# Patient Record
Sex: Male | Born: 1955
Health system: Southern US, Community
[De-identification: ages and names within clinical notes are randomized; demographics above are authoritative.]

## PROBLEM LIST (undated history)

## (undated) DIAGNOSIS — E785 Hyperlipidemia, unspecified: Secondary | ICD-10-CM

## (undated) DIAGNOSIS — C801 Malignant (primary) neoplasm, unspecified: Secondary | ICD-10-CM

## (undated) DIAGNOSIS — I2510 Atherosclerotic heart disease of native coronary artery without angina pectoris: Secondary | ICD-10-CM

## (undated) DIAGNOSIS — K219 Gastro-esophageal reflux disease without esophagitis: Secondary | ICD-10-CM

## (undated) DIAGNOSIS — I10 Essential (primary) hypertension: Secondary | ICD-10-CM

## (undated) DIAGNOSIS — Z720 Tobacco use: Secondary | ICD-10-CM

## (undated) DIAGNOSIS — D500 Iron deficiency anemia secondary to blood loss (chronic): Secondary | ICD-10-CM

## (undated) DIAGNOSIS — I214 Non-ST elevation (NSTEMI) myocardial infarction: Secondary | ICD-10-CM

## (undated) DIAGNOSIS — J45909 Unspecified asthma, uncomplicated: Secondary | ICD-10-CM

## (undated) HISTORY — DX: Tobacco use: Z72.0

## (undated) HISTORY — DX: Gastro-esophageal reflux disease without esophagitis: K21.9

## (undated) HISTORY — DX: Unspecified asthma, uncomplicated: J45.909

## (undated) HISTORY — PX: FOOT FRACTURE SURGERY: SHX645

## (undated) HISTORY — DX: Malignant (primary) neoplasm, unspecified: C80.1

---

## 2006-08-28 ENCOUNTER — Emergency Department: Payer: Self-pay | Admitting: Emergency Medicine

## 2010-12-16 ENCOUNTER — Emergency Department: Payer: Self-pay | Admitting: Emergency Medicine

## 2013-01-16 DEATH — deceased

## 2013-08-18 DEATH — deceased

## 2015-05-28 ENCOUNTER — Other Ambulatory Visit: Payer: Self-pay | Admitting: Otolaryngology

## 2015-05-28 ENCOUNTER — Ambulatory Visit
Admission: RE | Admit: 2015-05-28 | Discharge: 2015-05-28 | Disposition: A | Discharge status: Home / Self Care | Payer: BLUE CROSS/BLUE SHIELD | Source: Ambulatory Visit | Attending: Otolaryngology | Admitting: Otolaryngology

## 2015-05-28 DIAGNOSIS — R042 Hemoptysis: Secondary | ICD-10-CM

## 2015-05-31 ENCOUNTER — Ambulatory Visit: Payer: Self-pay | Admitting: Primary Care

## 2015-05-31 ENCOUNTER — Ambulatory Visit (INDEPENDENT_AMBULATORY_CARE_PROVIDER_SITE_OTHER): Payer: BLUE CROSS/BLUE SHIELD | Admitting: Primary Care

## 2015-05-31 ENCOUNTER — Encounter: Payer: Self-pay | Admitting: Primary Care

## 2015-05-31 VITALS — BP 130/82 | HR 80 | Temp 97.8°F | Ht 73.5 in | Wt 219.0 lb

## 2015-05-31 DIAGNOSIS — R7989 Other specified abnormal findings of blood chemistry: Secondary | ICD-10-CM

## 2015-05-31 DIAGNOSIS — K219 Gastro-esophageal reflux disease without esophagitis: Secondary | ICD-10-CM | POA: Diagnosis not present

## 2015-05-31 DIAGNOSIS — Z23 Encounter for immunization: Secondary | ICD-10-CM | POA: Diagnosis not present

## 2015-05-31 DIAGNOSIS — Z720 Tobacco use: Secondary | ICD-10-CM

## 2015-05-31 DIAGNOSIS — Z4659 Encounter for fitting and adjustment of other gastrointestinal appliance and device: Secondary | ICD-10-CM | POA: Insufficient documentation

## 2015-05-31 DIAGNOSIS — Z0000 Encounter for general adult medical examination without abnormal findings: Secondary | ICD-10-CM | POA: Diagnosis not present

## 2015-05-31 LAB — COMPREHENSIVE METABOLIC PANEL
ALT: 18 U/L (ref 0–53)
AST: 18 U/L (ref 0–37)
Albumin: 4.3 g/dL (ref 3.5–5.2)
Alkaline Phosphatase: 51 U/L (ref 39–117)
BILIRUBIN TOTAL: 0.7 mg/dL (ref 0.2–1.2)
BUN: 11 mg/dL (ref 6–23)
CALCIUM: 9.4 mg/dL (ref 8.4–10.5)
CHLORIDE: 102 meq/L (ref 96–112)
CO2: 27 meq/L (ref 19–32)
Creatinine, Ser: 0.76 mg/dL (ref 0.40–1.50)
GFR: 110.79 mL/min (ref >60.00)
GLUCOSE: 99 mg/dL (ref 70–99)
Potassium: 5 mEq/L (ref 3.5–5.1)
Sodium: 136 mEq/L (ref 135–145)
Total Protein: 7.3 g/dL (ref 6.0–8.3)

## 2015-05-31 LAB — CBC
HCT: 52.1 % — ABNORMAL HIGH (ref 39.0–52.0)
HEMOGLOBIN: 17.2 g/dL — AB (ref 13.0–17.0)
MCHC: 33.1 g/dL (ref 30.0–36.0)
MCV: 94.9 fl (ref 78.0–100.0)
PLATELETS: 244 10*3/uL (ref 150.0–400.0)
RBC: 5.49 Mil/uL (ref 4.22–5.81)
RDW: 14.2 % (ref 11.5–15.5)
WBC: 8.3 10*3/uL (ref 4.0–10.5)

## 2015-05-31 LAB — HEMOGLOBIN A1C: HEMOGLOBIN A1C: 5.8 % (ref 4.6–6.5)

## 2015-05-31 LAB — LIPID PANEL
Cholesterol: 209 mg/dL — ABNORMAL HIGH (ref 0–200)
HDL: 38.9 mg/dL — AB (ref >39.00)
NONHDL: 169.93
TRIGLYCERIDES: 238 mg/dL — AB (ref 0.0–149.0)
Total CHOL/HDL Ratio: 5
VLDL: 47.6 mg/dL — AB (ref 0.0–40.0)

## 2015-05-31 LAB — LDL CHOLESTEROL, DIRECT: LDL DIRECT: 143 mg/dL

## 2015-05-31 LAB — PSA: PSA: 0.69 ng/mL (ref 0.10–4.00)

## 2015-05-31 MED ORDER — INFLUENZA VAC SPLIT QUAD 0.5 ML IM SUSY
0.5000 mL | PREFILLED_SYRINGE | INTRAMUSCULAR | Status: AC
  Administered 2015-05-31: 0.5 mL via INTRAMUSCULAR

## 2015-05-31 NOTE — Assessment & Plan Note (Signed)
Form to be completed for employer. Labs today and pending. Exam unremarkable. Declines Zostavax and Colonoscopy. Flu provided today. Discussed healthy diet and exercise, including increasing water consumption.

## 2015-05-31 NOTE — Progress Notes (Signed)
Subjective:    Patient ID: Lawrence Wallace, male    DOB: Mar 02, 1956, 59 y.o.   MRN: [id number]  HPI  Lawrence Wallace is a 59 year old male who presents today to establish care and discuss the problems mentioned below. He also presents for complete physical including form completion for insurance purposes. Will review old records.  1) GERD: Present for 30+ years. He is managed on Nexium and without medication he will experience esophageal reflux with burning and epigastric pain.  2) Tobacco abuse: Smokes cigarettes. Smoked since age 30. Currently smokes 2 PPD. He's tried quitting "cold [place]" and using nicotine patches. He is ready to quit smoking and is interested in Chantix. He would like to wait to start Chantix until he's started acupuncture.   Immunizations: -Tetanus: Completed within 10 years. -Influenza: Due, will provide today -Shingles: Has never completed, declines today.  Diet: Endorses fair diet. Breakfast: Toast, tomato, bacon, fast food sometimes Lunch: Sandwich Dinner: Vegetables, lean meat, sometimes rice, potatoes. Beverages: Water ( 4 bottles of water), coffee Desserts: Will eat if around the house.  Exercise: Does not routinely exercise, is active with occupation Eye exam: Completed 6 months ago, slight change in vision Dental exam: Due for cleaning. Work done 2 months ago. Colonoscopy: Has never completed. Declines.    Review of Systems  Constitutional: Negative for unexpected weight change.  HENT: Negative for rhinorrhea.   Respiratory: Negative for cough and shortness of breath.   Cardiovascular: Negative for chest pain.  Gastrointestinal: Negative for diarrhea and constipation.  Genitourinary: Negative for difficulty urinating.  Musculoskeletal:       Chronic knee and foot pain. Occasional calf cramping  Skin: Negative for rash.  Neurological: Negative for dizziness, numbness and headaches.  Psychiatric/Behavioral:       Denies concerns for anxiety or  depression       Past Medical History  Diagnosis Date  . Asthma   . Cancer   . GERD (gastroesophageal reflux disease)     Social History   Social History  . Marital Status: Married    Spouse Name: N/A  . Number of Children: N/A  . Years of Education: N/A   Occupational History  . Not on file.   Social History Main Topics  . Smoking status: Current Every Day Smoker  . Smokeless tobacco: Not on file  . Alcohol Use: 0.0 oz/week    0 Standard drinks or equivalent per week  . Drug Use: Not on file  . Sexual Activity: Not on file   Other Topics Concern  . Not on file   Social History Narrative  . No narrative on file    Past Surgical History  Procedure Laterality Date  . Foot fracture surgery      Family History  Problem Relation Age of Onset  . Stroke Mother   . Cancer Mother   . Cancer Father     No Known Allergies  No current outpatient prescriptions on file prior to visit.   No current facility-administered medications on file prior to visit.    BP 130/82 mmHg  Pulse 80  Temp(Src) 97.8 F (36.6 C) (Oral)  Ht 6' 1.5" (1.867 m)  Wt 219 lb (99.338 kg)  BMI 28.50 kg/m2  SpO2 96%    Objective:   Physical Exam  Constitutional: He is oriented to person, place, and time. He appears well-nourished.  HENT:  Right Ear: Tympanic membrane and ear canal normal.  Left Ear: Tympanic membrane and ear canal normal.  Nose: Nose normal.  Mouth/Throat: Oropharynx is clear and moist.  Eyes: Conjunctivae and EOM are normal. Pupils are equal, round, and reactive to light.  Neck: Neck supple. No thyromegaly present.  Cardiovascular: Normal rate and regular rhythm.   Pulmonary/Chest: Effort normal and breath sounds normal.  Abdominal: Soft. Bowel sounds are normal. There is no tenderness.  Musculoskeletal: Normal range of motion.  Neurological: He is alert and oriented to person, place, and time. He has normal reflexes. No cranial nerve deficit.  Skin: Skin is  warm and dry.  Psychiatric: He has a normal mood and affect.          Assessment & Plan:

## 2015-05-31 NOTE — Progress Notes (Signed)
Pre visit review using our clinic review tool, if applicable. No additional management support is needed unless otherwise documented below in the visit note. 

## 2015-05-31 NOTE — Assessment & Plan Note (Signed)
Smokes 2 PPD currently. Smoking cigarettes since age 59. Ready to quit and is interested in Chantix but will notify me when he's ready for the RX as he will be doing acupuncture simultaneously.  Will continue to monitor.

## 2015-05-31 NOTE — Patient Instructions (Signed)
Complete lab work prior to leaving today. I will notify you of your results.  We will complete your form and notify you once the labs have resulted.   Increase consumption of water to 2 liters daily.   Please notify me once you're ready to start Chantix.  It was a pleasure to meet you today! Please don't hesitate to call me with any questions. Welcome to [facility]!

## 2015-05-31 NOTE — Assessment & Plan Note (Signed)
Present for 30+ years. Currently managed on nexium and is dependant on medication. Will continue to monitor.

## 2015-09-17 ENCOUNTER — Ambulatory Visit: Payer: BLUE CROSS/BLUE SHIELD | Admitting: Primary Care

## 2016-02-29 ENCOUNTER — Telehealth: Payer: Self-pay

## 2016-02-29 NOTE — Telephone Encounter (Signed)
Pt walked in; pt fell 02/24/16 but pt does not think hit chest in fall; on 02/26/16 pt noticed rt sided chest pain upon movement; pt moved furniture on 02/27/16 and rt sided chest pain upon movement worsened. Pt worked this morning but is off this afternoon. Pt has no pain when not moving; when pt moves the rt arm has rt sided cp. Does not radiate into neck or arm. No N&V,sweating or difficulty breathing. Pt is not in pain now. Pt is not in any distress. Pt only takes Nexium. No available appts at [facility] or [facility]. Pt does not want to go to UC. Pt scheduled appt with Dr Damita Dunnings on 03/01/16 at 2 pm. If pt condition changes or worsens prior to appt pt will go to UC. FYI to Dr Damita Dunnings.

## 2016-02-29 NOTE — Telephone Encounter (Signed)
Reasonable to try tylenol (and an ice pack if a locally sore place is noted).  Thanks.

## 2016-02-29 NOTE — Telephone Encounter (Signed)
Left detailed message on voicemail.  

## 2016-03-01 ENCOUNTER — Encounter: Payer: Self-pay | Admitting: Family Medicine

## 2016-03-01 ENCOUNTER — Ambulatory Visit (INDEPENDENT_AMBULATORY_CARE_PROVIDER_SITE_OTHER): Payer: BLUE CROSS/BLUE SHIELD | Admitting: Family Medicine

## 2016-03-01 VITALS — BP 124/80 | HR 90 | Temp 98.6°F | Ht 73.5 in | Wt 219.5 lb

## 2016-03-01 DIAGNOSIS — R0789 Other chest pain: Secondary | ICD-10-CM

## 2016-03-01 MED ORDER — TRAMADOL HCL 50 MG PO TABS: 50.0000 mg | ORAL_TABLET | Freq: Two times a day (BID) | ORAL | Status: DC | PRN

## 2016-03-01 NOTE — Progress Notes (Signed)
Pre visit review using our clinic review tool, if applicable. No additional management support is needed unless otherwise documented below in the visit note.  He is considering acupuncture to quit smoking.  He didn't tolerate the gum prev.    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  He was doing a lot of lifting.  Since then with R sided chest wall pain.  He didn't recall an injury.  Pain with sneezing.  No trauma. Pain with a cough.  No L sided pain.  Only with pain at the R pec area.  Pain is constant at baseline and worse with a cough.  Not SOB.  Taking ibuprofen didn't help.  Back isn't sore.  Feels well o/w.  He has position pain with reaching down and forward.   He is better now than 2 days ago.   Meds, vitals, and allergies reviewed.   ROS: Per HPI unless specifically indicated in ROS section   GEN: nad, alert and oriented NECK: supple w/o LA, normal ROM CV: rrr.  R chest wall ttp w/o bruising.   PULM: ctab, no inc wob EXT: no edema SKIN:no bruise

## 2016-03-01 NOTE — Patient Instructions (Addendum)
Use the tramadol as needed, on top of the ibuprofen (with food). Tramadol can make you drowsy.  Should gradually get better.  Go easy lifting.  Take care.  Glad to see you.

## 2016-03-01 NOTE — Assessment & Plan Note (Signed)
See AVS.  Likely chest wall/pec strain.  Tramadol prn, he can hold rx for now.  No sign of intrathoracic issues.   He's working to stop smoking, incidentally d/w pt.   Update me prn.

## 2016-06-01 DIAGNOSIS — D225 Melanocytic nevi of trunk: Secondary | ICD-10-CM | POA: Diagnosis not present

## 2016-06-01 DIAGNOSIS — L570 Actinic keratosis: Secondary | ICD-10-CM | POA: Diagnosis not present

## 2016-06-29 ENCOUNTER — Ambulatory Visit (INDEPENDENT_AMBULATORY_CARE_PROVIDER_SITE_OTHER): Payer: BLUE CROSS/BLUE SHIELD | Admitting: Primary Care

## 2016-06-29 ENCOUNTER — Encounter: Payer: Self-pay | Admitting: Primary Care

## 2016-06-29 VITALS — BP 138/82 | HR 82 | Temp 98.2°F | Ht 74.0 in | Wt 218.2 lb

## 2016-06-29 DIAGNOSIS — Z0000 Encounter for general adult medical examination without abnormal findings: Secondary | ICD-10-CM | POA: Diagnosis not present

## 2016-06-29 DIAGNOSIS — E785 Hyperlipidemia, unspecified: Secondary | ICD-10-CM | POA: Diagnosis not present

## 2016-06-29 DIAGNOSIS — Z23 Encounter for immunization: Secondary | ICD-10-CM | POA: Diagnosis not present

## 2016-06-29 DIAGNOSIS — R7303 Prediabetes: Secondary | ICD-10-CM

## 2016-06-29 DIAGNOSIS — K219 Gastro-esophageal reflux disease without esophagitis: Secondary | ICD-10-CM | POA: Diagnosis not present

## 2016-06-29 LAB — COMPREHENSIVE METABOLIC PANEL
ALK PHOS: 51 U/L (ref 39–117)
ALT: 20 U/L (ref 0–53)
AST: 20 U/L (ref 0–37)
Albumin: 4 g/dL (ref 3.5–5.2)
BILIRUBIN TOTAL: 0.8 mg/dL (ref 0.2–1.2)
BUN: 14 mg/dL (ref 6–23)
CO2: 28 mEq/L (ref 19–32)
Calcium: 9.4 mg/dL (ref 8.4–10.5)
Chloride: 104 mEq/L (ref 96–112)
Creatinine, Ser: 0.93 mg/dL (ref 0.40–1.50)
GFR: 87.46 mL/min (ref >60.00)
GLUCOSE: 108 mg/dL — AB (ref 70–99)
Potassium: 4.8 mEq/L (ref 3.5–5.1)
SODIUM: 137 meq/L (ref 135–145)
TOTAL PROTEIN: 7.4 g/dL (ref 6.0–8.3)

## 2016-06-29 LAB — LDL CHOLESTEROL, DIRECT: LDL DIRECT: 129 mg/dL

## 2016-06-29 LAB — LIPID PANEL
CHOL/HDL RATIO: 5
Cholesterol: 195 mg/dL (ref 0–200)
HDL: 36.6 mg/dL — AB (ref >39.00)
NONHDL: 158.32
Triglycerides: 291 mg/dL — ABNORMAL HIGH (ref 0.0–149.0)
VLDL: 58.2 mg/dL — ABNORMAL HIGH (ref 0.0–40.0)

## 2016-06-29 LAB — HEMOGLOBIN A1C: Hgb A1c MFr Bld: 5.7 % (ref 4.6–6.5)

## 2016-06-29 NOTE — Assessment & Plan Note (Signed)
Td UTD, influenza provided today, declines Zostavax. Information provided regarding Cologuard. Discussed the importance of a healthy diet and regular exercise in order for weight loss, and to reduce the risk of other medical diseases. Exam unremarkable. Labs pending. Follow up in 1 year for repeat physical. Form to be completed and faxed to employer.

## 2016-06-29 NOTE — Progress Notes (Signed)
Subjective:    Patient ID: Lawrence Wallace, male    DOB: 1956/03/11, 60 y.o.   MRN: [id number]  HPI  Lawrence Wallace is a 60 year old male who presents today for complete physical.  Immunizations: -Tetanus: Completed within the last 10 years. -Influenza: Completed last season, due today. -Shingles: Declines.  Diet: He endorses a healthy diet. Breakfast: Cereal, oatmeal, fast food (1-2 times weekly) Lunch: Sandwich Dinner: Beans, vegetables, sandwich, meat Snacks: Nuts, crackers Desserts: Occasionally  Beverages: Water, coffee, soda, alcohol daily  Exercise: He is active with work, does not routinely exercise. Eye exam: Completed 1 year ago. Dental exam: Completes 1-2 times annually. Colonoscopy: Never completed. He is interested in cologuard. PSA: Completed in 2016, normal.   Review of Systems  Constitutional: Negative for unexpected weight change.  HENT: Negative for rhinorrhea.   Respiratory: Negative for cough and shortness of breath.   Cardiovascular: Negative for chest pain.  Gastrointestinal: Negative for constipation and diarrhea.  Genitourinary: Negative for difficulty urinating.  Musculoskeletal: Positive for arthralgias. Negative for myalgias.  Skin: Negative for rash.  Allergic/Immunologic: Positive for environmental allergies.  Neurological: Negative for dizziness, numbness and headaches.  Psychiatric/Behavioral:       Denies concerns for anxiety and depression       Past Medical History:  Diagnosis Date  . Asthma   . Cancer ([place])   . GERD (gastroesophageal reflux disease)   . Tobacco abuse      Social History   Social History  . Marital status: Married    Spouse name: N/A  . Number of children: N/A  . Years of education: N/A   Occupational History  . Not on file.   Social History Main Topics  . Smoking status: Current Every Day Smoker    Packs/day: 1.50    Years: 45.00    Types: Cigarettes  . Smokeless tobacco: Never Used  . Alcohol use 0.0  oz/week  . Drug use: Unknown  . Sexual activity: Not on file   Other Topics Concern  . Not on file   Social History Narrative  . No narrative on file    Past Surgical History:  Procedure Laterality Date  . FOOT FRACTURE SURGERY      Family History  Problem Relation Age of Onset  . Stroke Mother   . Cancer Mother   . Cancer Father     No Known Allergies  Current Outpatient Prescriptions on File Prior to Visit  Medication Sig Dispense Refill  . esomeprazole (NEXIUM) 20 MG capsule Take 20 mg by mouth daily at 12 noon.    . Multiple Vitamin (MULTIVITAMIN) tablet Take 1 tablet by mouth daily.     No current facility-administered medications on file prior to visit.     BP 138/82   Pulse 82   Temp 98.2 F (36.8 C) (Oral)   Ht 6\' 2"  (1.88 m)   Wt 218 lb 2.9 oz (99 kg)   SpO2 96%   BMI 28.01 kg/m    Objective:   Physical Exam  Constitutional: He is oriented to person, place, and time. He appears well-nourished.  HENT:  Right Ear: Tympanic membrane and ear canal normal.  Left Ear: Tympanic membrane and ear canal normal.  Nose: Nose normal. Right sinus exhibits no maxillary sinus tenderness and no frontal sinus tenderness. Left sinus exhibits no maxillary sinus tenderness and no frontal sinus tenderness.  Mouth/Throat: Oropharynx is clear and moist.  Eyes: Conjunctivae and EOM are normal. Pupils are equal, round,  and reactive to light.  Neck: Neck supple. Carotid bruit is not present. No thyromegaly present.  Cardiovascular: Normal rate, regular rhythm and normal heart sounds.   Pulmonary/Chest: Effort normal and breath sounds normal. He has no wheezes. He has no rales.  Abdominal: Soft. Bowel sounds are normal. There is no tenderness.  Musculoskeletal: Normal range of motion.  Neurological: He is alert and oriented to person, place, and time. He has normal reflexes. No cranial nerve deficit.  Skin: Skin is warm and dry.  Psychiatric: He has a normal mood and affect.            Assessment & Plan:

## 2016-06-29 NOTE — Assessment & Plan Note (Signed)
Stable and is aware of long term effects of PPI use. Dependant on medication.

## 2016-06-29 NOTE — Progress Notes (Signed)
Pre visit review using our clinic review tool, if applicable. No additional management support is needed unless otherwise documented below in the visit note. 

## 2016-06-29 NOTE — Addendum Note (Signed)
Addended by: Jacqualin Combes on: 06/29/2016 09:06 AM   Modules accepted: Orders

## 2016-06-29 NOTE — Patient Instructions (Addendum)
Complete lab work prior to leaving today. I will notify you of your results once received.   Please check with your insurance regarding coverage for Cologuard for colon cancer screening.  It's importance to improve your diet by reducing consumption of fast food, fried food, processed snack foods, sugary drinks. Increase consumption of fresh vegetables and fruits, whole grains, water.  Ensure you are drinking 64 ounces of water daily..  Start exercising. You should be getting 150 minutes of moderate intensity exercise weekly.  Follow up in 1 year for repeat physical or sooner if needed.  It was a pleasure to see you today!

## 2016-06-30 ENCOUNTER — Telehealth: Payer: Self-pay | Admitting: Primary Care

## 2016-06-30 NOTE — Telephone Encounter (Signed)
Pt dropped off provider screening form  In rx tower for [facility]

## 2016-06-30 NOTE — Telephone Encounter (Signed)
Completed and placed in Chans inbox. 

## 2016-06-30 NOTE — Telephone Encounter (Signed)
Pace form in [employer].

## 2016-06-30 NOTE — Telephone Encounter (Signed)
Spoken and notified patient of Kate's comments. Patient verbalized understanding.  Will scan form into chart.

## 2016-07-03 ENCOUNTER — Encounter: Payer: Self-pay | Admitting: *Deleted

## 2016-12-01 DIAGNOSIS — H524 Presbyopia: Secondary | ICD-10-CM | POA: Diagnosis not present

## 2016-12-01 DIAGNOSIS — H25813 Combined forms of age-related cataract, bilateral: Secondary | ICD-10-CM | POA: Diagnosis not present

## 2017-03-13 ENCOUNTER — Encounter: Payer: Self-pay | Admitting: Primary Care

## 2017-03-13 ENCOUNTER — Ambulatory Visit (INDEPENDENT_AMBULATORY_CARE_PROVIDER_SITE_OTHER): Payer: BLUE CROSS/BLUE SHIELD | Admitting: Primary Care

## 2017-03-13 VITALS — BP 136/86 | HR 82 | Temp 98.0°F | Ht 73.5 in | Wt 224.4 lb

## 2017-03-13 DIAGNOSIS — M79605 Pain in left leg: Secondary | ICD-10-CM

## 2017-03-13 DIAGNOSIS — H6690 Otitis media, unspecified, unspecified ear: Secondary | ICD-10-CM

## 2017-03-13 DIAGNOSIS — M79604 Pain in right leg: Secondary | ICD-10-CM

## 2017-03-13 DIAGNOSIS — M79606 Pain in leg, unspecified: Secondary | ICD-10-CM | POA: Insufficient documentation

## 2017-03-13 MED ORDER — AMOXICILLIN-POT CLAVULANATE 875-125 MG PO TABS: 1.0000 | ORAL_TABLET | Freq: Two times a day (BID) | ORAL | 0 refills | Status: DC

## 2017-03-13 NOTE — Assessment & Plan Note (Signed)
To bilateral calves for years. No worse recently.  Could be nerve vs vascular involvement given history of tobacco abuse. Could be MSK involvement from years of climbing ladders. Offered work up today including BMP and ABI's for which he declines. He will call when ready. No suspicion for acute DVT.

## 2017-03-13 NOTE — Progress Notes (Signed)
Subjective:    Patient ID: Lawrence Wallace, male    DOB: [birth year], 61 y.o.   MRN: [id number]  HPI  Lawrence Wallace is a 61 year old male who presents today with a chief complaint of ear pain. His pain is located to the right ear that began three weeks ago. He was using a q-tip three weeks ago, sneezed, and stuck his ear. His pain has been intermittent with a dull/achy pain. He's been taking Ibuprofen with some improvement. He denies fevers, chills, cough.   2) Extremity Pain: He's also noticed intermittent bilateral lower calf pain that has been present for years. He will wake during the night with pain. He also notices the pain during the day when resting, not when walking. He denies calf swelling. 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   Review of Systems  Constitutional: Negative for chills and fever.  HENT: Positive for ear pain. Negative for congestion and sinus pressure.   Respiratory: Negative for cough.   Musculoskeletal:       Lower extremity calf pain       Past Medical History:  Diagnosis Date  . Asthma   . Cancer ([place])   . GERD (gastroesophageal reflux disease)   . Tobacco abuse      Social History   Social History  . Marital status: Married    Spouse name: N/A  . Number of children: N/A  . Years of education: N/A   Occupational History  . Not on file.   Social History Main Topics  . Smoking status: Current Every Day Smoker    Packs/day: 1.50    Years: 45.00    Types: Cigarettes  . Smokeless tobacco: Never Used  . Alcohol use 0.0 oz/week  . Drug use: Unknown  . Sexual activity: Not on file   Other Topics Concern  . Not on file   Social History Narrative  . No narrative on file    Past Surgical History:  Procedure Laterality Date  . FOOT FRACTURE SURGERY      Family History  Problem Relation Age of Onset  . Stroke Mother   . Cancer Mother   . Cancer Father     No Known Allergies  Current Outpatient Prescriptions on File Prior  to Visit  Medication Sig Dispense Refill  . esomeprazole (NEXIUM) 20 MG capsule Take 20 mg by mouth daily at 12 noon.    . Multiple Vitamin (MULTIVITAMIN) tablet Take 1 tablet by mouth daily.     No current facility-administered medications on file prior to visit.     BP 136/86   Pulse 82   Temp 98 F (36.7 C) (Oral)   Ht 6' 1.5" (1.867 m)   Wt 224 lb 6.4 oz (101.8 kg)   SpO2 95%   BMI 29.20 kg/m    Objective:   Physical Exam  Constitutional: He appears well-nourished. He does not appear ill.  HENT:  Left Ear: Tympanic membrane and ear canal normal.  Nose: No mucosal edema. Right sinus exhibits no maxillary sinus tenderness and no frontal sinus tenderness. Left sinus exhibits no maxillary sinus tenderness and no frontal sinus tenderness.  Mouth/Throat: Oropharynx is clear and moist.  Moderate amount of puss buildup in right ear. No visualization of tympanic membrane.   Eyes: Conjunctivae are normal.  Neck: Neck supple.  Cardiovascular: Normal rate and regular rhythm.   Pulses:      Dorsalis pedis pulses are 2+ on the right side,  and 2+ on the left side.       Posterior tibial pulses are 2+ on the right side, and 2+ on the left side.  Pulmonary/Chest: Effort normal and breath sounds normal. He has no wheezes. He has no rales.  Skin: Skin is warm and dry.  No calf swelling or erythema          Assessment & Plan:  Acute Otitis Media:  Otalgia to right ear x 3 weeks. Exam today with evidence of ruptured TM with otitis media. Rx for Augmentin course sent to pharmacy. Discussed to refrain from q-tip use. Continue with Ibuprofen PRN.  Sheral Flow, NP

## 2017-03-13 NOTE — Patient Instructions (Signed)
Start Augmentin antibiotics. Take 1 tablet by mouth twice daily for 10 days.  Avoid using q-tips in your ears.  Continue Ibuprofen as needed for pain and inflammation.  It was a pleasure to see you today!

## 2017-06-15 DIAGNOSIS — Z713 Dietary counseling and surveillance: Secondary | ICD-10-CM | POA: Diagnosis not present

## 2017-06-15 DIAGNOSIS — E785 Hyperlipidemia, unspecified: Secondary | ICD-10-CM | POA: Diagnosis not present

## 2017-06-15 DIAGNOSIS — Z131 Encounter for screening for diabetes mellitus: Secondary | ICD-10-CM | POA: Diagnosis not present

## 2017-06-15 DIAGNOSIS — Z23 Encounter for immunization: Secondary | ICD-10-CM | POA: Diagnosis not present

## 2017-06-15 DIAGNOSIS — Z136 Encounter for screening for cardiovascular disorders: Secondary | ICD-10-CM | POA: Diagnosis not present

## 2017-06-15 DIAGNOSIS — Z13220 Encounter for screening for lipoid disorders: Secondary | ICD-10-CM | POA: Diagnosis not present

## 2017-07-28 DIAGNOSIS — H7291 Unspecified perforation of tympanic membrane, right ear: Secondary | ICD-10-CM | POA: Diagnosis not present

## 2017-07-28 DIAGNOSIS — R030 Elevated blood-pressure reading, without diagnosis of hypertension: Secondary | ICD-10-CM | POA: Diagnosis not present

## 2017-11-25 DIAGNOSIS — J069 Acute upper respiratory infection, unspecified: Secondary | ICD-10-CM | POA: Diagnosis not present

## 2017-12-10 ENCOUNTER — Ambulatory Visit: Payer: BLUE CROSS/BLUE SHIELD | Admitting: Primary Care

## 2017-12-10 ENCOUNTER — Encounter: Payer: Self-pay | Admitting: Primary Care

## 2017-12-10 DIAGNOSIS — M79605 Pain in left leg: Secondary | ICD-10-CM

## 2017-12-10 DIAGNOSIS — M79604 Pain in right leg: Secondary | ICD-10-CM | POA: Diagnosis not present

## 2017-12-10 LAB — LIPID PANEL
CHOLESTEROL: 184 mg/dL (ref 0–200)
HDL: 38.3 mg/dL — AB (ref >39.00)
NonHDL: 145.46
Total CHOL/HDL Ratio: 5
Triglycerides: 289 mg/dL — ABNORMAL HIGH (ref 0.0–149.0)
VLDL: 57.8 mg/dL — ABNORMAL HIGH (ref 0.0–40.0)

## 2017-12-10 LAB — CBC
HEMATOCRIT: 50.2 % (ref 39.0–52.0)
Hemoglobin: 17.1 g/dL — ABNORMAL HIGH (ref 13.0–17.0)
MCHC: 34 g/dL (ref 30.0–36.0)
MCV: 95.1 fl (ref 78.0–100.0)
Platelets: 243 10*3/uL (ref 150.0–400.0)
RBC: 5.28 Mil/uL (ref 4.22–5.81)
RDW: 13.4 % (ref 11.5–15.5)
WBC: 6.6 10*3/uL (ref 4.0–10.5)

## 2017-12-10 LAB — COMPREHENSIVE METABOLIC PANEL
ALBUMIN: 3.9 g/dL (ref 3.5–5.2)
ALK PHOS: 52 U/L (ref 39–117)
ALT: 16 U/L (ref 0–53)
AST: 17 U/L (ref 0–37)
BILIRUBIN TOTAL: 1 mg/dL (ref 0.2–1.2)
BUN: 13 mg/dL (ref 6–23)
CALCIUM: 9.2 mg/dL (ref 8.4–10.5)
CO2: 27 mEq/L (ref 19–32)
Chloride: 104 mEq/L (ref 96–112)
Creatinine, Ser: 0.78 mg/dL (ref 0.40–1.50)
GFR: 106.64 mL/min (ref >60.00)
GLUCOSE: 105 mg/dL — AB (ref 70–99)
Potassium: 4.3 mEq/L (ref 3.5–5.1)
Sodium: 137 mEq/L (ref 135–145)
TOTAL PROTEIN: 7.5 g/dL (ref 6.0–8.3)

## 2017-12-10 LAB — LDL CHOLESTEROL, DIRECT: LDL DIRECT: 125 mg/dL

## 2017-12-10 LAB — HEMOGLOBIN A1C: Hgb A1c MFr Bld: 5.9 % (ref 4.6–6.5)

## 2017-12-10 LAB — VITAMIN B12: VITAMIN B 12: 216 pg/mL (ref 211–911)

## 2017-12-10 NOTE — Patient Instructions (Signed)
You will be contacted regarding your ultrasound of the legs.  Please let us know if you have not been contacted within one week.   Stop by the lab prior to leaving today. I will notify you of your results once received.   Start stretching every morning when waking and also before bed.  Continue taking a multi-vitamin daily.  It was a pleasure to see you today!

## 2017-12-10 NOTE — Assessment & Plan Note (Signed)
Continued and progressed over the last several days. No suspicion for DVT. Check ABI's given decreased PT pulses and tobacco abuse history. Check labs today including lipids, CMP, B12.   Discussed to start stretching every morning and at bedtime. Continue multi-vitamin.

## 2017-12-10 NOTE — Progress Notes (Signed)
Subjective:    Patient ID: Lawrence Wallace, male    DOB: Jan 14, 1956, 62 y.o.   MRN: [id number]  HPI  Lawrence Wallace is a 62 year old male with a history of tobacco abuse who presents today with a chief complaint of lower extremity cramping.   His cramping is located to the bilateral lower extremities starting from the posterior and lateral calves down to ankles that has been present for several years. Over the last several days this has progressed. His cramping is present during rest and with ambulation. He is very active on his feet, climbing stairs and ladders. He denies changes in activity level, chest pain, shortness of breath, injury. He's taking Ibuprofen, rubbing "Real Time" with temporary improvement. He does take a daily multi-vitamin.   Review of Systems  Respiratory: Negative for shortness of breath.   Cardiovascular: Negative for chest pain.  Musculoskeletal: Positive for myalgias.  Skin: Negative for color change.  Neurological: Negative for numbness.       Past Medical History:  Diagnosis Date  . Asthma   . Cancer ([place])   . GERD (gastroesophageal reflux disease)   . Tobacco abuse      Social History   Socioeconomic History  . Marital status: Married    Spouse name: Not on file  . Number of children: Not on file  . Years of education: Not on file  . Highest education level: Not on file  Occupational History  . Not on file  Social Needs  . Financial resource strain: Not on file  . Food insecurity:    Worry: Not on file    Inability: Not on file  . Transportation needs:    Medical: Not on file    Non-medical: Not on file  Tobacco Use  . Smoking status: Current Every Day Smoker    Packs/day: 1.50    Years: 45.00    Pack years: 67.50    Types: Cigarettes  . Smokeless tobacco: Never Used  Substance and Sexual Activity  . Alcohol use: Yes    Alcohol/week: 0.0 oz  . Drug use: Not on file  . Sexual activity: Not on file  Lifestyle  . Physical activity:    Days  per week: Not on file    Minutes per session: Not on file  . Stress: Not on file  Relationships  . Social connections:    Talks on phone: Not on file    Gets together: Not on file    Attends religious service: Not on file    Active member of club or organization: Not on file    Attends meetings of clubs or organizations: Not on file    Relationship status: Not on file  . Intimate partner violence:    Fear of current or ex partner: Not on file    Emotionally abused: Not on file    Physically abused: Not on file    Forced sexual activity: Not on file  Other Topics Concern  . Not on file  Social History Narrative  . Not on file      Family History  Problem Relation Age of Onset  . Stroke Mother   . Cancer Mother   . Cancer Father     No Known Allergies  Current Outpatient Medications on File Prior to Visit  Medication Sig Dispense Refill  . esomeprazole (NEXIUM) 20 MG capsule Take 20 mg by mouth daily at 12 noon.    . Multiple Vitamin (MULTIVITAMIN) tablet Take 1  tablet by mouth daily.     No current facility-administered medications on file prior to visit.     BP 136/78   Pulse 79   Temp 98.1 F (36.7 C) (Oral)   Ht 6' 1.5" (1.867 m)   Wt 220 lb (99.8 kg)   SpO2 95%   BMI 28.63 kg/m    Objective:   Physical Exam  Constitutional: He appears well-nourished.  Neck: Neck supple.  Cardiovascular: Normal rate and regular rhythm.  Pulses:      Dorsalis pedis pulses are 2+ on the right side, and 2+ on the left side.       Posterior tibial pulses are 1+ on the right side, and 1+ on the left side.  Pulmonary/Chest: Effort normal and breath sounds normal.  Skin: Skin is warm and dry. No erythema.          Assessment & Plan:

## 2017-12-20 ENCOUNTER — Ambulatory Visit (HOSPITAL_COMMUNITY)
Admission: RE | Admit: 2017-12-20 | Discharge: 2017-12-20 | Disposition: A | Discharge status: Home / Self Care | Payer: BLUE CROSS/BLUE SHIELD | Source: Ambulatory Visit | Attending: Cardiovascular Disease | Admitting: Cardiovascular Disease

## 2017-12-20 DIAGNOSIS — M79604 Pain in right leg: Secondary | ICD-10-CM | POA: Insufficient documentation

## 2017-12-20 DIAGNOSIS — F17200 Nicotine dependence, unspecified, uncomplicated: Secondary | ICD-10-CM | POA: Insufficient documentation

## 2017-12-20 DIAGNOSIS — M79605 Pain in left leg: Secondary | ICD-10-CM | POA: Diagnosis not present

## 2018-05-06 DIAGNOSIS — D229 Melanocytic nevi, unspecified: Secondary | ICD-10-CM | POA: Diagnosis not present

## 2018-05-06 DIAGNOSIS — L821 Other seborrheic keratosis: Secondary | ICD-10-CM | POA: Diagnosis not present

## 2018-05-06 DIAGNOSIS — L570 Actinic keratosis: Secondary | ICD-10-CM | POA: Diagnosis not present

## 2018-05-06 DIAGNOSIS — Z85828 Personal history of other malignant neoplasm of skin: Secondary | ICD-10-CM | POA: Diagnosis not present

## 2018-05-29 ENCOUNTER — Encounter: Payer: Self-pay | Admitting: Family Medicine

## 2018-05-29 ENCOUNTER — Ambulatory Visit: Payer: BLUE CROSS/BLUE SHIELD | Admitting: Family Medicine

## 2018-05-29 VITALS — BP 142/88 | HR 86 | Temp 97.7°F | Ht 73.5 in | Wt 220.0 lb

## 2018-05-29 DIAGNOSIS — B9789 Other viral agents as the cause of diseases classified elsewhere: Secondary | ICD-10-CM

## 2018-05-29 DIAGNOSIS — J069 Acute upper respiratory infection, unspecified: Secondary | ICD-10-CM

## 2018-05-29 DIAGNOSIS — Z720 Tobacco use: Secondary | ICD-10-CM | POA: Diagnosis not present

## 2018-05-29 MED ORDER — PROMETHAZINE-DM 6.25-15 MG/5ML PO SYRP: 5.0000 mL | ORAL_SOLUTION | Freq: Every evening | ORAL | 0 refills | Status: DC | PRN

## 2018-05-29 MED ORDER — BENZONATATE 200 MG PO CAPS: 200.0000 mg | ORAL_CAPSULE | Freq: Three times a day (TID) | ORAL | 1 refills | Status: DC | PRN

## 2018-05-29 NOTE — Progress Notes (Signed)
Subjective:    Patient ID: Lawrence Wallace, male    DOB: 1956-04-05, 62 y.o.   MRN: [id number]  HPI 62 yo pt of NP Clark here with uri symptoms and body aches   Nasal congestion - started Tuesday / clear nasal d/c   Cough - started Monday am / congested cough , with clear phlegm  occ wheezing at night (worse lying down)   Temp: 97.7 F (36.5 C)  No fever  Has had body aches all over  Felt cold one night   Ears -ok  Throat - sore the first few days/ improved now   No known sick contacts but has been around the public   Takes nyquil to sleep  Current smoker About 1 1/2 ppd  Pulse ox 97% on RA  Patient Active Problem List   Diagnosis Date Noted  . Viral URI with cough 05/29/2018  . Lower extremity pain 03/13/2017  . Chest wall pain 03/01/2016  . GERD (gastroesophageal reflux disease) 05/31/2015  . Preventative health care 05/31/2015  . Tobacco abuse 05/31/2015   Past Medical History:  Diagnosis Date  . Asthma   . Cancer ([place])   . GERD (gastroesophageal reflux disease)   . Tobacco abuse    Past Surgical History:  Procedure Laterality Date  . FOOT FRACTURE SURGERY     Social History   Tobacco Use  . Smoking status: Current Every Day Smoker    Packs/day: 1.50    Years: 45.00    Pack years: 67.50    Types: Cigarettes  . Smokeless tobacco: Never Used  Substance Use Topics  . Alcohol use: Yes    Alcohol/week: 0.0 standard drinks    Comment: occ  . Drug use: Not on file   Family History  Problem Relation Age of Onset  . Stroke Mother   . Cancer Mother   . Cancer Father    No Known Allergies Current Outpatient Medications on File Prior to Visit  Medication Sig Dispense Refill  . Cyanocobalamin (B-12) 1000 MCG CAPS Take 1 capsule by mouth daily.    . Multiple Vitamin (MULTIVITAMIN) tablet Take 1 tablet by mouth daily.     No current facility-administered medications on file prior to visit.      Review of Systems  Constitutional: Positive for appetite  change and fatigue. Negative for fever.  HENT: Positive for congestion, postnasal drip, rhinorrhea, sinus pressure, sneezing and sore throat. Negative for ear pain.   Eyes: Negative for pain and discharge.  Respiratory: Positive for cough and wheezing. Negative for shortness of breath and stridor.   Cardiovascular: Negative for chest pain.  Gastrointestinal: Negative for diarrhea, nausea and vomiting.  Genitourinary: Negative for frequency, hematuria and urgency.  Musculoskeletal: Negative for arthralgias and myalgias.  Skin: Negative for rash.  Neurological: Positive for headaches. Negative for dizziness, weakness and light-headedness.  Psychiatric/Behavioral: Negative for confusion and dysphoric mood.       Objective:   Physical Exam  Constitutional: He appears well-developed and well-nourished. No distress.  overwt and well app  HENT:  Head: Normocephalic and atraumatic.  Right Ear: External ear normal.  Left Ear: External ear normal.  Mouth/Throat: Oropharynx is clear and moist.  Nares are injected and congested  No sinus tenderness Clear rhinorrhea and post nasal drip   Eyes: Pupils are equal, round, and reactive to light. Conjunctivae and EOM are normal. Right eye exhibits no discharge. Left eye exhibits no discharge.  Neck: Normal range of motion. Neck supple.  Cardiovascular:  Normal rate and normal heart sounds.  Pulmonary/Chest: Effort normal and breath sounds normal. No stridor. No respiratory distress. He has no wheezes. He has no rales. He exhibits no tenderness.  Diffusely distant bs Good air exch No wheeze even on forced expiration  No rales or rhonchi  Lymphadenopathy:    He has no cervical adenopathy.  Neurological: He is alert. No cranial nerve deficit.  Skin: Skin is warm and dry. No rash noted.  Psychiatric: He has a normal mood and affect.  Pleasant           Assessment & Plan:   Problem List Items Addressed This Visit      Respiratory   Viral  URI with cough - Primary    Reassuring exam / also a smoker  No reactive airways on exam  Disc symptomatic care - see instructions on AVS  Px tessalon for cough tid  prometh -DM for bed time with caution of sedation Update if not starting to improve in a week or if worsening    Handout given on viral uri  Will watch for wheeze/sob or signs of sinusitis  Enc to quit smoking        Other   Tobacco abuse    Disc in detail risks of smoking and possible outcomes including copd, vascular/ heart disease, cancer , respiratory and sinus infections  Pt voices understanding He is not yet ready to quit but considering chantix in the future

## 2018-05-29 NOTE — Assessment & Plan Note (Addendum)
Reassuring exam / also a smoker  No reactive airways on exam  Disc symptomatic care - see instructions on AVS  Px tessalon for cough tid  prometh -DM for bed time with caution of sedation Update if not starting to improve in a week or if worsening    Handout given on viral uri  Will watch for wheeze/sob or signs of sinusitis  Enc to quit smoking

## 2018-05-29 NOTE — Patient Instructions (Signed)
Drink lots of fluids  Get extra rest   Saline nasal spray for congestion as often as you want  mucinex DM is ok for cough and congestion  For runny nose - zyrtec 10 mg over the counter  Tylenol for body aches or fever (or ibuprofen - with food)   I will send in tessalon for cough  For more severe cough at night (or when not working or driving)- prometh -DM (it can sedating) -- use that or nyquil at night (also don't mix with mucinex DM)   Update if not starting to improve in a week or if worsening   Also if wheezing let us know   Keep thinking about quitting smoking

## 2018-05-29 NOTE — Assessment & Plan Note (Signed)
Disc in detail risks of smoking and possible outcomes including copd, vascular/ heart disease, cancer , respiratory and sinus infections  Pt voices understanding He is not yet ready to quit but considering chantix in the future

## 2018-06-03 ENCOUNTER — Telehealth: Payer: Self-pay | Admitting: Primary Care

## 2018-06-03 MED ORDER — ALBUTEROL SULFATE HFA 108 (90 BASE) MCG/ACT IN AERS: 2.0000 | INHALATION_SPRAY | Freq: Four times a day (QID) | RESPIRATORY_TRACT | 1 refills | Status: DC | PRN

## 2018-06-03 NOTE — Telephone Encounter (Signed)
Copied from [place] [id number]. Topic: Inquiry >> Jun 03, 2018 12:19 PM Margot Ables wrote: Reason for CRM: Pt states that he is feeling a little better than last week but is still having some SOB and coughing. It is worse when laying down. Pt saw Dr. Glori Bickers 05/29/18. Pt states that he had an Ventolin HFA inhaler before but it's out of date by 1 1/2 years. Please advise.

## 2018-06-03 NOTE — Telephone Encounter (Signed)
Please send his ventolin inhaler in for prn use with 1 refill   If he does not continue to improve please f/u

## 2018-06-03 NOTE — Telephone Encounter (Signed)
Please notify patient that I'm more than happy to discuss Chantix as an option for tobacco cessation during an office visit. Please schedule.

## 2018-06-03 NOTE — Telephone Encounter (Signed)
Pt notified Rx sent to pharmacy and advise to f/u if no improvement.  **pt also said he asked if he could start chantix and wanted an Rx for that too. I advise him I would send the Rx request to his PCP and her assistant will call him back to see if she was okay with him starting that. [facility]

## 2018-06-05 NOTE — Telephone Encounter (Signed)
Message left for patient to return my call.  

## 2018-06-07 NOTE — Telephone Encounter (Signed)
Message left for patient to return my call.  

## 2018-06-17 DIAGNOSIS — Z713 Dietary counseling and surveillance: Secondary | ICD-10-CM | POA: Diagnosis not present

## 2018-06-17 DIAGNOSIS — Z23 Encounter for immunization: Secondary | ICD-10-CM | POA: Diagnosis not present

## 2018-06-17 DIAGNOSIS — Z13220 Encounter for screening for lipoid disorders: Secondary | ICD-10-CM | POA: Diagnosis not present

## 2018-06-17 DIAGNOSIS — Z131 Encounter for screening for diabetes mellitus: Secondary | ICD-10-CM | POA: Diagnosis not present

## 2018-06-17 DIAGNOSIS — Z136 Encounter for screening for cardiovascular disorders: Secondary | ICD-10-CM | POA: Diagnosis not present

## 2018-08-12 DIAGNOSIS — L821 Other seborrheic keratosis: Secondary | ICD-10-CM | POA: Diagnosis not present

## 2018-08-12 DIAGNOSIS — L814 Other melanin hyperpigmentation: Secondary | ICD-10-CM | POA: Diagnosis not present

## 2018-08-12 DIAGNOSIS — L988 Other specified disorders of the skin and subcutaneous tissue: Secondary | ICD-10-CM | POA: Diagnosis not present

## 2018-08-12 DIAGNOSIS — L570 Actinic keratosis: Secondary | ICD-10-CM | POA: Diagnosis not present

## 2019-01-15 ENCOUNTER — Other Ambulatory Visit: Payer: Self-pay

## 2019-01-15 ENCOUNTER — Encounter (HOSPITAL_COMMUNITY): Payer: Self-pay | Admitting: Cardiology

## 2019-01-15 ENCOUNTER — Emergency Department (HOSPITAL_COMMUNITY): Payer: Self-pay

## 2019-01-15 ENCOUNTER — Inpatient Hospital Stay (HOSPITAL_COMMUNITY)
Admission: EM | Admit: 2019-01-15 | Discharge: 2019-01-17 | DRG: 247 | Disposition: A | Discharge status: Home / Self Care | Payer: Self-pay | Attending: Cardiovascular Disease | Admitting: Cardiovascular Disease

## 2019-01-15 DIAGNOSIS — I2510 Atherosclerotic heart disease of native coronary artery without angina pectoris: Secondary | ICD-10-CM | POA: Diagnosis present

## 2019-01-15 DIAGNOSIS — Z809 Family history of malignant neoplasm, unspecified: Secondary | ICD-10-CM

## 2019-01-15 DIAGNOSIS — Z823 Family history of stroke: Secondary | ICD-10-CM

## 2019-01-15 DIAGNOSIS — I10 Essential (primary) hypertension: Secondary | ICD-10-CM | POA: Diagnosis present

## 2019-01-15 DIAGNOSIS — Z720 Tobacco use: Secondary | ICD-10-CM | POA: Diagnosis present

## 2019-01-15 DIAGNOSIS — Z23 Encounter for immunization: Secondary | ICD-10-CM

## 2019-01-15 DIAGNOSIS — E785 Hyperlipidemia, unspecified: Secondary | ICD-10-CM | POA: Diagnosis present

## 2019-01-15 DIAGNOSIS — Z79899 Other long term (current) drug therapy: Secondary | ICD-10-CM

## 2019-01-15 DIAGNOSIS — K219 Gastro-esophageal reflux disease without esophagitis: Secondary | ICD-10-CM | POA: Diagnosis present

## 2019-01-15 DIAGNOSIS — I214 Non-ST elevation (NSTEMI) myocardial infarction: Principal | ICD-10-CM

## 2019-01-15 DIAGNOSIS — Z955 Presence of coronary angioplasty implant and graft: Secondary | ICD-10-CM

## 2019-01-15 DIAGNOSIS — F17210 Nicotine dependence, cigarettes, uncomplicated: Secondary | ICD-10-CM | POA: Diagnosis present

## 2019-01-15 HISTORY — DX: Essential (primary) hypertension: I10

## 2019-01-15 HISTORY — DX: Atherosclerotic heart disease of native coronary artery without angina pectoris: I25.10

## 2019-01-15 HISTORY — DX: Hyperlipidemia, unspecified: E78.5

## 2019-01-15 HISTORY — DX: Non-ST elevation (NSTEMI) myocardial infarction: I21.4

## 2019-01-15 LAB — BASIC METABOLIC PANEL
Anion gap: 12 (ref 5–15)
BUN: 7 mg/dL — ABNORMAL LOW (ref 8–23)
CO2: 22 mmol/L (ref 22–32)
Calcium: 9.6 mg/dL (ref 8.9–10.3)
Chloride: 100 mmol/L (ref 98–111)
Creatinine, Ser: 0.84 mg/dL (ref 0.61–1.24)
GFR calc Af Amer: >60 mL/min (ref >60)
GFR calc non Af Amer: >60 mL/min (ref >60)
Glucose, Bld: 125 mg/dL — ABNORMAL HIGH (ref 70–99)
Potassium: 3.9 mmol/L (ref 3.5–5.1)
Sodium: 134 mmol/L — ABNORMAL LOW (ref 135–145)

## 2019-01-15 LAB — TROPONIN I
Troponin I: 0.52 ng/mL (ref <0.03)
Troponin I: 3.3 ng/mL (ref <0.03)

## 2019-01-15 LAB — CBC
HCT: 52.1 % — ABNORMAL HIGH (ref 39.0–52.0)
Hemoglobin: 17.7 g/dL — ABNORMAL HIGH (ref 13.0–17.0)
MCH: 32.4 pg (ref 26.0–34.0)
MCHC: 34 g/dL (ref 30.0–36.0)
MCV: 95.2 fL (ref 80.0–100.0)
Platelets: 234 10*3/uL (ref 150–400)
RBC: 5.47 MIL/uL (ref 4.22–5.81)
RDW: 12.6 % (ref 11.5–15.5)
WBC: 10 10*3/uL (ref 4.0–10.5)
nRBC: 0 % (ref 0.0–0.2)

## 2019-01-15 LAB — HEPATIC FUNCTION PANEL
ALT: 21 U/L (ref 0–44)
AST: 33 U/L (ref 15–41)
Albumin: 4.1 g/dL (ref 3.5–5.0)
Alkaline Phosphatase: 48 U/L (ref 38–126)
Bilirubin, Direct: 0.2 mg/dL (ref 0.0–0.2)
Indirect Bilirubin: 0.6 mg/dL (ref 0.3–0.9)
Total Bilirubin: 0.8 mg/dL (ref 0.3–1.2)
Total Protein: 7.8 g/dL (ref 6.5–8.1)

## 2019-01-15 LAB — LIPASE, BLOOD: Lipase: 36 U/L (ref 11–51)

## 2019-01-15 MED ORDER — HEPARIN (PORCINE) 25000 UT/250ML-% IV SOLN
1450.0000 [IU]/h | INTRAVENOUS | Status: DC
  Administered 2019-01-15: 1250 [IU]/h via INTRAVENOUS
  Administered 2019-01-16: 1450 [IU]/h via INTRAVENOUS
  Filled 2019-01-15 (×2): qty 250

## 2019-01-15 MED ORDER — SODIUM CHLORIDE 0.9 % WEIGHT BASED INFUSION
3.0000 mL/kg/h | INTRAVENOUS | Status: DC
  Administered 2019-01-16: 06:00:00 3 mL/kg/h via INTRAVENOUS

## 2019-01-15 MED ORDER — ATORVASTATIN CALCIUM 80 MG PO TABS
80.0000 mg | ORAL_TABLET | Freq: Every day | ORAL | Status: DC
  Administered 2019-01-16: 80 mg via ORAL
  Filled 2019-01-15: qty 1

## 2019-01-15 MED ORDER — ASPIRIN EC 81 MG PO TBEC
81.0000 mg | DELAYED_RELEASE_TABLET | Freq: Every day | ORAL | Status: DC
  Administered 2019-01-17: 81 mg via ORAL
  Filled 2019-01-15: qty 1

## 2019-01-15 MED ORDER — SODIUM CHLORIDE 0.9% FLUSH: 3.0000 mL | INTRAVENOUS | Status: DC | PRN

## 2019-01-15 MED ORDER — METOPROLOL TARTRATE 25 MG PO TABS
25.0000 mg | ORAL_TABLET | Freq: Two times a day (BID) | ORAL | Status: DC
  Administered 2019-01-15 – 2019-01-17 (×3): 25 mg via ORAL
  Filled 2019-01-15 (×3): qty 1

## 2019-01-15 MED ORDER — HEPARIN BOLUS VIA INFUSION
4000.0000 [IU] | Freq: Once | INTRAVENOUS | Status: AC
  Administered 2019-01-15: 4000 [IU] via INTRAVENOUS
  Filled 2019-01-15: qty 4000

## 2019-01-15 MED ORDER — NITROGLYCERIN 0.4 MG SL SUBL: 0.4000 mg | SUBLINGUAL_TABLET | SUBLINGUAL | Status: DC | PRN

## 2019-01-15 MED ORDER — SODIUM CHLORIDE 0.9 % IV SOLN: 250.0000 mL | INTRAVENOUS | Status: DC | PRN

## 2019-01-15 MED ORDER — ACETAMINOPHEN 325 MG PO TABS
650.0000 mg | ORAL_TABLET | ORAL | Status: DC | PRN
  Administered 2019-01-16: 650 mg via ORAL
  Filled 2019-01-15: qty 2

## 2019-01-15 MED ORDER — PNEUMOCOCCAL VAC POLYVALENT 25 MCG/0.5ML IJ INJ
0.5000 mL | INJECTION | INTRAMUSCULAR | Status: AC
  Administered 2019-01-17: 0.5 mL via INTRAMUSCULAR
  Filled 2019-01-15: qty 0.5

## 2019-01-15 MED ORDER — SODIUM CHLORIDE 0.9% FLUSH
3.0000 mL | Freq: Two times a day (BID) | INTRAVENOUS | Status: DC
  Administered 2019-01-15: 3 mL via INTRAVENOUS

## 2019-01-15 MED ORDER — ASPIRIN 81 MG PO CHEW
81.0000 mg | CHEWABLE_TABLET | ORAL | Status: AC
  Administered 2019-01-16: 81 mg via ORAL

## 2019-01-15 MED ORDER — SODIUM CHLORIDE 0.9 % WEIGHT BASED INFUSION: 1.0000 mL/kg/h | INTRAVENOUS | Status: DC

## 2019-01-15 MED ORDER — ASPIRIN 81 MG PO CHEW
324.0000 mg | CHEWABLE_TABLET | Freq: Once | ORAL | Status: AC
  Administered 2019-01-15: 324 mg via ORAL
  Filled 2019-01-15: qty 4

## 2019-01-15 MED ORDER — NITROGLYCERIN IN D5W 200-5 MCG/ML-% IV SOLN
0.0000 ug/min | INTRAVENOUS | Status: DC
  Administered 2019-01-15: 21:00:00 5 ug/min via INTRAVENOUS
  Filled 2019-01-15: qty 250

## 2019-01-15 NOTE — ED Provider Notes (Signed)
[facility] EMERGENCY DEPARTMENT Provider Note   CSN: [id number] Arrival date & time: 01/15/19  1605    History   Chief Complaint Chief Complaint  Patient presents with  . Chest Pain    HPI Lawrence Wallace is a 63 y.o. male.     HPI Patient ate an egg croissant sandwich at about 9 AM this morning.  He reports that he 20 minutes later started developing pain in his lower chest and epigastric area.  At the same time, he had a intense and constant aching sensation in both forearms.  Pain then also progressed to go across his back.  Reports he felt nauseated but not short of breath.  He reports he vomited once which felt slightly better but did not resolve the symptoms and they continued through the day.  He did not get lightheaded, no syncopal episode, no diaphoresis.  Patient tried Tums with minimal relief.  He denies any prior cardiac evaluation or work-up.  He is a routine smoker.  Reports he sometimes gets a cough off and on but has not recently had cough, fever, chills or body aches.  No symptoms or exposures that would make him think he had COVID-19. Past Medical History:  Diagnosis Date  . Asthma   . Cancer ([place])   . GERD (gastroesophageal reflux disease)   . Tobacco abuse     Patient Active Problem List   Diagnosis Date Noted  . Viral URI with cough 05/29/2018  . Lower extremity pain 03/13/2017  . Chest wall pain 03/01/2016  . GERD (gastroesophageal reflux disease) 05/31/2015  . Preventative health care 05/31/2015  . Tobacco abuse 05/31/2015    Past Surgical History:  Procedure Laterality Date  . FOOT FRACTURE SURGERY          Home Medications    Prior to Admission medications   Medication Sig Start Date End Date Taking? Authorizing Provider  Multiple Vitamin (MULTIVITAMIN) tablet Take 1 tablet by mouth daily.   Yes [provider]  albuterol (PROVENTIL HFA;VENTOLIN HFA) 108 (90 Base) MCG/ACT inhaler Inhale 2 puffs into the lungs every  6 (six) hours as needed for wheezing or shortness of breath. Patient not taking: Reported on 01/15/2019 06/03/18   Tower, Wynelle Fanny, MD  benzonatate (TESSALON) 200 MG capsule Take 1 capsule (200 mg total) by mouth 3 (three) times daily as needed for cough. Do not bite pill Patient not taking: Reported on 01/15/2019 05/29/18   Tower, Wynelle Fanny, MD  promethazine-dextromethorphan (PROMETHAZINE-DM) 6.25-15 MG/5ML syrup Take 5 mLs by mouth at bedtime as needed for cough. Caution of sedation Patient not taking: Reported on 01/15/2019 05/29/18   Abner Greenspan, MD    Family History Family History  Problem Relation Age of Onset  . Stroke Mother   . Cancer Mother   . Cancer Father     Social History Social History   Tobacco Use  . Smoking status: Current Every Day Smoker    Packs/day: 1.50    Years: 45.00    Pack years: 67.50    Types: Cigarettes  . Smokeless tobacco: Never Used  Substance Use Topics  . Alcohol use: Yes    Alcohol/week: 0.0 standard drinks    Comment: occ  . Drug use: Not on file     Allergies   Patient has no known allergies.   Review of Systems Review of Systems 10 Systems reviewed and are negative for acute change except as noted in the HPI.   Physical  Exam Updated Vital Signs BP (!) 172/103   Pulse 80   Resp 19   Ht 6\' 2"  (1.88 m)   Wt 97.5 kg   SpO2 97%   BMI 27.60 kg/m   Physical Exam Constitutional:      Appearance: He is well-developed.  HENT:     Head: Normocephalic and atraumatic.  Eyes:     Pupils: Pupils are equal, round, and reactive to light.  Neck:     Musculoskeletal: Neck supple.  Cardiovascular:     Rate and Rhythm: Normal rate and regular rhythm.     Heart sounds: Normal heart sounds.  Pulmonary:     Effort: Pulmonary effort is normal.     Breath sounds: Normal breath sounds.  Abdominal:     General: Bowel sounds are normal. There is no distension.     Palpations: Abdomen is soft.     Tenderness: There is no abdominal  tenderness.  Musculoskeletal: Normal range of motion.        General: No swelling or tenderness.     Right lower leg: No edema.     Left lower leg: No edema.  Skin:    General: Skin is warm and dry.  Neurological:     Mental Status: He is alert and oriented to person, place, and time.     GCS: GCS eye subscore is 4. GCS verbal subscore is 5. GCS motor subscore is 6.     Coordination: Coordination normal.      ED Treatments / Results  Labs (all labs ordered are listed, but only abnormal results are displayed) Labs Reviewed  BASIC METABOLIC PANEL - Abnormal; Notable for the following components:      Result Value   Sodium 134 (*)    Glucose, Bld 125 (*)    BUN 7 (*)    All other components within normal limits  CBC - Abnormal; Notable for the following components:   Hemoglobin 17.7 (*)    HCT 52.1 (*)    All other components within normal limits  TROPONIN I - Abnormal; Notable for the following components:   Troponin I 0.52 (*)    All other components within normal limits  HEPATIC FUNCTION PANEL  LIPASE, BLOOD  HEPARIN LEVEL (UNFRACTIONATED)  CBC    EKG EKG Interpretation  Date/Time:  Wednesday January 15 2019 16:55:59 EDT Ventricular Rate:  76 PR Interval:    QRS Duration: 86 QT Interval:  391 QTC Calculation: 440 R Axis:   73 Text Interpretation:  Sinus rhythm Minimal ST depression, anterolateral leads lateral depression, appears new Confirmed by Charlesetta Shanks [phone]) on 01/15/2019 5:35:15 PM   Radiology Dg Chest 2 View  Result Date: 01/15/2019 CLINICAL DATA:  Chest pain EXAM: CHEST - 2 VIEW COMPARISON:  None. FINDINGS: COPD with pulmonary hyperinflation. Prominent interstitial lung markings bilaterally could be acute or chronic. Probable scarring however interstitial edema possible. Heart size and vascularity normal. No pleural effusion. No lobar infiltrate. Chronic left rib fractures. IMPRESSION: COPD. Prominent interstitial markings likely due to chronic lung  disease however no prior studies for comparison. Electronically Signed   By: Franchot Gallo M.D.   On: 01/15/2019 16:53    Procedures Procedures (including critical care time) CRITICAL CARE Performed by: Charlesetta Shanks   Total critical care time: 30 minutes  Critical care time was exclusive of separately billable procedures and treating other patients.  Critical care was necessary to treat or prevent imminent or life-threatening deterioration.  Critical care was time spent personally  by me on the following activities: development of treatment plan with patient and/or surrogate as well as nursing, discussions with consultants, evaluation of patient's response to treatment, examination of patient, obtaining history from patient or surrogate, ordering and performing treatments and interventions, ordering and review of laboratory studies, ordering and review of radiographic studies, pulse oximetry and re-evaluation of patient's condition. Medications Ordered in ED Medications  nitroGLYCERIN (NITROSTAT) SL tablet 0.4 mg (has no administration in time range)  heparin ADULT infusion 100 units/mL (25000 units/232mL sodium chloride 0.45%) (1,250 Units/hr Intravenous New Bag/Given 01/15/19 1811)  aspirin chewable tablet 324 mg (324 mg Oral Given 01/15/19 1804)  heparin bolus via infusion 4,000 Units (4,000 Units Intravenous Bolus from Bag 01/15/19 1812)     Initial Impression / Assessment and Plan / ED Course  I have reviewed the triage vital signs and the nursing notes.  Pertinent labs & imaging results that were available during my care of the patient were reviewed by me and considered in my medical decision making (see chart for details).        Consult: Case reviewed with Dr. Burt Knack 17: 6.  He will evaluate and admit the patient.  Patient presents as outlined above.  He is alert and appropriate.  No respiratory distress at rest.  Heart rate is sinus and regular.  Blood pressure mildly  hypertensive but not severe hypertension.  Troponin elevated and EKG with lateral ST depression.  Findings consistent with an STEMI.  Aspirin, heparin and nitrate initiated.  plan for admission.  Final Clinical Impressions(s) / ED Diagnoses   Final diagnoses:  NSTEMI (non-ST elevated myocardial infarction) [facility])    ED Discharge Orders    None       Charlesetta Shanks, MD 01/15/19 [facility] Emerald

## 2019-01-15 NOTE — ED Notes (Signed)
ED TO INPATIENT HANDOFF REPORT  ED Nurse Name and Phone #: [phone]  S Name/Age/Gender Lawrence Wallace 63 y.o. male Room/Bed: 032C/032C  Code Status   Code Status: Not on file  Home/SNF/Other Home Patient oriented to: self, place, time and situation Is this baseline? Yes   Triage Complete: Triage complete  Chief Complaint CP; Arm Pain  Triage Note Pt here for evaluation of central chest pain with radiation to both arms onset 30 minutes after he ate this afternoon. Pt took two pepto-bismol tablets with some temporary relief. One episode vomiting.    Allergies No Known Allergies  Level of Care/Admitting Diagnosis ED Disposition    ED Disposition Condition Comment   Admit  Hospital Area: [facility] [[id number]]  Level of Care: Telemetry Cardiac [103]  Covid Evaluation: N/A  Diagnosis: NSTEMI (non-ST elevated myocardial infarction) [facility]) [[id number]]  Admitting Physician: [place], [place]  Attending Physician: Sherren Mocha [[id number]]  Estimated length of stay: past midnight tomorrow  Certification:: I certify this patient will need inpatient services for at least 2 midnights  PT Class (Do Not Modify): Inpatient [101]  PT Acc Code (Do Not Modify): Private [1]       B Medical/Surgery History Past Medical History:  Diagnosis Date  . Asthma   . Cancer ([place])   . GERD (gastroesophageal reflux disease)   . NSTEMI (non-ST elevated myocardial infarction) ([place]) 01/15/2019  . Tobacco abuse    Past Surgical History:  Procedure Laterality Date  . FOOT FRACTURE SURGERY       A IV Location/Drains/Wounds Patient Lines/Drains/Airways Status   Active Line/Drains/Airways    Name:   Placement date:   Placement time:   Site:   Days:   Peripheral IV 01/15/19 Right Antecubital   01/15/19    1613    Antecubital   less than 1   Peripheral IV 01/15/19 Right Forearm   01/15/19    1808    Forearm   less than 1          Intake/Output Last 24 hours No intake or output  data in the 24 hours ending 01/15/19 1857  Labs/Imaging Results for orders placed or performed during the hospital encounter of 01/15/19 (from the past 48 hour(s))  Basic metabolic panel     Status: Abnormal   Collection Time: 01/15/19  4:15 PM  Result Value Ref Range   Sodium 134 (L) 135 - 145 mmol/L   Potassium 3.9 3.5 - 5.1 mmol/L   Chloride 100 98 - 111 mmol/L   CO2 22 22 - 32 mmol/L   Glucose, Bld 125 (H) 70 - 99 mg/dL   BUN 7 (L) 8 - 23 mg/dL   Creatinine, Ser 0.84 0.61 - 1.24 mg/dL   Calcium 9.6 8.9 - 10.3 mg/dL   GFR calc non Af Amer >60 >60 mL/min   GFR calc Af Amer >60 >60 mL/min   Anion gap 12 5 - 15    Comment: Performed at [facility], [place] [address], [place], [place] [address]  CBC     Status: Abnormal   Collection Time: 01/15/19  4:15 PM  Result Value Ref Range   WBC 10.0 4.0 - 10.5 K/uL   RBC 5.47 4.22 - 5.81 MIL/uL   Hemoglobin 17.7 (H) 13.0 - 17.0 g/dL   HCT 52.1 (H) 39.0 - 52.0 %   MCV 95.2 80.0 - 100.0 fL   MCH 32.4 26.0 - 34.0 pg   MCHC 34.0 30.0 - 36.0 g/dL  RDW 12.6 11.5 - 15.5 %   Platelets 234 150 - 400 K/uL   nRBC 0.0 0.0 - 0.2 %    Comment: Performed at [facility], [place] [address], [place], [place] [address]  Troponin I - ONCE - STAT     Status: Abnormal   Collection Time: 01/15/19  4:15 PM  Result Value Ref Range   Troponin I 0.52 (HH) <0.03 ng/mL    Comment: CRITICAL RESULT CALLED TO, READ BACK BY AND VERIFIED WITH: K COBB,RN 1717 01/15/2019 WBOND Performed at [facility], [place] [address], [place], [place] [address]   Hepatic function panel     Status: None   Collection Time: 01/15/19  5:48 PM  Result Value Ref Range   Total Protein 7.8 6.5 - 8.1 g/dL   Albumin 4.1 3.5 - 5.0 g/dL   AST 33 15 - 41 U/L   ALT 21 0 - 44 U/L   Alkaline Phosphatase 48 38 - 126 U/L   Total Bilirubin 0.8 0.3 - 1.2 mg/dL   Bilirubin, Direct 0.2 0.0 - 0.2 mg/dL   Indirect Bilirubin 0.6 0.3 - 0.9 mg/dL    Comment: Performed at [facility] [address], [place], [place] [address]  Lipase, blood     Status: None   Collection Time: 01/15/19  5:48 PM  Result Value Ref Range   Lipase 36 11 - 51 U/L    Comment: Performed at [facility] [address] [place], [place] [address]   Dg Chest 2 View  Result Date: 01/15/2019 CLINICAL DATA:  Chest pain EXAM: CHEST - 2 VIEW COMPARISON:  None. FINDINGS: COPD with pulmonary hyperinflation. Prominent interstitial lung markings bilaterally could be acute or chronic. Probable scarring however interstitial edema possible. Heart size and vascularity normal. No pleural effusion. No lobar infiltrate. Chronic left rib fractures. IMPRESSION: COPD. Prominent interstitial markings likely due to chronic lung disease however no prior studies for comparison. Electronically Signed   By: Franchot Gallo M.D.   On: 01/15/2019 16:53    Pending Labs Unresulted Labs (From admission, onward)    Start     Ordered   01/17/19 0500  Heparin level (unfractionated)  Daily,   R     01/15/19 1758   01/16/19 0500  CBC  Daily,   R     01/15/19 1758   01/16/19 0030  Heparin level (unfractionated)  Once-Timed,   R     01/15/19 1758   Signed and Held  HIV antibody (Routine Testing)  Once,   R     Signed and Held   Signed and Held  Troponin I - Now Then Q6H  Now then every 6 hours,   STAT     Signed and Held   Signed and Held  Basic metabolic panel  Tomorrow morning,   R     Signed and Held   Signed and Held  Lipid panel  Tomorrow morning,   R     Signed and Held   Signed and Held  CBC  Tomorrow morning,   R     Signed and Held          Vitals/Pain Today's Vitals   01/15/19 1704 01/15/19 1813 01/15/19 1815 01/15/19 1857  BP:  (!) 166/102 (!) 172/103   Pulse:  76 80   Resp:  16 19   SpO2:  97% 97%   Weight:      Height:  PainSc: 3    3     Isolation Precautions No active isolations  Medications Medications  nitroGLYCERIN (NITROSTAT) SL tablet 0.4 mg (has no administration in  time range)  heparin ADULT infusion 100 units/mL (25000 units/215mL sodium chloride 0.45%) (1,250 Units/hr Intravenous New Bag/Given 01/15/19 1811)  aspirin chewable tablet 324 mg (324 mg Oral Given 01/15/19 1804)  heparin bolus via infusion 4,000 Units (4,000 Units Intravenous Bolus from Bag 01/15/19 1812)    Mobility walks Low fall risk   Focused Assessments Cardiac Assessment Handoff:  Cardiac Rhythm: Normal sinus rhythm Lab Results  Component Value Date   TROPONINI 0.52 ([place]) 01/15/2019   No results found for: DDIMER Does the Patient currently have chest pain? Yes     R Recommendations: See Admitting Provider Note  Report given to:   Additional Notes:

## 2019-01-15 NOTE — Progress Notes (Signed)
ANTICOAGULATION CONSULT NOTE - Initial Consult  Pharmacy Consult for heparin Indication: chest pain/ACS  No Known Allergies  Patient Measurements: Height: 6\' 2"  (188 cm) Weight: 215 lb (97.5 kg) IBW/kg (Calculated) : 82.2 Heparin Dosing Weight: 97.5kg  Vital Signs: BP: 166/107 (04/29 1700) Pulse Rate: 80 (04/29 1700)  Labs: Recent Labs    01/15/19 1615  HGB 17.7*  HCT 52.1*  PLT 234  CREATININE 0.84  TROPONINI 0.52*    Estimated Creatinine Clearance: 103.3 mL/min (by C-G formula based on SCr of 0.84 mg/dL).   Medical History: Past Medical History:  Diagnosis Date  . Asthma   . Cancer ([place])   . GERD (gastroesophageal reflux disease)   . Tobacco abuse     Medications:  Infusions:  . heparin      Assessment: 5 yom presented to the ED with CP. Troponin elevated and now starting IV heparin. Baseline CBC is ok and he is not on anticoagulation PTA.   Goal of Therapy:  Heparin level 0.3-0.7 units/ml Monitor platelets by anticoagulation protocol: Yes   Plan:  Heparin bolus 4000 units IV x 1 Heparin gtt 1250 units/hr Check a 6 hr heparin level Daily heparin level and CBC  Achilles Neville, Rande Lawman 01/15/2019,5:58 PM

## 2019-01-15 NOTE — ED Notes (Signed)
Report given to rn on 6e 

## 2019-01-15 NOTE — ED Triage Notes (Signed)
Pt here for evaluation of central chest pain with radiation to both arms onset 30 minutes after he ate this afternoon. Pt took two pepto-bismol tablets with some temporary relief. One episode vomiting.

## 2019-01-15 NOTE — ED Notes (Signed)
No pain in chest pain in back from this uncomfortable stretcher

## 2019-01-15 NOTE — Progress Notes (Signed)
CRITICAL VALUE ALERT  Critical Value:  Troponin 3.30  Date & Time Notied:  01/15/19 2238  Provider Notified: Dr. Charissa Bash  Orders Received/Actions taken: No new orders

## 2019-01-15 NOTE — ED Notes (Signed)
Unsuccessful attempt to give report  rn will call me back

## 2019-01-15 NOTE — H&P (Signed)
Cardiology Admission History and Physical:   Patient ID: Lawrence Wallace MRN: [id number]; DOB: 12-03-1955   Admission date: 01/15/2019  Primary Care Provider: Pleas Koch, NP Primary Cardiologist: No primary care provider on file.  Primary Electrophysiologist:  None   Chief Complaint:  Chest pain  Patient Profile:   Lawrence Wallace is a 63 y.o. male with no past cardiac history presenting with substernal chest pain  History of Present Illness:   Lawrence Wallace is a 63 year old gentleman with no chronic medical problems who presents with chest pain that started this morning.  He woke up feeling normal, but after breakfast developed substernal chest discomfort radiating to both arms.  He has not had symptoms like this in the past.  Describes the pain as a pressure-like sensation.  There is no associated nausea, vomiting, or diaphoresis.  There is no shortness of breath.  The patient's symptoms have continued, but he now rates the chest pain at 4/10 after administration of a sublingual nitroglycerin.  The patient smokes 1-1/2 packs of cigarettes per day.  He is a longstanding smoker.  He has not been treated for hypertension, hyper lipidemia, or diabetes.  He has no family history of premature CAD.   Past Medical History:  Diagnosis Date  . Asthma   . Cancer ([place])   . GERD (gastroesophageal reflux disease)   . Tobacco abuse     Past Surgical History:  Procedure Laterality Date  . FOOT FRACTURE SURGERY       Medications Prior to Admission: Prior to Admission medications   Medication Sig Start Date End Date Taking? Authorizing Provider  Multiple Vitamin (MULTIVITAMIN) tablet Take 1 tablet by mouth daily.   Yes [provider]  albuterol (PROVENTIL HFA;VENTOLIN HFA) 108 (90 Base) MCG/ACT inhaler Inhale 2 puffs into the lungs every 6 (six) hours as needed for wheezing or shortness of breath. Patient not taking: Reported on 01/15/2019 06/03/18   Tower, Wynelle Fanny, MD  benzonatate  (TESSALON) 200 MG capsule Take 1 capsule (200 mg total) by mouth 3 (three) times daily as needed for cough. Do not bite pill Patient not taking: Reported on 01/15/2019 05/29/18   Tower, Wynelle Fanny, MD  promethazine-dextromethorphan (PROMETHAZINE-DM) 6.25-15 MG/5ML syrup Take 5 mLs by mouth at bedtime as needed for cough. Caution of sedation Patient not taking: Reported on 01/15/2019 05/29/18   Abner Greenspan, MD     Allergies:   No Known Allergies  Social History:   Social History   Socioeconomic History  . Marital status: Married    Spouse name: Not on file  . Number of children: Not on file  . Years of education: Not on file  . Highest education level: Not on file  Occupational History  . Not on file  Social Needs  . Financial resource strain: Not on file  . Food insecurity:    Worry: Not on file    Inability: Not on file  . Transportation needs:    Medical: Not on file    Non-medical: Not on file  Tobacco Use  . Smoking status: Current Every Day Smoker    Packs/day: 1.50    Years: 45.00    Pack years: 67.50    Types: Cigarettes  . Smokeless tobacco: Never Used  Substance and Sexual Activity  . Alcohol use: Yes    Alcohol/week: 0.0 standard drinks    Comment: occ  . Drug use: Not on file  . Sexual activity: Not on file  Lifestyle  .  Physical activity:    Days per week: Not on file    Minutes per session: Not on file  . Stress: Not on file  Relationships  . Social connections:    Talks on phone: Not on file    Gets together: Not on file    Attends religious service: Not on file    Active member of club or organization: Not on file    Attends meetings of clubs or organizations: Not on file    Relationship status: Not on file  . Intimate partner violence:    Fear of current or ex partner: Not on file    Emotionally abused: Not on file    Physically abused: Not on file    Forced sexual activity: Not on file  Other Topics Concern  . Not on file  Social History  Narrative  . Not on file    Family History:   The patient's family history includes Cancer in his father and mother; Stroke in his mother.    ROS:  Please see the history of present illness.  All other ROS reviewed and negative.     Physical Exam/Data:   Vitals:   01/15/19 1656 01/15/19 1700 01/15/19 1813 01/15/19 1815  BP: (!) 176/100 (!) 166/107 (!) 166/102 (!) 172/103  Pulse: 79 80 76 80  Resp: (!) 24 17 16 19   SpO2: 97% 98% 97% 97%  Weight:      Height:       No intake or output data in the 24 hours ending 01/15/19 1833 Last 3 Weights 01/15/2019 05/29/2018 12/10/2017  Weight (lbs) 215 lb 220 lb 220 lb  Weight (kg) 97.523 kg 99.791 kg 99.791 kg     Body mass index is 27.6 kg/m.  General:  Well nourished, well developed, in no acute distress HEENT: normal Lymph: no adenopathy Neck: no JVD Endocrine:  No thryomegaly Vascular: No carotid bruits; FA pulses 2+ bilaterally, radial pulses are 2+ and equal bilaterally Cardiac:  normal S1, S2; RRR; no murmur  Lungs: Scattered rhonchi, moderately diminished breath sounds bilaterally Abd: soft, nontender, no hepatomegaly  Ext: no edema Musculoskeletal:  No deformities, BUE and BLE strength normal and equal Skin: warm and dry  Neuro:  CNs 2-12 intact, no focal abnormalities noted Psych:  Normal affect    EKG:  The ECG that was done today was personally reviewed and demonstrates normal sinus rhythm, ST/T wave abnormality consider anterolateral ischemia  Relevant CV Studies: Pending  Laboratory Data:  Chemistry Recent Labs  Lab 01/15/19 1615  NA 134*  K 3.9  CL 100  CO2 22  GLUCOSE 125*  BUN 7*  CREATININE 0.84  CALCIUM 9.6  GFRNONAA >60  GFRAA >60  ANIONGAP 12    Recent Labs  Lab 01/15/19 1748  PROT 7.8  ALBUMIN 4.1  AST 33  ALT 21  ALKPHOS 48  BILITOT 0.8   Hematology Recent Labs  Lab 01/15/19 1615  WBC 10.0  RBC 5.47  HGB 17.7*  HCT 52.1*  MCV 95.2  MCH 32.4  MCHC 34.0  RDW 12.6  PLT 234    Cardiac Enzymes Recent Labs  Lab 01/15/19 1615  TROPONINI 0.52*   No results for input(s): TROPIPOC in the last 168 hours.  BNPNo results for input(s): BNP, PROBNP in the last 168 hours.  DDimer No results for input(s): DDIMER in the last 168 hours.  Radiology/Studies:  Dg Chest 2 View  Result Date: 01/15/2019 CLINICAL DATA:  Chest pain EXAM: CHEST - 2  VIEW COMPARISON:  None. FINDINGS: COPD with pulmonary hyperinflation. Prominent interstitial lung markings bilaterally could be acute or chronic. Probable scarring however interstitial edema possible. Heart size and vascularity normal. No pleural effusion. No lobar infiltrate. Chronic left rib fractures. IMPRESSION: COPD. Prominent interstitial markings likely due to chronic lung disease however no prior studies for comparison. Electronically Signed   By: Franchot Gallo M.D.   On: 01/15/2019 16:53    Assessment and Plan:   1. Non-ST elevation MI: The patient continues to have chest discomfort but appears very comfortable at the time of my evaluation.  Will start him on metoprolol 25 mg twice daily.  We will add IV nitroglycerin until chest pain free.  Continue IV heparin.  Cardiac catheterization and possible PCI are clearly indicated to further evaluate his coronary anatomy in the setting of ACS/non-STEMI. I have reviewed the risks, indications, and alternatives to cardiac catheterization, possible angioplasty, and stenting with the patient. Risks include but are not limited to bleeding, infection, vascular injury, stroke, myocardial infection, arrhythmia, kidney injury, radiation-related injury in the case of prolonged fluoroscopy use, emergency cardiac surgery, and death. The patient understands the risks of serious complication is 1-2 in 4098 with diagnostic cardiac cath and 1-2% or less with angioplasty/stenting.  2. Hypertension, uncontrolled: Suspect this patient is hypertensive at baseline and just has not had regular medical care.  Will  initiate a beta-blocker and IV nitroglycerin.  Will continue to adjust antihypertensive medications until blood pressure is controlled. 3. Lipids: We will start high intensity statin drug in the setting of non-STEMI.  Check lipid panel.  Disposition: Anticipate cardiac catheterization and possible PCI tomorrow, unless symptoms progress tonight with escalation in chest pain requiring urgent cath.  For questions or updates, please contact [place] Please consult [email or web address] for contact info under   Signed, Sherren Mocha, MD  01/15/2019 6:33 PM

## 2019-01-16 ENCOUNTER — Inpatient Hospital Stay (HOSPITAL_COMMUNITY)
Admission: EM | Disposition: A | Discharge status: Home / Self Care | Payer: Self-pay | Source: Home / Self Care | Attending: Cardiovascular Disease

## 2019-01-16 ENCOUNTER — Inpatient Hospital Stay (HOSPITAL_COMMUNITY): Payer: Self-pay

## 2019-01-16 DIAGNOSIS — I2510 Atherosclerotic heart disease of native coronary artery without angina pectoris: Secondary | ICD-10-CM

## 2019-01-16 DIAGNOSIS — E785 Hyperlipidemia, unspecified: Secondary | ICD-10-CM

## 2019-01-16 DIAGNOSIS — I214 Non-ST elevation (NSTEMI) myocardial infarction: Secondary | ICD-10-CM

## 2019-01-16 DIAGNOSIS — I10 Essential (primary) hypertension: Secondary | ICD-10-CM

## 2019-01-16 HISTORY — PX: CORONARY STENT INTERVENTION: CATH118234

## 2019-01-16 HISTORY — PX: LEFT HEART CATH AND CORONARY ANGIOGRAPHY: CATH118249

## 2019-01-16 LAB — BASIC METABOLIC PANEL
Anion gap: 11 (ref 5–15)
BUN: 8 mg/dL (ref 8–23)
CO2: 21 mmol/L — ABNORMAL LOW (ref 22–32)
Calcium: 8.9 mg/dL (ref 8.9–10.3)
Chloride: 103 mmol/L (ref 98–111)
Creatinine, Ser: 0.76 mg/dL (ref 0.61–1.24)
GFR calc Af Amer: >60 mL/min (ref >60)
GFR calc non Af Amer: >60 mL/min (ref >60)
Glucose, Bld: 112 mg/dL — ABNORMAL HIGH (ref 70–99)
Potassium: 4.1 mmol/L (ref 3.5–5.1)
Sodium: 135 mmol/L (ref 135–145)

## 2019-01-16 LAB — CBC
HCT: 46.2 % (ref 39.0–52.0)
HCT: 46.7 % (ref 39.0–52.0)
Hemoglobin: 15.9 g/dL (ref 13.0–17.0)
Hemoglobin: 15.9 g/dL (ref 13.0–17.0)
MCH: 32.1 pg (ref 26.0–34.0)
MCH: 31.6 pg (ref 26.0–34.0)
MCHC: 34.4 g/dL (ref 30.0–36.0)
MCHC: 34 g/dL (ref 30.0–36.0)
MCV: 93.1 fL (ref 80.0–100.0)
MCV: 92.8 fL (ref 80.0–100.0)
Platelets: 211 10*3/uL (ref 150–400)
Platelets: 220 10*3/uL (ref 150–400)
RBC: 4.96 MIL/uL (ref 4.22–5.81)
RBC: 5.03 MIL/uL (ref 4.22–5.81)
RDW: 12.4 % (ref 11.5–15.5)
RDW: 12.4 % (ref 11.5–15.5)
WBC: 11 10*3/uL — ABNORMAL HIGH (ref 4.0–10.5)
WBC: 11.2 10*3/uL — ABNORMAL HIGH (ref 4.0–10.5)
nRBC: 0 % (ref 0.0–0.2)
nRBC: 0 % (ref 0.0–0.2)

## 2019-01-16 LAB — LIPID PANEL
Cholesterol: 197 mg/dL (ref 0–200)
HDL: 38 mg/dL — ABNORMAL LOW (ref >40)
LDL Cholesterol: 109 mg/dL — ABNORMAL HIGH (ref 0–99)
Total CHOL/HDL Ratio: 5.2 RATIO
Triglycerides: 248 mg/dL — ABNORMAL HIGH (ref <150)
VLDL: 50 mg/dL — ABNORMAL HIGH (ref 0–40)

## 2019-01-16 LAB — HIV ANTIBODY (ROUTINE TESTING W REFLEX): HIV Screen 4th Generation wRfx: NONREACTIVE

## 2019-01-16 LAB — ECHOCARDIOGRAM LIMITED
Height: 74 in
Weight: 3470.4 oz

## 2019-01-16 LAB — POCT ACTIVATED CLOTTING TIME
Activated Clotting Time: 213 seconds
Activated Clotting Time: 180 seconds

## 2019-01-16 LAB — HEPARIN LEVEL (UNFRACTIONATED)
Heparin Unfractionated: 0.15 IU/mL — ABNORMAL LOW (ref 0.30–0.70)
Heparin Unfractionated: 0.28 IU/mL — ABNORMAL LOW (ref 0.30–0.70)

## 2019-01-16 LAB — TROPONIN I
Troponin I: 14.94 ng/mL (ref <0.03)
Troponin I: 18.14 ng/mL (ref <0.03)

## 2019-01-16 SURGERY — LEFT HEART CATH AND CORONARY ANGIOGRAPHY
Anesthesia: LOCAL

## 2019-01-16 MED ORDER — HYDRALAZINE HCL 20 MG/ML IJ SOLN: 10.0000 mg | INTRAMUSCULAR | Status: AC | PRN

## 2019-01-16 MED ORDER — FENTANYL CITRATE (PF) 100 MCG/2ML IJ SOLN
INTRAMUSCULAR | Status: DC | PRN
  Administered 2019-01-16: 50 ug via INTRAVENOUS
  Administered 2019-01-16 (×2): 25 ug via INTRAVENOUS

## 2019-01-16 MED ORDER — TICAGRELOR 90 MG PO TABS
ORAL_TABLET | ORAL | Status: DC | PRN
  Administered 2019-01-16: 180 mg via ORAL

## 2019-01-16 MED ORDER — MIDAZOLAM HCL 2 MG/2ML IJ SOLN
INTRAMUSCULAR | Status: AC
  Filled 2019-01-16: qty 2

## 2019-01-16 MED ORDER — FENTANYL CITRATE (PF) 100 MCG/2ML IJ SOLN
INTRAMUSCULAR | Status: AC
  Filled 2019-01-16: qty 2

## 2019-01-16 MED ORDER — NITROGLYCERIN 1 MG/10 ML FOR IR/CATH LAB
INTRA_ARTERIAL | Status: AC
  Filled 2019-01-16: qty 10

## 2019-01-16 MED ORDER — ASPIRIN 81 MG PO CHEW
CHEWABLE_TABLET | ORAL | Status: AC
  Filled 2019-01-16: qty 1

## 2019-01-16 MED ORDER — TIROFIBAN HCL IN NACL 5-0.9 MG/100ML-% IV SOLN
0.1500 ug/kg/min | INTRAVENOUS | Status: AC
  Filled 2019-01-16: qty 100

## 2019-01-16 MED ORDER — SODIUM CHLORIDE 0.9% FLUSH
3.0000 mL | Freq: Two times a day (BID) | INTRAVENOUS | Status: DC
  Administered 2019-01-17: 3 mL via INTRAVENOUS

## 2019-01-16 MED ORDER — LIDOCAINE HCL (PF) 1 % IJ SOLN
INTRAMUSCULAR | Status: AC
  Filled 2019-01-16: qty 30

## 2019-01-16 MED ORDER — SODIUM CHLORIDE 0.9% FLUSH: 3.0000 mL | INTRAVENOUS | Status: DC | PRN

## 2019-01-16 MED ORDER — IOHEXOL 350 MG/ML SOLN
INTRAVENOUS | Status: DC | PRN
  Administered 2019-01-16: 282 mL via INTRAVENOUS

## 2019-01-16 MED ORDER — HEPARIN (PORCINE) IN NACL 1000-0.9 UT/500ML-% IV SOLN
INTRAVENOUS | Status: DC | PRN
  Administered 2019-01-16 (×3): 500 mL

## 2019-01-16 MED ORDER — VERAPAMIL HCL 2.5 MG/ML IV SOLN
INTRAVENOUS | Status: AC
  Filled 2019-01-16: qty 2

## 2019-01-16 MED ORDER — TICAGRELOR 90 MG PO TABS
90.0000 mg | ORAL_TABLET | Freq: Two times a day (BID) | ORAL | Status: DC
  Administered 2019-01-16 – 2019-01-17 (×2): 90 mg via ORAL
  Filled 2019-01-16 (×2): qty 1

## 2019-01-16 MED ORDER — LABETALOL HCL 5 MG/ML IV SOLN: 10.0000 mg | INTRAVENOUS | Status: AC | PRN

## 2019-01-16 MED ORDER — HEPARIN SODIUM (PORCINE) 1000 UNIT/ML IJ SOLN
INTRAMUSCULAR | Status: AC
  Filled 2019-01-16: qty 1

## 2019-01-16 MED ORDER — MORPHINE SULFATE (PF) 2 MG/ML IV SOLN
2.0000 mg | Freq: Once | INTRAVENOUS | Status: AC
  Administered 2019-01-16: 2 mg via INTRAVENOUS
  Filled 2019-01-16: qty 1

## 2019-01-16 MED ORDER — HEPARIN SODIUM (PORCINE) 1000 UNIT/ML IJ SOLN
INTRAMUSCULAR | Status: DC | PRN
  Administered 2019-01-16 (×2): 3000 [IU] via INTRAVENOUS
  Administered 2019-01-16: 10000 [IU] via INTRAVENOUS

## 2019-01-16 MED ORDER — SODIUM CHLORIDE 0.9 % IV SOLN: 250.0000 mL | INTRAVENOUS | Status: DC | PRN

## 2019-01-16 MED ORDER — NITROGLYCERIN 1 MG/10 ML FOR IR/CATH LAB
INTRA_ARTERIAL | Status: DC | PRN
  Administered 2019-01-16: 200 ug via INTRACORONARY

## 2019-01-16 MED ORDER — HEPARIN (PORCINE) IN NACL 1000-0.9 UT/500ML-% IV SOLN
INTRAVENOUS | Status: AC
  Filled 2019-01-16: qty 500

## 2019-01-16 MED ORDER — ALBUTEROL SULFATE (2.5 MG/3ML) 0.083% IN NEBU
2.5000 mg | INHALATION_SOLUTION | RESPIRATORY_TRACT | Status: DC | PRN
  Administered 2019-01-16: 2.5 mg via RESPIRATORY_TRACT
  Filled 2019-01-16: qty 3

## 2019-01-16 MED ORDER — HEPARIN BOLUS VIA INFUSION
2000.0000 [IU] | Freq: Once | INTRAVENOUS | Status: AC
  Administered 2019-01-16: 2000 [IU] via INTRAVENOUS
  Filled 2019-01-16: qty 2000

## 2019-01-16 MED ORDER — MIDAZOLAM HCL 2 MG/2ML IJ SOLN
INTRAMUSCULAR | Status: DC | PRN
  Administered 2019-01-16 (×2): 1 mg via INTRAVENOUS
  Administered 2019-01-16: 2 mg via INTRAVENOUS

## 2019-01-16 MED ORDER — TIROFIBAN HCL IN NACL 5-0.9 MG/100ML-% IV SOLN
INTRAVENOUS | Status: AC
  Filled 2019-01-16: qty 100

## 2019-01-16 MED ORDER — TIROFIBAN (AGGRASTAT) BOLUS VIA INFUSION
INTRAVENOUS | Status: DC | PRN
  Administered 2019-01-16: 2460 ug via INTRAVENOUS

## 2019-01-16 MED ORDER — HEPARIN (PORCINE) IN NACL 1000-0.9 UT/500ML-% IV SOLN
INTRAVENOUS | Status: AC
  Filled 2019-01-16: qty 1000

## 2019-01-16 MED ORDER — LIDOCAINE HCL (PF) 1 % IJ SOLN
INTRAMUSCULAR | Status: DC | PRN
  Administered 2019-01-16: 15 mL via INTRADERMAL
  Administered 2019-01-16: 5 mL via INTRADERMAL

## 2019-01-16 MED ORDER — SODIUM CHLORIDE 0.9 % IV SOLN: INTRAVENOUS | Status: AC

## 2019-01-16 MED ORDER — TIROFIBAN HCL IN NACL 5-0.9 MG/100ML-% IV SOLN
INTRAVENOUS | Status: AC | PRN
  Administered 2019-01-16: 0.15 ug/kg/min via INTRAVENOUS

## 2019-01-16 MED ORDER — TICAGRELOR 90 MG PO TABS
ORAL_TABLET | ORAL | Status: AC
  Filled 2019-01-16: qty 2

## 2019-01-16 SURGICAL SUPPLY — 31 items
BALLN SAPPHIRE 2.0X12 (BALLOONS) ×2
BALLN ~~LOC~~ EMERGE MR 2.75X6 (BALLOONS) ×2
BALLOON SAPPHIRE 2.0X12 (BALLOONS) ×1 IMPLANT
BALLOON ~~LOC~~ EMERGE MR 2.75X6 (BALLOONS) ×1 IMPLANT
CATH INFINITI 5FR MULTPACK ANG (CATHETERS) ×2 IMPLANT
CATH LAUNCHER 6FR AL1 (CATHETERS) ×1 IMPLANT
CATH LAUNCHER 6FR JL4 (CATHETERS) ×2 IMPLANT
CATH SUPERCROSS ANGLED 90 DEG (MICROCATHETER) ×2 IMPLANT
CATH VENTURE RX (CATHETERS) ×2 IMPLANT
CATH VISTA GUIDE 6FR XBLAD3.5 (CATHETERS) ×2 IMPLANT
CATHETER LAUNCHER 6FR AL1 (CATHETERS) ×2
GLIDESHEATH SLEND SS 6F .021 (SHEATH) ×2 IMPLANT
GUIDEWIRE INQWIRE 1.5J.035X260 (WIRE) ×1 IMPLANT
INQWIRE 1.5J .035X260CM (WIRE) ×2
KIT ENCORE 26 ADVANTAGE (KITS) ×2 IMPLANT
KIT HEART LEFT (KITS) ×2 IMPLANT
KIT MICROPUNCTURE NIT STIFF (SHEATH) ×2 IMPLANT
PACK CARDIAC CATHETERIZATION (CUSTOM PROCEDURE TRAY) ×2 IMPLANT
SHEATH BRITE TIP 6FR 35CM (SHEATH) ×2 IMPLANT
SHEATH PINNACLE 5F 10CM (SHEATH) ×2 IMPLANT
SHEATH PROBE COVER 6X72 (BAG) ×2 IMPLANT
STENT SYNERGY DES 2.5X16 (Permanent Stent) ×2 IMPLANT
SYR MEDRAD MARK 7 150ML (SYRINGE) ×2 IMPLANT
TRANSDUCER W/STOPCOCK (MISCELLANEOUS) ×2 IMPLANT
TUBING CIL FLEX 10 FLL-RA (TUBING) ×2 IMPLANT
WIRE COUGAR XT STRL 190CM (WIRE) ×4 IMPLANT
WIRE EMERALD 3MM-J .035X150CM (WIRE) ×2 IMPLANT
WIRE HI TORQ VERSACORE-J 145CM (WIRE) ×2 IMPLANT
WIRE HI TORQ WHISPER MS 190CM (WIRE) ×2 IMPLANT
WIRE HI TORQ WHISPER MS 300CM (WIRE) ×2 IMPLANT
WIRE RUNTHROUGH .014X300CM (WIRE) ×2 IMPLANT

## 2019-01-16 NOTE — Progress Notes (Signed)
  Echocardiogram 2D Echocardiogram has been performed.  Lawrence Wallace 01/16/2019, 4:57 PM

## 2019-01-16 NOTE — Progress Notes (Addendum)
Site area: Right groin a 6 french long arterial sheath was removed  Site Prior to Removal:  Level 0  Pressure Applied For 20 MINUTES    Bedrest Beginning at 1510pm  Manual:   Yes.    Patient Status During Pull:  stable  Post Pull Groin Site:  Level 0  Post Pull Instructions Given:  Yes.    Post Pull Pulses Present:  Yes.    Dressing Applied:  Yes.    Comments:

## 2019-01-16 NOTE — Interval H&P Note (Signed)
History and Physical Interval Note:  01/16/2019 9:37 AM  Lawrence Wallace  has presented today for cardiac cath with the diagnosis of NSTEMI.  The various methods of treatment have been discussed with the patient and family. After consideration of risks, benefits and other options for treatment, the patient has consented to  Procedure(s): LEFT HEART CATH AND CORONARY ANGIOGRAPHY (N/A) as a surgical intervention.  The patient's history has been reviewed, patient examined, no change in status, stable for surgery.  I have reviewed the patient's chart and labs.  Questions were answered to the patient's satisfaction.    Cath Lab Visit (complete for each Cath Lab visit)  Clinical Evaluation Leading to the Procedure:   ACS: Yes.    Non-ACS:    Anginal Classification: CCS III  Anti-ischemic medical therapy: No Therapy  Non-Invasive Test Results: No non-invasive testing performed  Prior CABG: No previous CABG         Lauree Chandler

## 2019-01-16 NOTE — Progress Notes (Signed)
[place] for heparin Indication: chest pain/ACS  No Known Allergies  Patient Measurements: Height: 6\' 2"  (188 cm) Weight: 216 lb 14.4 oz (98.4 kg) IBW/kg (Calculated) : 82.2 Heparin Dosing Weight: 97.5kg  Vital Signs: Temp: 98.4 F (36.9 C) (04/30 1526) Temp Source: Oral (04/30 1526) BP: 150/80 (04/30 1526) Pulse Rate: 89 (04/30 1526)  Labs: Recent Labs    01/15/19 1615 01/15/19 2112 01/16/19 0007 01/16/19 0355 01/16/19 0742  HGB 17.7*  --   --  15.9 15.9  HCT 52.1*  --   --  46.2 46.7  PLT 234  --   --  211 220  HEPARINUNFRC  --   --  0.15*  --  0.28*  CREATININE 0.84  --   --   --  0.76  TROPONINI 0.52* 3.30*  --  14.94* 18.14*    Estimated Creatinine Clearance: 108.5 mL/min (by C-G formula based on SCr of 0.76 mg/dL).   Medical History: Past Medical History:  Diagnosis Date  . Asthma   . Cancer ([place])   . GERD (gastroesophageal reflux disease)   . NSTEMI (non-ST elevated myocardial infarction) ([place]) 01/15/2019  . Tobacco abuse     Medications:  Infusions:  . sodium chloride 75 mL/hr (01/16/19 1428)  . sodium chloride    . tirofiban      Assessment: 47 yom presented to the ED with CP. Troponin elevated and now starting IV heparin. Baseline CBC is ok and he is not on anticoagulation PTA.   Heparin level was slightly below goal this AM, but then turned off for cath procedure.  S/p PCI in cath lab, no indication to resume heparin after.  Goal of Therapy:  Heparin level 0.3-0.7 units/ml Monitor platelets by anticoagulation protocol: Yes   Plan:  D/c heparin.  Marguerite Olea, [facility] Clinical Pharmacist Phone [phone]  01/16/2019 3:45 PM

## 2019-01-16 NOTE — Progress Notes (Signed)
Progress Note  Patient Name: Lawrence Wallace Date of Encounter: 01/16/2019  Primary Cardiologist: New  Subjective   Chest pain resolved early this morning.  No shortness of breath.  No other complaints.  Inpatient Medications    Scheduled Meds: . [START ON 01/17/2019] aspirin EC  81 mg Oral Daily  . atorvastatin  80 mg Oral q1800  . metoprolol tartrate  25 mg Oral BID  . pneumococcal 23 valent vaccine  0.5 mL Intramuscular Tomorrow-1000  . sodium chloride flush  3 mL Intravenous Q12H   Continuous Infusions: . sodium chloride    . sodium chloride 1 mL/kg/hr (01/16/19 0655)  . heparin 1,450 Units/hr (01/16/19 0827)  . nitroGLYCERIN Stopped (01/16/19 0537)   PRN Meds: sodium chloride, acetaminophen, nitroGLYCERIN, sodium chloride flush   Vital Signs    Vitals:   01/16/19 0130 01/16/19 0200 01/16/19 0230 01/16/19 0700  BP: 126/73 123/89 127/85 119/74  Pulse: 69  65 84  Resp:    16  Temp:    98.4 F (36.9 C)  TempSrc:    Oral  SpO2: 95%  93% 96%  Weight:    98.4 kg  Height:        Intake/Output Summary (Last 24 hours) at 01/16/2019 0830 Last data filed at 01/16/2019 0210 Gross per 24 hour  Intake 240 ml  Output 650 ml  Net -410 ml   Last 3 Weights 01/16/2019 01/15/2019 01/15/2019  Weight (lbs) 216 lb 14.4 oz 216 lb 11.2 oz 215 lb  Weight (kg) 98.385 kg 98.294 kg 97.523 kg      Telemetry    Normal sinus rhythm without significant arrhythmia - Personally Reviewed  ECG    01/15/19 Sinus rhythm Minimal ST depression, anterolateral leads lateral depression- Personally Reviewed  Physical Exam  Alert, oriented male in no distress GEN: No acute distress.   Neck: No JVD Cardiac: RRR, no murmurs, rubs, or gallops.  Respiratory: Clear to auscultation bilaterally. GI: Soft, nontender, non-distended  MS: No edema; No deformity. Neuro:  Nonfocal  Psych: Normal affect   Labs    Chemistry Recent Labs  Lab 01/15/19 1615 01/15/19 1748  NA 134*  --   K 3.9  --    CL 100  --   CO2 22  --   GLUCOSE 125*  --   BUN 7*  --   CREATININE 0.84  --   CALCIUM 9.6  --   PROT  --  7.8  ALBUMIN  --  4.1  AST  --  33  ALT  --  21  ALKPHOS  --  48  BILITOT  --  0.8  GFRNONAA >60  --   GFRAA >60  --   ANIONGAP 12  --      Hematology Recent Labs  Lab 01/15/19 1615 01/16/19 0355 01/16/19 0742  WBC 10.0 11.0* 11.2*  RBC 5.47 4.96 5.03  HGB 17.7* 15.9 15.9  HCT 52.1* 46.2 46.7  MCV 95.2 93.1 92.8  MCH 32.4 32.1 31.6  MCHC 34.0 34.4 34.0  RDW 12.6 12.4 12.4  PLT 234 211 220    Cardiac Enzymes Recent Labs  Lab 01/15/19 1615 01/15/19 2112 01/16/19 0355  TROPONINI 0.52* 3.30* 14.94*   No results for input(s): TROPIPOC in the last 168 hours.   BNPNo results for input(s): BNP, PROBNP in the last 168 hours.   DDimer No results for input(s): DDIMER in the last 168 hours.   Radiology    Dg Chest 2 View  Result Date:  01/15/2019 CLINICAL DATA:  Chest pain EXAM: CHEST - 2 VIEW COMPARISON:  None. FINDINGS: COPD with pulmonary hyperinflation. Prominent interstitial lung markings bilaterally could be acute or chronic. Probable scarring however interstitial edema possible. Heart size and vascularity normal. No pleural effusion. No lobar infiltrate. Chronic left rib fractures. IMPRESSION: COPD. Prominent interstitial markings likely due to chronic lung disease however no prior studies for comparison. Electronically Signed   By: Franchot Gallo M.D.   On: 01/15/2019 16:53    Cardiac Studies   Cath- pending  2D Echo pending   Patient Profile     63 y.o. male, who is a longtime smoker, but no other past medical history who presented to [facility] with chief complaint of chest pain and ruled in for non-STEMI and found to be hypertensive.  Assessment & Plan    1.  Non-STEMI: Initial troponin was 0.52.  He had significant bump to  3.30>>>14.9.  He remains chest pain-free on IV heparin and IV nitro.  Plan for cardiac catheterization today.   Continue to treat with aspirin, high potency statin and beta-blocker.  Echocardiogram also pending.  2.  Hypertension: Blood pressure was elevated in the ED in the 893Y systolic.  He was started on IV nitro for chest pain and hypertension as well as 25 mg of metoprolol tartrate.  Blood pressure is well controlled this morning.  Continue to monitor.  3.  Hyperlipidemia: Fasting lipid panel today showed elevated LDL at 109 mg/dL.  Triglycerides were also elevated at 248 mg/dL.  Given non-STEMI and presumed coronary artery disease (cath pending), will continue to treat with high potency statin.  Atorvastatin 80 mg has been initiated.  He will need fasting lipid panel and hepatic function test again in another 6 to 8 weeks to reassess.  LDL goal will be less than 70 mg/dL.  4.  Tobacco abuse: Smoking cessation strongly advised.  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   For questions or updates, please contact [place] Please consult [email or web address] for contact info under     Signed, Lyda Jester, PA-C  01/16/2019, 8:30 AM    Patient seen, examined. Available data reviewed. Agree with findings, assessment, and plan as outlined by Lyda Jester, PA-C.  The exam findings outlined above reflects my personal exam of this patient this morning.  His chest pain has resolved.  EKG is unchanged with submillimeter ST depression anterolaterally.  The patient's troponin has trended up to 15 mg/dL.  We have again reviewed risks, indications, and expectations with cardiac catheterization and PCI.  He understands and agrees to proceed.  We will try to discharge him home this evening after his catheterization and intervention if possible.  Otherwise as outlined above.  Sherren Mocha, M.D. 01/16/2019 9:37 AM

## 2019-01-16 NOTE — Progress Notes (Signed)
ANTICOAGULATION CONSULT NOTE  Pharmacy Consult for heparin Indication: chest pain/ACS  No Known Allergies  Patient Measurements: Height: 6\' 2"  (188 cm) Weight: 216 lb 11.2 oz (98.3 kg) IBW/kg (Calculated) : 82.2 Heparin Dosing Weight: 97.5kg  Vital Signs: Temp: 98.2 F (36.8 C) (04/29 2034) Temp Source: Oral (04/29 2034) BP: 138/80 (04/29 2330) Pulse Rate: 73 (04/29 2330)  Labs: Recent Labs    01/15/19 1615 01/15/19 2112 01/16/19 0007  HGB 17.7*  --   --   HCT 52.1*  --   --   PLT 234  --   --   HEPARINUNFRC  --   --  0.15*  CREATININE 0.84  --   --   TROPONINI 0.52* 3.30*  --     Estimated Creatinine Clearance: 103.3 mL/min (by C-G formula based on SCr of 0.84 mg/dL).   Medical History: Past Medical History:  Diagnosis Date  . Asthma   . Cancer ([place])   . GERD (gastroesophageal reflux disease)   . NSTEMI (non-ST elevated myocardial infarction) ([place]) 01/15/2019  . Tobacco abuse     Medications:  Infusions:  . sodium chloride    . sodium chloride     Followed by  . sodium chloride    . heparin 1,250 Units/hr (01/15/19 1811)  . nitroGLYCERIN 40 mcg/min (01/16/19 0020)    Assessment: 51 yom presented to the ED with CP. Troponin elevated and now starting IV heparin. Baseline CBC is ok and he is not on anticoagulation PTA.  Initial heparin level 0.15 units/ml  Goal of Therapy:  Heparin level 0.3-0.7 units/ml Monitor platelets by anticoagulation protocol: Yes   Plan:  Heparin bolus 2000 units IV x 1 Increase heparin gtt 1450 units/hr Check a 6 hr heparin level Daily heparin level and CBC  Lopez Dentinger Poteet 01/16/2019,1:08 AM

## 2019-01-17 ENCOUNTER — Encounter (HOSPITAL_COMMUNITY): Payer: Self-pay | Admitting: Cardiovascular Disease

## 2019-01-17 ENCOUNTER — Telehealth: Payer: Self-pay | Admitting: Physician Assistant

## 2019-01-17 DIAGNOSIS — E785 Hyperlipidemia, unspecified: Secondary | ICD-10-CM

## 2019-01-17 DIAGNOSIS — I2510 Atherosclerotic heart disease of native coronary artery without angina pectoris: Secondary | ICD-10-CM

## 2019-01-17 DIAGNOSIS — I10 Essential (primary) hypertension: Secondary | ICD-10-CM

## 2019-01-17 HISTORY — DX: Essential (primary) hypertension: I10

## 2019-01-17 HISTORY — DX: Hyperlipidemia, unspecified: E78.5

## 2019-01-17 LAB — POCT ACTIVATED CLOTTING TIME
Activated Clotting Time: 279 seconds
Activated Clotting Time: 323 seconds
Activated Clotting Time: 324 seconds
Activated Clotting Time: 494 seconds

## 2019-01-17 LAB — BASIC METABOLIC PANEL
Anion gap: 12 (ref 5–15)
BUN: 7 mg/dL — ABNORMAL LOW (ref 8–23)
CO2: 19 mmol/L — ABNORMAL LOW (ref 22–32)
Calcium: 9 mg/dL (ref 8.9–10.3)
Chloride: 105 mmol/L (ref 98–111)
Creatinine, Ser: 0.72 mg/dL (ref 0.61–1.24)
GFR calc Af Amer: >60 mL/min (ref >60)
GFR calc non Af Amer: >60 mL/min (ref >60)
Glucose, Bld: 114 mg/dL — ABNORMAL HIGH (ref 70–99)
Potassium: 3.9 mmol/L (ref 3.5–5.1)
Sodium: 136 mmol/L (ref 135–145)

## 2019-01-17 LAB — CBC
HCT: 47.1 % (ref 39.0–52.0)
Hemoglobin: 16 g/dL (ref 13.0–17.0)
MCH: 31.6 pg (ref 26.0–34.0)
MCHC: 34 g/dL (ref 30.0–36.0)
MCV: 92.9 fL (ref 80.0–100.0)
Platelets: 215 10*3/uL (ref 150–400)
RBC: 5.07 MIL/uL (ref 4.22–5.81)
RDW: 12.8 % (ref 11.5–15.5)
WBC: 9.7 10*3/uL (ref 4.0–10.5)
nRBC: 0 % (ref 0.0–0.2)

## 2019-01-17 MED ORDER — NITROGLYCERIN 0.4 MG SL SUBL: 0.4000 mg | SUBLINGUAL_TABLET | SUBLINGUAL | 12 refills | Status: DC | PRN

## 2019-01-17 MED ORDER — ATORVASTATIN CALCIUM 80 MG PO TABS: 80.0000 mg | ORAL_TABLET | Freq: Every day | ORAL | 6 refills | Status: DC

## 2019-01-17 MED ORDER — ASPIRIN 81 MG PO TBEC: 81.0000 mg | DELAYED_RELEASE_TABLET | Freq: Every day | ORAL | 3 refills | Status: DC

## 2019-01-17 MED ORDER — TICAGRELOR 90 MG PO TABS: 90.0000 mg | ORAL_TABLET | Freq: Two times a day (BID) | ORAL | 6 refills | Status: DC

## 2019-01-17 MED ORDER — METOPROLOL TARTRATE 25 MG PO TABS: 25.0000 mg | ORAL_TABLET | Freq: Two times a day (BID) | ORAL | 6 refills | Status: DC

## 2019-01-17 MED FILL — METOPROLOL TARTRATE 25 MG T: 25 | 30 days supply | Qty: 60 | Fill #0 | Status: TO

## 2019-01-17 MED FILL — ASPIRIN LOW DOSE 81 MG TBEC: 81 | 30 days supply | Qty: 30 | Fill #0 | Status: TO

## 2019-01-17 MED FILL — ATORVASTATIN CALCIUM 80 MG: 80 | 30 days supply | Qty: 30 | Fill #0 | Status: TO

## 2019-01-17 MED FILL — NITROGLYCERIN 0.4 MG TAB SL: 0.4 | 8 days supply | Qty: 25 | Fill #0 | Status: TO

## 2019-01-17 MED FILL — BRILINTA 90 MG TABLET: 90 | 30 days supply | Qty: 60 | Fill #0 | Status: TO

## 2019-01-17 NOTE — Progress Notes (Signed)
Progress Note  Patient Name: Lawrence Wallace Date of Encounter: 01/17/2019  Primary Cardiologist:New  Subjective   Patient feels well today.  No chest pain or shortness of breath.  Eager to go home.  Inpatient Medications    Scheduled Meds: . aspirin EC  81 mg Oral Daily  . atorvastatin  80 mg Oral q1800  . metoprolol tartrate  25 mg Oral BID  . pneumococcal 23 valent vaccine  0.5 mL Intramuscular Tomorrow-1000  . sodium chloride flush  3 mL Intravenous Q12H  . ticagrelor  90 mg Oral BID   Continuous Infusions: . sodium chloride     PRN Meds: sodium chloride, acetaminophen, albuterol, nitroGLYCERIN, sodium chloride flush   Vital Signs    Vitals:   01/16/19 2147 01/16/19 2149 01/17/19 0545 01/17/19 0546  BP: (!) 144/84 (!) 144/84 (!) 137/92   Pulse: 96 96 100   Resp:      Temp:  98.6 F (37 C) 98.1 F (36.7 C)   TempSrc:  Oral Oral   SpO2:  97% 98%   Weight:    95.9 kg  Height:        Intake/Output Summary (Last 24 hours) at 01/17/2019 0758 Last data filed at 01/17/2019 0300 Gross per 24 hour  Intake 2290.42 ml  Output 1500 ml  Net 790.42 ml   Last 3 Weights 01/17/2019 01/16/2019 01/15/2019  Weight (lbs) 211 lb 8 oz 216 lb 14.4 oz 216 lb 11.2 oz  Weight (kg) 95.936 kg 98.385 kg 98.294 kg      Telemetry    Normal sinus rhythm without arrhythmia - Personally Reviewed  ECG    Normal sinus rhythm.  Age-indeterminate inferoposterior MI with residual ST elevation - Personally Reviewed  Physical Exam  Alert, oriented male in no distress GEN: No acute distress.   Neck: No JVD Cardiac: RRR, no murmurs, rubs, or gallops.  Respiratory: Clear to auscultation bilaterally. GI: Soft, nontender, non-distended  MS: No edema; No deformity.  Right groin ecchymosis without firm hematoma Neuro:  Nonfocal  Psych: Normal affect   Labs    Chemistry Recent Labs  Lab 01/15/19 1615 01/15/19 1748 01/16/19 0742 01/17/19 0319  NA 134*  --  135 136  K 3.9  --  4.1 3.9  CL  100  --  103 105  CO2 22  --  21* 19*  GLUCOSE 125*  --  112* 114*  BUN 7*  --  8 7*  CREATININE 0.84  --  0.76 0.72  CALCIUM 9.6  --  8.9 9.0  PROT  --  7.8  --   --   ALBUMIN  --  4.1  --   --   AST  --  33  --   --   ALT  --  21  --   --   ALKPHOS  --  48  --   --   BILITOT  --  0.8  --   --   GFRNONAA >60  --  >60 >60  GFRAA >60  --  >60 >60  ANIONGAP 12  --  11 12     Hematology Recent Labs  Lab 01/16/19 0355 01/16/19 0742 01/17/19 0319  WBC 11.0* 11.2* 9.7  RBC 4.96 5.03 5.07  HGB 15.9 15.9 16.0  HCT 46.2 46.7 47.1  MCV 93.1 92.8 92.9  MCH 32.1 31.6 31.6  MCHC 34.4 34.0 34.0  RDW 12.4 12.4 12.8  PLT 211 220 215    Cardiac Enzymes Recent Labs  Lab 01/15/19 1615 01/15/19 2112 01/16/19 0355 01/16/19 0742  TROPONINI 0.52* 3.30* 14.94* 18.14*     Radiology    Dg Chest 2 View  Result Date: 01/15/2019 CLINICAL DATA:  Chest pain EXAM: CHEST - 2 VIEW COMPARISON:  None. FINDINGS: COPD with pulmonary hyperinflation. Prominent interstitial lung markings bilaterally could be acute or chronic. Probable scarring however interstitial edema possible. Heart size and vascularity normal. No pleural effusion. No lobar infiltrate. Chronic left rib fractures. IMPRESSION: COPD. Prominent interstitial markings likely due to chronic lung disease however no prior studies for comparison. Electronically Signed   By: Franchot Gallo M.D.   On: 01/15/2019 16:53    Cardiac Studies   CORONARY STENT INTERVENTION  LEFT HEART CATH AND CORONARY ANGIOGRAPHY  Conclusion     Mid RCA lesion is 60% stenosed.  Prox Cx lesion is 100% stenosed.  Ost Cx to Prox Cx lesion is 50% stenosed.  Prox LAD lesion is 50% stenosed.  Mid LAD lesion is 50% stenosed.  A drug-eluting stent was successfully placed using a STENT SYNERGY DES 2.5X16.  Post intervention, there is a 0% residual stenosis.  The left ventricular systolic function is normal.  LV end diastolic pressure is normal.  The  left ventricular ejection fraction is greater than 65% by visual estimate.  There is no mitral valve regurgitation.   1. Total occlusion of the mid Circumflex artery. 2. Successful PTCA/DES x 1 mid Circumflex 3. Moderate non-obstructive disease in the mid RCA and mid LAD 4. Normal LV systolic function  Recommendations: Will continue DAPT with ASA and Brilinta for one year. Continue high intensity statin. Aggrastat for 2 hours.    Echo 01/16/19 IMPRESSIONS    1. The left ventricle has normal systolic function with an ejection fraction of 60-65%. The cavity size was normal. Left ventricular diastolic Doppler parameters are consistent with pseudonormalization.  2. Extremely technically difficult echo with poor image quality.  3. The right ventricle has normal systolic function. The cavity was normal. There is no increase in right ventricular wall thickness.  4. Left atrial size was not well visualized.  5. The aortic root and ascending aorta are normal in size and structure.  6. The interatrial septum was not assessed.  Patient Profile     63 y.o. male, who is a longtime smoker, but no other past medical history who presented to [facility] with chief complaint of chest pain and ruled in for non-STEMI and found to be hypertensive.  Assessment & Plan    1.  Non-STEMI: Initial troponin was 0.52.  He had significant bump to  3.30>>>14.9>>18.14.  He remains chest pain-free on IV heparin and IV nitro.  Cath showed total occlusion of the mid Circumflex artery s/p successful PTCA/DES x 1. Moderate non-obstructive disease in the mid RCA and mid LAD. DAPT with ASA and Brillinta x 1 year. He some dyspnea after Brillinta load yesterday>> given cup of coffee. Echo showed preserved LV function. Renal function stable. - Continue statin and BB.  2.  Hypertension: Blood pressure was elevated in the ED in the 357S systolic.  He was started on IV nitro for chest pain and hypertension as well as  25 mg of metoprolol tartrate.  BP is improving on BB.   3.  Hyperlipidemia:01/16/2019: Cholesterol 197; HDL 38; LDL Cholesterol 109; Triglycerides 248; VLDL 50  - Continue high intensity statin  4.  Tobacco abuse: Smoking cessation strongly advised.     For questions or updates, please contact [place]  Please consult [email or web address] for contact info under     SignedLeanor Kail, [place]  01/17/2019, 7:58 AM    Patient seen, examined. Available data reviewed. Agree with findings, assessment, and plan as outlined by Robbie Lis, PA-C. My exam findings from this morning's exam are documented above.  The patient is doing well following PCI yesterday.  His EKG does show an inferoposterior infarct with residual ST elevation but he has no symptoms and appears stable for discharge.  LV function is normal by echo.  We discussed tobacco cessation.  We discussed the importance of medication adherence.  The patient is uninsured and I think long-term ticagrelor will be difficult for him.  We will try to get him out on aspirin and ticagrelor for 30 days with tentative plans to transition him to clopidogrel after his 30-day supply is completed.  Sherren Mocha, M.D. 01/17/2019 10:46 AM

## 2019-01-17 NOTE — Progress Notes (Signed)
Cardiac Rehab Advisory Cardiac Rehab Phase I is not seeing pts face to face at this time due to Covid 19 restrictions. Ambulation is occurring through nursing, PT, and mobility teams. We will help facilitate that process as needed. We are calling pts in their rooms and discussing education. We will then deliver education materials to pts RN for delivery to pt.   Spoke with pt by phone. He is feeling well this am. Has been up in room. Encouraged walking [place] with staff. Discussed MI, stent, Brilinta/ASA and importance of other meds, smoking cessation, diet, exercising at home, NTG, and CRPII. Pt very receptive. He understands his need for Brilinta. He is wanting to quit smoking along with his wife. They are cleaning out the house and throwing away cigarettes. Discussed tactics with him and will give resources. Will refer to [place] for when the program opens again. Will deliver materials to his RN. [phone] Lawrence Wallace CES, ACSM 8:57 AM 01/17/2019

## 2019-01-17 NOTE — TOC Transition Note (Signed)
Transition of Care [facility]) - CM/SW Discharge Note   Patient Details  Name: Lawrence Wallace MRN: [id number] Date of Birth: 02-May-1956  Transition of Care [facility]) CM/SW Contact:  Bethena Roys, RN Phone Number: 01/17/2019, 10:51 AM   Clinical Narrative:  Pt presented for Nstemi- plan for home on Brilinta. Medication Rx's should go to [facility]. Pt has PCP at [facility]. 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 No insurance at this time, however pays out of pocket. No further transition of care needs identified at this time.     Final next level of care: Home/Self Care Barriers to Discharge: No Barriers Identified   Patient Goals and CMS Choice Patient states their goals for this hospitalization and ongoing recovery are:: to get home CMS Medicare.gov Compare Post Acute Care list provided to:: (n/a) Choice offered to / list presented to : NA   Discharge Plan and Services In-house Referral: NA Discharge Planning Services: CM Consult Post Acute Care Choice: NA          DME Arranged: N/A DME Agency: NA       HH Arranged: NA HH Agency: NA        Social Determinants of Health (SDOH) Interventions     Readmission Risk Interventions No flowsheet data found.

## 2019-01-17 NOTE — Discharge Summary (Signed)
Discharge Summary    Patient ID: Lawrence Wallace MRN: [id number]; DOB: March 02, 1956  Admit date: 01/15/2019 Discharge date: 01/17/2019  Primary Care Provider: Pleas Koch, NP  Primary Cardiologist: Sherren Mocha, MD   Discharge Diagnoses    Principal Problem:   NSTEMI (non-ST elevated myocardial infarction) [facility]) Active Problems:   Tobacco abuse   Hypertension   Hyperlipidemia LDL goal <70   CAD in native artery   Allergies No Known Allergies  Diagnostic Studies/Procedures    CORONARY STENT INTERVENTION  LEFT HEART CATH AND CORONARY ANGIOGRAPHY  Conclusion     Mid RCA lesion is 60% stenosed.  Prox Cx lesion is 100% stenosed.  Ost Cx to Prox Cx lesion is 50% stenosed.  Prox LAD lesion is 50% stenosed.  Mid LAD lesion is 50% stenosed.  A drug-eluting stent was successfully placed using a STENT SYNERGY DES 2.5X16.  Post intervention, there is a 0% residual stenosis.  The left ventricular systolic function is normal.  LV end diastolic pressure is normal.  The left ventricular ejection fraction is greater than 65% by visual estimate.  There is no mitral valve regurgitation.  1. Total occlusion of the mid Circumflex artery. 2. Successful PTCA/DES x 1 mid Circumflex 3. Moderate non-obstructive disease in the mid RCA and mid LAD 4. Normal LV systolic function  Recommendations: Will continue DAPT with ASA and Brilinta for one year. Continue high intensity statin. Aggrastat for 2 hours.    Echo 01/16/19 IMPRESSIONS   1. The left ventricle has normal systolic function with an ejection fraction of 60-65%. The cavity size was normal. Left ventricular diastolic Doppler parameters are consistent with pseudonormalization. 2. Extremely technically difficult echo with poor image quality. 3. The right ventricle has normal systolic function. The cavity was normal. There is no increase in right ventricular wall thickness. 4. Left atrial size was not well  visualized. 5. The aortic root and ascending aorta are normal in size and structure. 6. The interatrial septum was not assessed.    History of Present Illness     63 y.o.male,who is a longtime smoker, but no other past medical history who presented to [facility] with chief complaint of chest pain and ruled in for non-STEMI and found to be hypertensive.  Hospital Course     Consultants: None  1. Non-STEMI: Initial troponin was0.52.He had significant bump to 3.30>>>14.9>>18.14.  Treated with IV heparin and IV nitro with resolved chest pain. Cath showed total occlusion of the mid Circumflex artery s/p successful PTCA/DES x 1. Moderate non-obstructive disease in the mid RCA and mid LAD. DAPT with ASA and Brillinta x 1 year. He some dyspnea after Brillinta load yesterday>> given cup of coffee with improvement. Echo showed preserved LV function. Renal function stable post cath.  The patient is uninsured and long-term ticagrelor will be difficult for him.  We will try to get him out on aspirin and ticagrelor for 30 days with tentative plans to transition him to clopidogrel after his 30-day supply is completed. His EKG does showed an inferoposterior infarct with residual ST elevation at discharge but he has no symptoms and appears stable for discharge. Continue statin and BB.  Discussed the importance of medication adherence.   2. Hypertension: Blood pressure was elevated in the ED in the 180ssystolic. He was started on IV nitro for chest pain and hypertension as well as 25 mg of metoprolol tartrate. BP is improving on BB.   3. Hyperlipidemia:01/16/2019: Cholesterol 197; HDL 38; LDL Cholesterol 109; Triglycerides  248; VLDL 50 .Continue high intensity statin. Repeat labs in few weeks.  4. Tobacco abuse: Smoking cessation strongly advised.   The patient been seen by Dr. Burt Knack today and deemed ready for discharge home. All follow-up appointments have been scheduled. Discharge  medications are listed below.   Discharge Vitals Blood pressure (!) 157/85, pulse 89, temperature 98.1 F (36.7 C), temperature source Oral, resp. rate 15, height 6\' 2"  (1.88 m), weight 95.9 kg, SpO2 98 %.  Filed Weights   01/15/19 2034 01/16/19 0700 01/17/19 0546  Weight: 98.3 kg 98.4 kg 95.9 kg    Labs & Radiologic Studies    CBC Recent Labs    01/16/19 0742 01/17/19 0319  WBC 11.2* 9.7  HGB 15.9 16.0  HCT 46.7 47.1  MCV 92.8 92.9  PLT 220 759   Basic Metabolic Panel Recent Labs    01/16/19 0742 01/17/19 0319  NA 135 136  K 4.1 3.9  CL 103 105  CO2 21* 19*  GLUCOSE 112* 114*  BUN 8 7*  CREATININE 0.76 0.72  CALCIUM 8.9 9.0   Liver Function Tests Recent Labs    01/15/19 1748  AST 33  ALT 21  ALKPHOS 48  BILITOT 0.8  PROT 7.8  ALBUMIN 4.1   Recent Labs    01/15/19 1748  LIPASE 36   Cardiac Enzymes Recent Labs    01/15/19 2112 01/16/19 0355 01/16/19 0742  TROPONINI 3.30* 14.94* 18.14*   Fasting Lipid Panel Recent Labs    01/16/19 0355  CHOL 197  HDL 38*  LDLCALC 109*  TRIG 248*  CHOLHDL 5.2   Thyroid Function Tests No results for input(s): TSH, T4TOTAL, T3FREE, THYROIDAB in the last 72 hours.  Invalid input(s): FREET3 _____________  Dg Chest 2 View  Result Date: 01/15/2019 CLINICAL DATA:  Chest pain EXAM: CHEST - 2 VIEW COMPARISON:  None. FINDINGS: COPD with pulmonary hyperinflation. Prominent interstitial lung markings bilaterally could be acute or chronic. Probable scarring however interstitial edema possible. Heart size and vascularity normal. No pleural effusion. No lobar infiltrate. Chronic left rib fractures. IMPRESSION: COPD. Prominent interstitial markings likely due to chronic lung disease however no prior studies for comparison. Electronically Signed   By: Franchot Gallo M.D.   On: 01/15/2019 16:53   Disposition   Pt is being discharged home today in good condition.  Follow-up Plans & Appointments    Follow-up Information     [place], Crista Luria, [place] Follow up on 01/28/2019.   Specialty:  Cardiology Why:  @10am  for vitual visit for hospital follow up Contact information: [place] [id number] [phone]          Discharge Instructions    Amb Referral to Cardiac Rehabilitation   Complete by:  As directed    Diagnosis:   Coronary Stents PTCA NSTEMI     Diet - low sodium heart healthy   Complete by:  As directed    Discharge instructions   Complete by:  As directed    No driving for 2 weeks. No lifting over 10 lbs for 4 weeks. No sexual activity for 4 weeks. You may not return to work until cleared by your cardiologist. Keep procedure site clean & dry. If you notice increased pain, swelling, bleeding or pus, call/return!  You may shower, but no soaking baths/hot tubs/pools for 1 week.   Increase activity slowly   Complete by:  As directed       Discharge Medications   Allergies as of 01/17/2019  No Known Allergies     Medication List    TAKE these medications   albuterol 108 (90 Base) MCG/ACT inhaler Commonly known as:  VENTOLIN HFA Inhale 2 puffs into the lungs every 6 (six) hours as needed for wheezing or shortness of breath.   aspirin 81 MG EC tablet Take 1 tablet (81 mg total) by mouth daily. Start taking on:  Jan 18, 2019   atorvastatin 80 MG tablet Commonly known as:  LIPITOR Take 1 tablet (80 mg total) by mouth daily at 6 PM.   benzonatate 200 MG capsule Commonly known as:  TESSALON Take 1 capsule (200 mg total) by mouth 3 (three) times daily as needed for cough. Do not bite pill   metoprolol tartrate 25 MG tablet Commonly known as:  LOPRESSOR Take 1 tablet (25 mg total) by mouth 2 (two) times daily.   multivitamin tablet Take 1 tablet by mouth daily.   nitroGLYCERIN 0.4 MG SL tablet Commonly known as:  NITROSTAT Place 1 tablet (0.4 mg total) under the tongue every 5 (five) minutes x 3 doses as needed for chest pain.    promethazine-dextromethorphan 6.25-15 MG/5ML syrup Commonly known as:  PROMETHAZINE-DM Take 5 mLs by mouth at bedtime as needed for cough. Caution of sedation   ticagrelor 90 MG Tabs tablet Commonly known as:  BRILINTA Take 1 tablet (90 mg total) by mouth 2 (two) times daily.        Acute coronary syndrome (MI, NSTEMI, STEMI, etc) this admission?: Yes.     AHA/ACC Clinical Performance & Quality Measures: 1. Aspirin prescribed? - Yes 2. ADP Receptor Inhibitor (Plavix/Clopidogrel, Brilinta/Ticagrelor or Effient/Prasugrel) prescribed (includes medically managed patients)? - Yes 3. Beta Blocker prescribed? - Yes 4. High Intensity Statin (Lipitor 40-80mg  or Crestor 20-40mg ) prescribed? - Yes 5. EF assessed during THIS hospitalization? - Yes 6. For EF <40%, was ACEI/ARB prescribed? - Not Applicable (EF >/= 16%) 7. For EF <40%, Aldosterone Antagonist (Spironolactone or Eplerenone) prescribed? - Not Applicable (EF >/= 10%) 8. Cardiac Rehab Phase II ordered (Included Medically managed Patients)? - Yes     Outstanding Labs/Studies   Consider OP f/u labs 6-8 weeks given statin initiation this admission.  Duration of Discharge Encounter   Greater than 30 minutes including physician time.  Jarrett Soho, [place] 01/17/2019, 12:54 PM

## 2019-01-17 NOTE — Telephone Encounter (Signed)
   TELEPHONE CALL NOTE  This patient has been deemed a candidate for follow-up tele-health visit to limit community exposure during the Covid-19 pandemic. I spoke with the patient via phone to discuss instructions. This has been outlined on the patient's AVS (dotphrase: [email or web address]). The patient was advised to review the section on consent for treatment as well. The patient will receive a phone call 2-3 days prior to their E-Visit at which time consent will be verbally confirmed.   A Virtual Office Visit appointment type has been scheduled for hospital follow up with Robbie Lis, with "VIDEO" or "TELEPHONE" in the appointment notes - patient prefers TELEPHONE type.  I have either confirmed the patient is active in MyChart or offered to send sign-up link to phone/email via Mychart icon beside patient's photo.  Leanor Kail, [place] 01/17/2019 12:49 PM

## 2019-01-22 MED FILL — Verapamil HCl IV Soln 2.5 MG/ML: INTRAVENOUS | Qty: 2 | Status: AC

## 2019-01-23 ENCOUNTER — Telehealth: Payer: Self-pay | Admitting: Physician Assistant

## 2019-01-23 NOTE — Telephone Encounter (Signed)
Spoke with patient who confirmed all demographics. Patient does not know how to use his computer and does not want to learn.  He declined My Chart.  He does not have a smart phone.  He will have his vitals, except his BP, ready for visit.     Virtual Visit Pre-Appointment Phone Call  "(Name), I am calling you today to discuss your upcoming appointment. We are currently trying to limit exposure to the virus that causes COVID-19 by seeing patients at home rather than in the office."  1. "What is the BEST phone number to call the day of the visit?" - include this in appointment notes  2. Do you have or have access to (through a family member/friend) a smartphone with video capability that we can use for your visit?" a. If yes - list this number in appt notes as cell (if different from BEST phone #) and list the appointment type as a VIDEO visit in appointment notes b. If no - list the appointment type as a PHONE visit in appointment notes  3. Confirm consent - "In the setting of the current Covid19 crisis, you are scheduled for a (phone or video) visit with your provider on (date) at (time).  Just as we do with many in-office visits, in order for you to participate in this visit, we must obtain consent.  If you'd like, I can send this to your mychart (if signed up) or email for you to review.  Otherwise, I can obtain your verbal consent now.  All virtual visits are billed to your insurance company just like a normal visit would be.  By agreeing to a virtual visit, we'd like you to understand that the technology does not allow for your provider to perform an examination, and thus may limit your provider's ability to fully assess your condition. If your provider identifies any concerns that need to be evaluated in person, we will make arrangements to do so.  Finally, though the technology is pretty good, we cannot assure that it will always work on either your or our end, and in the setting of a video  visit, we may have to convert it to a phone-only visit.  In either situation, we cannot ensure that we have a secure connection.  Are you willing to proceed?"  Patient said "yes". 4. Advise patient to be prepared - "Two hours prior to your appointment, go ahead and check your blood pressure, pulse, oxygen saturation, and your weight (if you have the equipment to check those) and write them all down. When your visit starts, your provider will ask you for this information. If you have an Apple Watch or Kardia device, please plan to have heart rate information ready on the day of your appointment. Please have a pen and paper handy nearby the day of the visit as well."  5. Give patient instructions for MyChart download to smartphone OR Doximity/Doxy.me as below if video visit (depending on what platform provider is using)  6. Inform patient they will receive a phone call 15 minutes prior to their appointment time (may be from unknown caller ID) so they should be prepared to answer    TELEPHONE CALL NOTE  Lawrence Wallace has been deemed a candidate for a follow-up tele-health visit to limit community exposure during the Covid-19 pandemic. I spoke with the patient via phone to ensure availability of phone/video source, confirm preferred email & phone number, and discuss instructions and expectations.  I reminded Lawrence Wallace to be prepared with any vital sign and/or heart rhythm information that could potentially be obtained via home monitoring, at the time of his visit. I reminded Lawrence Wallace to expect a phone call prior to his visit.  Ardelle Anton 01/23/2019 11:23 AM   INSTRUCTIONS FOR DOWNLOADING THE MYCHART APP TO SMARTPHONE  - The patient must first make sure to have activated MyChart and know their login information - If Apple, go to [employer] and type in MyChart in the search bar and download the app. If Android, ask patient to go to [employer] and type in [place] in the search bar and  download the app. The app is free but as with any other app downloads, their phone may require them to verify saved payment information or Apple/Android password.  - The patient will need to then log into the app with their MyChart username and password, and select [facility] as their healthcare provider to link the account. When it is time for your visit, go to the MyChart app, find appointments, and click Begin Video Visit. Be sure to Select Allow for your device to access the Microphone and Camera for your visit. You will then be connected, and your provider will be with you shortly.  **If they have any issues connecting, or need assistance please contact MyChart service desk [phone])**  **If using a computer, in order to ensure the best quality for their visit they will need to use either of the following Internet Browsers: [employer], or Google Chrome**  IF USING DOXIMITY or DOXY.ME - The patient will receive a link just prior to their visit by text.     FULL LENGTH CONSENT FOR TELE-HEALTH VISIT   I hereby voluntarily request, consent and authorize [facility] and its employed or contracted physicians, physician assistants, nurse practitioners or other licensed health care professionals (the Practitioner), to provide me with telemedicine health care services (the Services") as deemed necessary by the treating Practitioner. I acknowledge and consent to receive the Services by the Practitioner via telemedicine. I understand that the telemedicine visit will involve communicating with the Practitioner through live audiovisual communication technology and the disclosure of certain medical information by electronic transmission. I acknowledge that I have been given the opportunity to request an in-person assessment or other available alternative prior to the telemedicine visit and am voluntarily participating in the telemedicine visit.  I understand that I have the right to  withhold or withdraw my consent to the use of telemedicine in the course of my care at any time, without affecting my right to future care or treatment, and that the Practitioner or I may terminate the telemedicine visit at any time. I understand that I have the right to inspect all information obtained and/or recorded in the course of the telemedicine visit and may receive copies of available information for a reasonable fee.  I understand that some of the potential risks of receiving the Services via telemedicine include:   Delay or interruption in medical evaluation due to technological equipment failure or disruption;  Information transmitted may not be sufficient (e.g. poor resolution of images) to allow for appropriate medical decision making by the Practitioner; and/or   In rare instances, security protocols could fail, causing a breach of personal health information.  Furthermore, I acknowledge that it is my responsibility to provide information about my medical history, conditions and care that is complete and accurate to the best of my ability. I acknowledge  that Practitioner's advice, recommendations, and/or decision may be based on factors not within their control, such as incomplete or inaccurate data provided by me or distortions of diagnostic images or specimens that may result from electronic transmissions. I understand that the practice of medicine is not an exact science and that Practitioner makes no warranties or guarantees regarding treatment outcomes. I acknowledge that I will receive a copy of this consent concurrently upon execution via email to the email address I last provided but may also request a printed copy by calling the office of [facility].    I understand that my insurance will be billed for this visit.   I have read or had this consent read to me.  I understand the contents of this consent, which adequately explains the benefits and risks of the Services being  provided via telemedicine.   I have been provided ample opportunity to ask questions regarding this consent and the Services and have had my questions answered to my satisfaction.  I give my informed consent for the services to be provided through the use of telemedicine in my medical care  By participating in this telemedicine visit I agree to the above.

## 2019-01-27 NOTE — Progress Notes (Signed)
Virtual Visit via Telephone Note   This visit type was conducted due to national recommendations for restrictions regarding the COVID-19 Pandemic (e.g. social distancing) in an effort to limit this patient's exposure and mitigate transmission in our community.  Due to his co-morbid illnesses, this patient is at least at moderate risk for complications without adequate follow up.  This format is felt to be most appropriate for this patient at this time.  The patient did not have access to video technology/had technical difficulties with video requiring transitioning to audio format only (telephone).  All issues noted in this document were discussed and addressed.  No physical exam could be performed with this format.  Please refer to the patient's chart for his  consent to telehealth for [facility].   Date:  01/28/2019   ID:  Lawrence Wallace, DOB [birth year], MRN [id number]  Patient Location: Home Provider Location: Home  PCP:  Pleas Koch, NP  Cardiologist:  Sherren Mocha, MD  Evaluation Performed:  Follow-Up Visit  Chief Complaint:  Hospital follow up for CAD  History of Present Illness:    Lawrence Wallace is a 63 y.o. male who is a longtime smoker, but no other past medical history who presented 12/2018 to [facility] with chief complaint of chest pain and ruled in for non-STEMI and found to be hypertensive. Troponin peaked at 18. Treated with IV heparin and IV nitro with resolved chest pain.Cath showed total occlusion of the mid Circumflex arterys/p successful PTCA/DES x 1.Moderate non-obstructive disease in the mid RCA and mid LAD. DAPT with ASA and Brillinta x 1 year. Echo showed preserved LV function. Renal function stable post cath. The patient is uninsured and long-term ticagrelor will be difficult for him. We will try to get him out on aspirin and ticagrelor for 30 days with tentative plans to transition him to clopidogrel after his 30-day supply is completed.    Patient did not smoke cigarette few days after discharge however relapsed back smoking few cigarettes per day.  Patient denies any exertional chest pain however has some fatigue with activity which is improving.  No associated dyspnea or symptoms similar to ER presentation.  He denies orthopnea, PND, syncope, lower extremity edema or melena.  The patient does not have symptoms concerning for COVID-19 infection (fever, chills, cough, or new shortness of breath).    Past Medical History:  Diagnosis Date  . Asthma   . CAD in native artery    a. cath 12/2018 - total occlusion of the mid Circumflex artery s/p successful PTCA/DES x 1. Moderate non-obstructive disease in the mid RCA and mid LAD.  Marland Kitchen Cancer ([place])   . GERD (gastroesophageal reflux disease)   . Hyperlipidemia LDL goal <70 01/17/2019  . Hypertension 01/17/2019  . NSTEMI (non-ST elevated myocardial infarction) ([place]) 01/15/2019  . Tobacco abuse    Past Surgical History:  Procedure Laterality Date  . CORONARY STENT INTERVENTION N/A 01/16/2019   Procedure: CORONARY STENT INTERVENTION;  Surgeon: Burnell Blanks, MD;  Location: [facility];  Service: Cardiovascular;  Laterality: N/A;  . FOOT FRACTURE SURGERY    . LEFT HEART CATH AND CORONARY ANGIOGRAPHY N/A 01/16/2019   Procedure: LEFT HEART CATH AND CORONARY ANGIOGRAPHY;  Surgeon: Burnell Blanks, MD;  Location: [facility];  Service: Cardiovascular;  Laterality: N/A;     Current Meds  Medication Sig  . aspirin EC 81 MG EC tablet Take 1 tablet (81 mg total) by mouth daily.  Marland Kitchen  atorvastatin (LIPITOR) 80 MG tablet Take 1 tablet (80 mg total) by mouth daily at 6 PM.  . metoprolol tartrate (LOPRESSOR) 25 MG tablet Take 1 tablet (25 mg total) by mouth 2 (two) times daily.  . Multiple Vitamin (MULTIVITAMIN) tablet Take 1 tablet by mouth daily.  . nitroGLYCERIN (NITROSTAT) 0.4 MG SL tablet Place 1 tablet (0.4 mg total) under the tongue every 5 (five) minutes x 3 doses  as needed for chest pain.  . [DISCONTINUED] ticagrelor (BRILINTA) 90 MG TABS tablet Take 1 tablet (90 mg total) by mouth 2 (two) times daily.     Allergies:   Patient has no known allergies.   Social History   Tobacco Use  . Smoking status: Current Every Day Smoker    Packs/day: 1.50    Years: 45.00    Pack years: 67.50    Types: Cigarettes  . Smokeless tobacco: Never Used  Substance Use Topics  . Alcohol use: Yes    Alcohol/week: 1.0 standard drinks    Types: 1 Shots of liquor per week    Comment: 1 mixed liquor every night  . Drug use: Not on file     Family Hx: The patient's family history includes Cancer in his father and mother; Stroke in his mother.  ROS:   Please see the history of present illness.    All other systems reviewed and are negative.   Prior CV studies:   The following studies were reviewed today:  CORONARY STENT INTERVENTION  LEFT HEART CATH AND CORONARY ANGIOGRAPHY  Conclusion     Mid RCA lesion is 60% stenosed.  Prox Cx lesion is 100% stenosed.  Ost Cx to Prox Cx lesion is 50% stenosed.  Prox LAD lesion is 50% stenosed.  Mid LAD lesion is 50% stenosed.  A drug-eluting stent was successfully placed using a STENT SYNERGY DES 2.5X16.  Post intervention, there is a 0% residual stenosis.  The left ventricular systolic function is normal.  LV end diastolic pressure is normal.  The left ventricular ejection fraction is greater than 65% by visual estimate.  There is no mitral valve regurgitation.  1. Total occlusion of the mid Circumflex artery. 2. Successful PTCA/DES x 1 mid Circumflex 3. Moderate non-obstructive disease in the mid RCA and mid LAD 4. Normal LV systolic function  Recommendations: Will continue DAPT with ASA and Brilinta for one year. Continue high intensity statin. Aggrastat for 2 hours.   Echo 01/16/19 IMPRESSIONS   1. The left ventricle has normal systolic function with an ejection fraction of 60-65%.  The cavity size was normal. Left ventricular diastolic Doppler parameters are consistent with pseudonormalization. 2. Extremely technically difficult echo with poor image quality. 3. The right ventricle has normal systolic function. The cavity was normal. There is no increase in right ventricular wall thickness. 4. Left atrial size was not well visualized. 5. The aortic root and ascending aorta are normal in size and structure. 6. The interatrial septum was not assessed.   Labs/Other Tests and Data Reviewed:    EKG:  No ECG reviewed.  Recent Labs: 01/15/2019: ALT 21 01/17/2019: BUN 7; Creatinine, Ser 0.72; Hemoglobin 16.0; Platelets 215; Potassium 3.9; Sodium 136   Recent Lipid Panel Lab Results  Component Value Date/Time   CHOL 197 01/16/2019 03:55 AM   TRIG 248 (H) 01/16/2019 03:55 AM   HDL 38 (L) 01/16/2019 03:55 AM   CHOLHDL 5.2 01/16/2019 03:55 AM   LDLCALC 109 (H) 01/16/2019 03:55 AM   LDLDIRECT 125.0 12/10/2017 09:20 AM  Wt Readings from Last 3 Encounters:  01/28/19 211 lb (95.7 kg)  01/17/19 211 lb 8 oz (95.9 kg)  05/29/18 220 lb (99.8 kg)     Objective:    Vital Signs:  Ht 6\' 2"  (1.88 m)   Wt 211 lb (95.7 kg)   BMI 27.09 kg/m    VITAL SIGNS:  reviewed GEN:  no acute distress PSYCH:  normal affect  ASSESSMENT & PLAN:    1. CAD s/p DES to mCx - medical therapy for Moderate non-obstructive disease in the mid RCA and mid LAD.  On DAPT with ASA and Brillinta. Will change to Plavix (once out of 30 days supply) due to lack of insurance.   2 HTN -Says he does not have a blood pressure cuff.  However will call us back tomorrow with reading after checking with friend.  3. HLD - LDL 109. Continue high intensity statin. Repeat labs in 6 weeks.   4. Tobacco abuse -Encouraged complete cessation.  Education given.  COVID-19 Education: The signs and symptoms of COVID-19 were discussed with the patient and how to seek care for testing (follow up with PCP or  arrange E-visit).  The importance of social distancing was discussed today.  Time:   Today, I have spent 7  minutes with the patient with telehealth technology discussing the above problems.     Medication Adjustments/Labs and Tests Ordered: Current medicines are reviewed at length with the patient today.  Concerns regarding medicines are outlined above.   Tests Ordered: Orders Placed This Encounter  Procedures  . Hepatic function panel  . Lipid panel    Medication Changes: Meds ordered this encounter  Medications  . clopidogrel (PLAVIX) 75 MG tablet    Sig: TAKE 4 TABLETS BY MOUTH ON DAY 1 , TAKE 1 TABLET BY MOUTH DAILY THEREAFTER    Dispense:  94 tablet    Refill:  3    Disposition:  Follow up in 4 month(s)  Signed, Leanor Kail, PA  01/28/2019 10:28 AM     [facility]

## 2019-01-28 ENCOUNTER — Telehealth (INDEPENDENT_AMBULATORY_CARE_PROVIDER_SITE_OTHER): Payer: Self-pay | Admitting: Physician Assistant

## 2019-01-28 ENCOUNTER — Encounter: Payer: Self-pay | Admitting: Physician Assistant

## 2019-01-28 ENCOUNTER — Other Ambulatory Visit: Payer: Self-pay

## 2019-01-28 VITALS — Ht 74.0 in | Wt 211.0 lb

## 2019-01-28 DIAGNOSIS — I25110 Atherosclerotic heart disease of native coronary artery with unstable angina pectoris: Secondary | ICD-10-CM

## 2019-01-28 DIAGNOSIS — E785 Hyperlipidemia, unspecified: Secondary | ICD-10-CM

## 2019-01-28 DIAGNOSIS — I10 Essential (primary) hypertension: Secondary | ICD-10-CM

## 2019-01-28 DIAGNOSIS — Z720 Tobacco use: Secondary | ICD-10-CM

## 2019-01-28 MED ORDER — CLOPIDOGREL BISULFATE 75 MG PO TABS: ORAL_TABLET | ORAL | 3 refills | Status: DC

## 2019-01-28 NOTE — Patient Instructions (Signed)
Medication Instructions:  Your physician has recommended you make the following change in your medication:  1.  Once you take the last Brilinta at night, you will start Plavix the next morning by taking 4 tablets at one time on day 1 then after that you will only take 1 tablet daily  If you need a refill on your cardiac medications before your next appointment, please call your pharmacy.   Lab work: 03/11/2019:  FASTING LIPIDS & LFT  If you have labs (blood work) drawn today and your tests are completely normal, you will receive your results only by: Marland Kitchen MyChart Message (if you have MyChart) OR . A paper copy in the mail If you have any lab test that is abnormal or we need to change your treatment, we will call you to review the results.  Testing/Procedures: None ordered  Follow-Up: At [facility], you and your health needs are our priority.  As part of our continuing mission to provide you with exceptional heart care, we have created designated Provider Care Teams.  These Care Teams include your primary Cardiologist (physician) and Advanced Practice Providers (APPs -  Physician Assistants and Nurse Practitioners) who all work together to provide you with the care you need, when you need it. You will need a follow up appointment in:  4 months.  Please call our office 2 months in advance to schedule this appointment.  You may see Sherren Mocha, MD or one of the following Advanced Practice Providers on your designated Care Team: Richardson Dopp, PA-C [place], [place] . Daune Perch, NP  Any Other Special Instructions Will Be Listed Below (If Applicable).

## 2019-03-11 ENCOUNTER — Other Ambulatory Visit: Payer: Self-pay

## 2019-03-11 DIAGNOSIS — E785 Hyperlipidemia, unspecified: Secondary | ICD-10-CM

## 2019-03-11 DIAGNOSIS — Z720 Tobacco use: Secondary | ICD-10-CM

## 2019-03-11 DIAGNOSIS — I10 Essential (primary) hypertension: Secondary | ICD-10-CM

## 2019-03-11 DIAGNOSIS — I25110 Atherosclerotic heart disease of native coronary artery with unstable angina pectoris: Secondary | ICD-10-CM

## 2019-03-11 LAB — HEPATIC FUNCTION PANEL
ALT: 35 IU/L (ref 0–44)
AST: 30 IU/L (ref 0–40)
Albumin: 4.3 g/dL (ref 3.8–4.8)
Alkaline Phosphatase: 56 IU/L (ref 39–117)
Bilirubin Total: 0.9 mg/dL (ref 0.0–1.2)
Bilirubin, Direct: 0.24 mg/dL (ref 0.00–0.40)
Total Protein: 6.9 g/dL (ref 6.0–8.5)

## 2019-03-11 LAB — LIPID PANEL
Chol/HDL Ratio: 3 ratio (ref 0.0–5.0)
Cholesterol, Total: 89 mg/dL — ABNORMAL LOW (ref 100–199)
HDL: 30 mg/dL — ABNORMAL LOW (ref >39)
LDL Calculated: 30 mg/dL (ref 0–99)
Triglycerides: 145 mg/dL (ref 0–149)
VLDL Cholesterol Cal: 29 mg/dL (ref 5–40)

## 2019-03-13 ENCOUNTER — Telehealth: Payer: Self-pay | Admitting: *Deleted

## 2019-03-13 MED ORDER — ROSUVASTATIN CALCIUM 40 MG PO TABS: 40.0000 mg | ORAL_TABLET | Freq: Every day | ORAL | 3 refills | Status: DC

## 2019-03-13 NOTE — Telephone Encounter (Signed)
Pt made aware of his lab results / med change. See result note.

## 2019-03-13 NOTE — Telephone Encounter (Signed)
Left pt a message to call back re: lab results.

## 2019-03-13 NOTE — Progress Notes (Signed)
Pt has been made awa 03/13/19 re of normal result and verbalized understanding.  jw

## 2019-03-13 NOTE — Telephone Encounter (Signed)
-----   Message from [place], [place] sent at 03/13/2019  8:38 AM EDT ----- I have reviewed with pharmacist>> There is some case of irritability on Lipitor. We can change to Crestor 40mg  daily.   ----- Message ----- From: Jeanann Lewandowsky, RMA Sent: 03/13/2019   8:23 AM EDT To: Leanor Kail, PA  Pt is wanting to know if Atorvastatin could effect his mood/attitude?  States that he has noticed an increase in "bad attitude, the way he handles things". I advised pt I didn't think it would, but I would ask the provider and let him know.  If it does, can we decrease it, per pt?

## 2019-04-17 ENCOUNTER — Ambulatory Visit (INDEPENDENT_AMBULATORY_CARE_PROVIDER_SITE_OTHER): Payer: BLUE CROSS/BLUE SHIELD | Admitting: Family Medicine

## 2019-04-17 ENCOUNTER — Other Ambulatory Visit: Payer: Self-pay

## 2019-04-17 ENCOUNTER — Encounter: Payer: Self-pay | Admitting: Family Medicine

## 2019-04-17 VITALS — BP 110/70 | HR 84 | Temp 98.0°F | Ht 74.0 in | Wt 206.2 lb

## 2019-04-17 DIAGNOSIS — M549 Dorsalgia, unspecified: Secondary | ICD-10-CM | POA: Diagnosis not present

## 2019-04-17 MED ORDER — PREDNISONE 20 MG PO TABS: ORAL_TABLET | ORAL | 0 refills | Status: DC

## 2019-04-17 MED ORDER — TIZANIDINE HCL 4 MG PO TABS: 2.0000 mg | ORAL_TABLET | Freq: Every evening | ORAL | 0 refills | Status: DC | PRN

## 2019-04-17 NOTE — Progress Notes (Signed)
Lawrence Anspaugh T. Avary Eichenberger, MD Primary Care and [place] at [facility] [place], [address] Phone: [phone]  FAX: [phone]  Lawrence Wallace - 63 y.o. male  MRN [id number]  Date of Birth: Sep 07, 1956  Visit Date: 04/17/2019  PCP: Pleas Koch, NP  Referred by: Pleas Koch, NP  Chief Complaint  Patient presents with  . Back Pain    Hurt while lifting furniture on Sunday   Subjective:   Lawrence Wallace is a 63 y.o. very pleasant male patient with Body mass index is 26.48 kg/m. who presents with the following:  Was moving an armoir Sunday and jerked but it did not move.  Stayed on his knees for a while. Back has been killing him ever since.  Went to a Restaurant manager, fast food.  X-ray, some arthritis.   He is a very nice guy with a limited back history.  He did move a heavy piece of furniture on Sunday, since that time his back is really been hurting a lot.  He is not having any numbness or tingling.  He does take Plavix, so he knows that he cannot take NSAIDs.  He has been taking some Tylenol.  He is also done some other basic stuff such as heat and sitting in a warm shower.  He went to the chiropractor, this did not help at all.  Takes some Plavix.     Past Medical History, Surgical History, Social History, Family History, Problem List, Medications, and Allergies have been reviewed and updated if relevant.  Patient Active Problem List   Diagnosis Date Noted  . Hypertension 01/17/2019  . Hyperlipidemia LDL goal <70 01/17/2019  . CAD in native artery 01/17/2019  . NSTEMI (non-ST elevated myocardial infarction) ([place]) 01/15/2019  . Viral URI with cough 05/29/2018  . Lower extremity pain 03/13/2017  . Chest wall pain 03/01/2016  . GERD (gastroesophageal reflux disease) 05/31/2015  . Preventative health care 05/31/2015  . Tobacco abuse 05/31/2015    Past Medical History:  Diagnosis Date  . Asthma   . CAD in native artery     a. cath 12/2018 - total occlusion of the mid Circumflex artery s/p successful PTCA/DES x 1. Moderate non-obstructive disease in the mid RCA and mid LAD.  Marland Kitchen Cancer ([place])   . GERD (gastroesophageal reflux disease)   . Hyperlipidemia LDL goal <70 01/17/2019  . Hypertension 01/17/2019  . NSTEMI (non-ST elevated myocardial infarction) ([place]) 01/15/2019  . Tobacco abuse     Past Surgical History:  Procedure Laterality Date  . CORONARY STENT INTERVENTION N/A 01/16/2019   Procedure: CORONARY STENT INTERVENTION;  Surgeon: Burnell Blanks, MD;  Location: [facility];  Service: Cardiovascular;  Laterality: N/A;  . FOOT FRACTURE SURGERY    . LEFT HEART CATH AND CORONARY ANGIOGRAPHY N/A 01/16/2019   Procedure: LEFT HEART CATH AND CORONARY ANGIOGRAPHY;  Surgeon: Burnell Blanks, MD;  Location: [facility];  Service: Cardiovascular;  Laterality: N/A;    Social History   Socioeconomic History  . Marital status: Married    Spouse name: Not on file  . Number of children: Not on file  . Years of education: Not on file  . Highest education level: Not on file  Occupational History  . Not on file  Social Needs  . Financial resource strain: Not on file  . Food insecurity    Worry: Not on file    Inability: Not on file  .  Transportation needs    Medical: Not on file    Non-medical: Not on file  Tobacco Use  . Smoking status: Current Every Day Smoker    Packs/day: 1.50    Years: 45.00    Pack years: 67.50    Types: Cigarettes  . Smokeless tobacco: Never Used  Substance and Sexual Activity  . Alcohol use: Yes    Alcohol/week: 1.0 standard drinks    Types: 1 Shots of liquor per week    Comment: 1 mixed liquor every night  . Drug use: Not on file  . Sexual activity: Not on file  Lifestyle  . Physical activity    Days per week: Not on file    Minutes per session: Not on file  . Stress: Not on file  Relationships  . Social Herbalist on phone: Not on file     Gets together: Not on file    Attends religious service: Not on file    Active member of club or organization: Not on file    Attends meetings of clubs or organizations: Not on file    Relationship status: Not on file  . Intimate partner violence    Fear of current or ex partner: Not on file    Emotionally abused: Not on file    Physically abused: Not on file    Forced sexual activity: Not on file  Other Topics Concern  . Not on file  Social History Narrative  . Not on file    Family History  Problem Relation Age of Onset  . Stroke Mother   . Cancer Mother   . Cancer Father     No Known Allergies  Medication list reviewed and updated in full in [place].  GEN: No fevers, chills. Nontoxic. Primarily MSK c/o today. MSK: Detailed in the HPI GI: tolerating PO intake without difficulty Neuro: No numbness, parasthesias, or tingling associated. Otherwise the pertinent positives of the ROS are noted above.   Objective:   BP 110/70   Pulse 84   Temp 98 F (36.7 C) (Temporal)   Ht 6\' 2"  (1.88 m)   Wt 206 lb 4 oz (93.6 kg)   SpO2 94%   BMI 26.48 kg/m    GEN: Well-developed,well-nourished,in no acute distress; alert,appropriate and cooperative throughout examination HEENT: Normocephalic and atraumatic without obvious abnormalities. Ears, externally no deformities PULM: Breathing comfortably in no respiratory distress EXT: No clubbing, cyanosis, or edema PSYCH: Normally interactive. Cooperative during the interview. Pleasant. Friendly and conversant. Not anxious or depressed appearing. Normal, full affect.  Range of motion at  the waist: Flexion, extension, lateral bending and rotation: Forward flexion is only to about 40 degrees.  Extension is preserved, and lateral bending and rotation are all within normal.   No echymosis or edema Rises to examination table with mild difficulty Gait: minimally antalgic  Inspection/Deformity: N Paraspinus Tenderness: He  does have relatively diffuse spinal tenderness more on the right from approximately L3-S1.  B Ankle Dorsiflexion (L5,4): 5/5 B Great Toe Dorsiflexion (L5,4): 5/5 Heel Walk (L5): WNL Toe Walk (S1): WNL Rise/Squat (L4): WNL, mild pain  SENSORY B Medial Foot (L4): WNL B Dorsum (L5): WNL B Lateral (S1): WNL Light Touch: WNL Pinprick: WNL  REFLEXES Knee (L4): 2+ Ankle (S1): 2+  B SLR, seated: neg B SLR, supine: neg B FABER: neg B Reverse FABER: neg B Greater Troch: NT B Log Roll: neg B Stork: NT B Sciatic Notch: NT   Radiology: No  results found.  Assessment and Plan:     ICD-10-CM   1. Acute back pain, unspecified back location, unspecified back pain laterality  M54.9    No red flags.  Standard treatment.  Unable to take NSAIDs, so I am in a give him some prednisone.  Zanaflex as needed.  Basic range of motion only at this point.  Follow-up: No follow-ups on file.  Meds ordered this encounter  Medications  . predniSONE (DELTASONE) 20 MG tablet    Sig: 2 tabs po daily for 5 days, then 1 tab po daily for 5 days    Dispense:  15 tablet    Refill:  0  . tiZANidine (ZANAFLEX) 4 MG tablet    Sig: Take 0.5-1 tablets (2-4 mg total) by mouth at bedtime as needed for muscle spasms.    Dispense:  30 tablet    Refill:  0   No orders of the defined types were placed in this encounter.   Signed,  Maud Deed. Delos Klich, MD   Outpatient Encounter Medications as of 04/17/2019  Medication Sig  . aspirin EC 81 MG EC tablet Take 1 tablet (81 mg total) by mouth daily.  . clopidogrel (PLAVIX) 75 MG tablet TAKE 4 TABLETS BY MOUTH ON DAY 1 , TAKE 1 TABLET BY MOUTH DAILY THEREAFTER  . metoprolol tartrate (LOPRESSOR) 25 MG tablet Take 1 tablet (25 mg total) by mouth 2 (two) times daily.  . Multiple Vitamin (MULTIVITAMIN) tablet Take 1 tablet by mouth daily.  . nitroGLYCERIN (NITROSTAT) 0.4 MG SL tablet Place 1 tablet (0.4 mg total) under the tongue every 5 (five) minutes x 3 doses as  needed for chest pain.  . rosuvastatin (CRESTOR) 40 MG tablet Take 1 tablet (40 mg total) by mouth daily.  . predniSONE (DELTASONE) 20 MG tablet 2 tabs po daily for 5 days, then 1 tab po daily for 5 days  . tiZANidine (ZANAFLEX) 4 MG tablet Take 0.5-1 tablets (2-4 mg total) by mouth at bedtime as needed for muscle spasms.   No facility-administered encounter medications on file as of 04/17/2019.

## 2019-05-05 ENCOUNTER — Other Ambulatory Visit: Payer: Self-pay

## 2019-05-05 DIAGNOSIS — Z20822 Contact with and (suspected) exposure to covid-19: Secondary | ICD-10-CM

## 2019-05-07 ENCOUNTER — Telehealth: Payer: Self-pay

## 2019-05-07 ENCOUNTER — Telehealth: Payer: Self-pay | Admitting: Cardiovascular Disease

## 2019-05-07 LAB — SPECIMEN STATUS REPORT

## 2019-05-07 LAB — NOVEL CORONAVIRUS, NAA: SARS-CoV-2, NAA: NOT DETECTED

## 2019-05-07 NOTE — Telephone Encounter (Signed)
Noted.  Will evaluate. 

## 2019-05-07 NOTE — Telephone Encounter (Signed)
  Patient had a stent put in on 01/16/19 and he is c/o coughing up blood. He coughed up blood on 05/04/19 and then again last night. He said each episode lasted about 10-15 min. Please advise.

## 2019-05-07 NOTE — Telephone Encounter (Signed)
The patient had a stent placed in April 2020 and was started on ASA and Plavix.  8/16, he coughed up blood. He states it was a mouthful, not just pink and frothy like he brushed his teeth too hard. Then he coughed up more blood last night. He reports once he spits the blood out, his mouth is bloody for about 15 minutes and it goes away.  He is not bleeding now. Instructed the patient to call PCP ASAP for evaluation. He will have PCP call Cardiology if assistance is needed.

## 2019-05-07 NOTE — Telephone Encounter (Signed)
Pt said either on 05/03/19 or 05/04/19 started coughing up blood. Then stopped; started again last night and has coughed up blood x 1 this morning. Pt said he does not have coughing or coughing episodes; the blood comes up in throat and then coughs to spit it out. Pt said is bright red and about a teaspoon each time. Pt has no diet change and has not been eating greasy or spicy foods. No N&V. Pt said 3-4 wks ago tried to move heavy piece of furniture and had back pain; pt went to chiropractor and after going to chiropractor had abd pain just below belly button; the dull abd pain has been constant since seeing the chiropractor; no urinary symptoms and no constipation or diarrhea. Does not see or feel any bulge or knot in abd. Pain level now is 2. Pt has no covid symptoms, no travel and no known exposure to + covid. Pt got report today that covid testing was negative. Pt does not want to go to UC and offered appt today at [facility] but pt is having movers come and move armoire this afternoon and wanted appt in AM with K ClarkNP. Pt scheduled in office appt 05/08/19 at 8 AM. ED precautions given and pt has already taken Plavix and ASA today; advised pt not to take Plavix and ASA in the morning until after sees Gentry Fitz NP. Pt voiced understanding.

## 2019-05-08 ENCOUNTER — Other Ambulatory Visit: Payer: Self-pay | Admitting: Primary Care

## 2019-05-08 ENCOUNTER — Other Ambulatory Visit: Payer: Self-pay

## 2019-05-08 ENCOUNTER — Encounter: Payer: Self-pay | Admitting: Primary Care

## 2019-05-08 ENCOUNTER — Ambulatory Visit
Admission: RE | Admit: 2019-05-08 | Discharge: 2019-05-08 | Disposition: A | Discharge status: Home / Self Care | Payer: Medicare Other | Source: Ambulatory Visit | Attending: Primary Care | Admitting: Primary Care

## 2019-05-08 ENCOUNTER — Ambulatory Visit (INDEPENDENT_AMBULATORY_CARE_PROVIDER_SITE_OTHER): Payer: Medicare Other | Admitting: Primary Care

## 2019-05-08 VITALS — BP 112/72 | HR 59 | Temp 97.6°F | Ht 74.0 in | Wt 205.5 lb

## 2019-05-08 DIAGNOSIS — R042 Hemoptysis: Secondary | ICD-10-CM | POA: Insufficient documentation

## 2019-05-08 DIAGNOSIS — I7143 Infrarenal abdominal aortic aneurysm, without rupture: Secondary | ICD-10-CM

## 2019-05-08 DIAGNOSIS — Z720 Tobacco use: Secondary | ICD-10-CM | POA: Diagnosis not present

## 2019-05-08 DIAGNOSIS — I714 Abdominal aortic aneurysm, without rupture: Secondary | ICD-10-CM

## 2019-05-08 LAB — COMPREHENSIVE METABOLIC PANEL
ALT: 24 U/L (ref 0–53)
AST: 20 U/L (ref 0–37)
Albumin: 4.1 g/dL (ref 3.5–5.2)
Alkaline Phosphatase: 84 U/L (ref 39–117)
BUN: 10 mg/dL (ref 6–23)
CO2: 26 mEq/L (ref 19–32)
Calcium: 9.3 mg/dL (ref 8.4–10.5)
Chloride: 103 mEq/L (ref 96–112)
Creatinine, Ser: 0.8 mg/dL (ref 0.40–1.50)
GFR: 97.01 mL/min (ref >60.00)
Glucose, Bld: 136 mg/dL — ABNORMAL HIGH (ref 70–99)
Potassium: 3.9 mEq/L (ref 3.5–5.1)
Sodium: 138 mEq/L (ref 135–145)
Total Bilirubin: 0.8 mg/dL (ref 0.2–1.2)
Total Protein: 7.1 g/dL (ref 6.0–8.3)

## 2019-05-08 LAB — CBC
HCT: 48.9 % (ref 39.0–52.0)
Hemoglobin: 15.8 g/dL (ref 13.0–17.0)
MCHC: 32.4 g/dL (ref 30.0–36.0)
MCV: 94.8 fl (ref 78.0–100.0)
Platelets: 177 10*3/uL (ref 150.0–400.0)
RBC: 5.16 Mil/uL (ref 4.22–5.81)
RDW: 14.1 % (ref 11.5–15.5)
WBC: 7.2 10*3/uL (ref 4.0–10.5)

## 2019-05-08 LAB — POCT I-STAT CREATININE: Creatinine, Ser: 0.8 mg/dL (ref 0.61–1.24)

## 2019-05-08 MED ORDER — IOHEXOL 300 MG/ML  SOLN
75.0000 mL | Freq: Once | INTRAMUSCULAR | Status: AC | PRN
  Administered 2019-05-08: 75 mL via INTRAVENOUS

## 2019-05-08 MED ORDER — CHANTIX STARTING MONTH PAK 0.5 MG X 11 & 1 MG X 42 PO TABS: ORAL_TABLET | ORAL | 0 refills | Status: AC

## 2019-05-08 NOTE — Assessment & Plan Note (Signed)
Acute beginning late last week, intermittent since. Exam today with dry cough, overall benign.  Differential diagnoses include malignancy (lung, esophageal, throat), esophageal irritation, GERD, upper GI bleed.  Chest xray from April 2020 without clear detail so we will obtain a stat CT chest. Also check labs including CBC and CMP.  Continue to hold clopidogrel and aspirin for now. Consider endoscopy if CT chest without clear reason for symptoms. He appears stable for outpatient treatment.

## 2019-05-08 NOTE — Patient Instructions (Addendum)
Stop by the lab prior to leaving today. I will notify you of your results once received.   Stop by the front desk and speak with [facility] regarding your CT scan.  Start Chantix, follow the package instructions. You must choose a quit date before you start Chantix and you must quit within the first week.  It was a pleasure to see you today!

## 2019-05-08 NOTE — Assessment & Plan Note (Signed)
Ready to quit, commended him on this. Has tried OTC medications with side effects.  Rx for Chantix starting pack sent to pharmacy, discussed instructions for administration.  He will update. Will send in Continuing pack once he's called back with an update.

## 2019-05-08 NOTE — Progress Notes (Signed)
Subjective:    Patient ID: Lawrence Wallace, male    DOB: 1955/11/27, 63 y.o.   MRN: [id number]  HPI  Lawrence Wallace is a 63 year old male with a history of CAD with NSTEMI managed on clopidogrel and aspirin, hypertension, GERD, tobacco abuse who presents today with a chief complaint of hemoptysis.   He phoned into our office yesterday with reports of "coughing up blood" occurred once late last week and then again once two days ago. He was directed to our office for further evaluation.  Today he endorses that he coughed up bright red blood intermittently for 10 minutes late last week, then re-occurred two days ago with same bright red blood which lasted for 10 minutes. This morning he's noticed coughing up a little of bright red blood.   He denies unexplained weight loss, has been trying to lose weight since his heart attack in the Spring 2020. Denies eating greasy/spicy/fried food. He also denies esophageal burning, abdominal pain, fevers, night sweats, shortness of breath. He was once managed on Nexium for years for GERD, has been off for the last several years as his reflux "disappeared".   He's been smoking for the last 50 years, ready to quit smoking and would like to try Chantix. He's never tried Chantix before, has tried the nicotine patch which caused skin irritation and nicotine gum which makes him nauseated.   He underwent chest xray in late April 2020 which showed COPD with "prominent interstitial markings likely due to chronic lung disease". He has a family history of cervical cancer in his mother and "unknown" cancer in his father.   He has not taken his aspirin or clopidogrel since yesterday.  BP Readings from Last 3 Encounters:  05/08/19 112/72  04/17/19 110/70  01/17/19 (!) 157/85   Wt Readings from Last 3 Encounters:  05/08/19 205 lb 8 oz (93.2 kg)  04/17/19 206 lb 4 oz (93.6 kg)  01/28/19 211 lb (95.7 kg)     Review of Systems  Constitutional: Negative for chills, fatigue,  fever and unexpected weight change.  HENT: Negative for trouble swallowing.        Chronic congestion  Respiratory: Positive for cough. Negative for shortness of breath.   Cardiovascular: Negative for chest pain.  Gastrointestinal: Negative for abdominal pain and nausea.       Denies GERD symptoms       Past Medical History:  Diagnosis Date  . Asthma   . CAD in native artery    a. cath 12/2018 - total occlusion of the mid Circumflex artery s/p successful PTCA/DES x 1. Moderate non-obstructive disease in the mid RCA and mid LAD.  Marland Kitchen Cancer ([place])   . GERD (gastroesophageal reflux disease)   . Hyperlipidemia LDL goal <70 01/17/2019  . Hypertension 01/17/2019  . NSTEMI (non-ST elevated myocardial infarction) ([place]) 01/15/2019  . Tobacco abuse      Social History   Socioeconomic History  . Marital status: Married    Spouse name: Not on file  . Number of children: Not on file  . Years of education: Not on file  . Highest education level: Not on file  Occupational History  . Not on file  Social Needs  . Financial resource strain: Not on file  . Food insecurity    Worry: Not on file    Inability: Not on file  . Transportation needs    Medical: Not on file    Non-medical: Not on file  Tobacco Use  .  Smoking status: Current Every Day Smoker    Packs/day: 1.50    Years: 45.00    Pack years: 67.50    Types: Cigarettes  . Smokeless tobacco: Never Used  Substance and Sexual Activity  . Alcohol use: Yes    Alcohol/week: 1.0 standard drinks    Types: 1 Shots of liquor per week    Comment: 1 mixed liquor every night  . Drug use: Not on file  . Sexual activity: Not on file  Lifestyle  . Physical activity    Days per week: Not on file    Minutes per session: Not on file  . Stress: Not on file  Relationships  . Social Herbalist on phone: Not on file    Gets together: Not on file    Attends religious service: Not on file    Active member of club or organization: Not on  file    Attends meetings of clubs or organizations: Not on file    Relationship status: Not on file  . Intimate partner violence    Fear of current or ex partner: Not on file    Emotionally abused: Not on file    Physically abused: Not on file    Forced sexual activity: Not on file  Other Topics Concern  . Not on file  Social History Narrative  . Not on file    Past Surgical History:  Procedure Laterality Date  . CORONARY STENT INTERVENTION N/A 01/16/2019   Procedure: CORONARY STENT INTERVENTION;  Surgeon: Burnell Blanks, MD;  Location: [facility];  Service: Cardiovascular;  Laterality: N/A;  . FOOT FRACTURE SURGERY    . LEFT HEART CATH AND CORONARY ANGIOGRAPHY N/A 01/16/2019   Procedure: LEFT HEART CATH AND CORONARY ANGIOGRAPHY;  Surgeon: Burnell Blanks, MD;  Location: [facility];  Service: Cardiovascular;  Laterality: N/A;    Family History  Problem Relation Age of Onset  . Stroke Mother   . Cancer Mother   . Cancer Father     No Known Allergies  Current Outpatient Medications on File Prior to Visit  Medication Sig Dispense Refill  . aspirin EC 81 MG EC tablet Take 1 tablet (81 mg total) by mouth daily. 90 tablet 3  . clopidogrel (PLAVIX) 75 MG tablet TAKE 4 TABLETS BY MOUTH ON DAY 1 , TAKE 1 TABLET BY MOUTH DAILY THEREAFTER 94 tablet 3  . metoprolol tartrate (LOPRESSOR) 25 MG tablet Take 1 tablet (25 mg total) by mouth 2 (two) times daily. 60 tablet 6  . Multiple Vitamin (MULTIVITAMIN) tablet Take 1 tablet by mouth daily.    . nitroGLYCERIN (NITROSTAT) 0.4 MG SL tablet Place 1 tablet (0.4 mg total) under the tongue every 5 (five) minutes x 3 doses as needed for chest pain. 25 tablet 12  . rosuvastatin (CRESTOR) 40 MG tablet Take 1 tablet (40 mg total) by mouth daily. 90 tablet 3   No current facility-administered medications on file prior to visit.     BP 112/72   Pulse (!) 59   Temp 97.6 F (36.4 C) (Temporal)   Ht 6\' 2"  (1.88 m)    Wt 205 lb 8 oz (93.2 kg)   SpO2 98%   BMI 26.38 kg/m    Objective:   Physical Exam  Constitutional: He appears well-nourished. He does not have a sickly appearance. He does not appear ill.  HENT:  Mouth/Throat: Oropharynx is clear and moist.  Neck: Neck supple.  Cardiovascular: Normal  rate and regular rhythm.  Respiratory: Effort normal. No respiratory distress.  Mild rhonchi to left upper field  GI: Soft.  Skin: Skin is warm and dry.           Assessment & Plan:

## 2019-05-08 NOTE — Progress Notes (Signed)
Agree. Would try him back on plavix alone. Since he is 3+ months out from PCI, probably best to stay off ASA to reduce bleeding risk. thx

## 2019-05-09 ENCOUNTER — Telehealth: Payer: Self-pay | Admitting: Primary Care

## 2019-05-09 NOTE — Telephone Encounter (Signed)
Patient called and stated he had a MRI done yesterday morning and the tech told him he should hear from our office within an hour for results. Patient would like to know if these results have come back since he did not hear from our office ,   C/B # [phone]

## 2019-05-09 NOTE — Telephone Encounter (Signed)
This has been addressed by Allie Bossier

## 2019-05-13 ENCOUNTER — Other Ambulatory Visit: Payer: Self-pay

## 2019-05-13 ENCOUNTER — Encounter: Payer: Self-pay | Admitting: Vascular Surgery

## 2019-05-13 ENCOUNTER — Ambulatory Visit (INDEPENDENT_AMBULATORY_CARE_PROVIDER_SITE_OTHER): Payer: Medicare Other | Admitting: Vascular Surgery

## 2019-05-13 DIAGNOSIS — I714 Abdominal aortic aneurysm, without rupture, unspecified: Secondary | ICD-10-CM

## 2019-05-13 NOTE — Progress Notes (Signed)
Patient name: Lawrence Wallace MRN: [id number] DOB: Aug 29, 1956 Sex: male  REASON FOR CONSULT: 5.4 cm abdominal aortic aneurysm  HPI: Lawrence Wallace is a 63 y.o. male, with history of coronary artery disease s/p NSTEMI with dug-eluting stent in 12/2018 in the circumflex, hypertension, hyperlipidemia that presents for evaluation of abdominal aortic aneurysm.  Patient states he was having hemoptysis over the last week or two and underwent a CT chest by his PCP.  Ultimately on this scan got partial imaging of a 5.4 cm abdominal aortic aneurysm.  Patient reports no prior knowledge of the aneurysm.  No abdominal pain or back pain at this time.  He does smoke about a pack a day.  He is on Plavix following his NSTEMI with coronary stent placed in April of this year by Dr. Burt Knack.  As far as belly surgery goes he states he did have a cyst removed from his upper abdomen in the past.  No CP or SOB at this time.  Past Medical History:  Diagnosis Date  . Asthma   . CAD in native artery    a. cath 12/2018 - total occlusion of the mid Circumflex artery s/p successful PTCA/DES x 1. Moderate non-obstructive disease in the mid RCA and mid LAD.  Marland Kitchen Cancer (HCC)    Skin  . GERD (gastroesophageal reflux disease)   . Hyperlipidemia LDL goal <70 01/17/2019  . Hypertension 01/17/2019  . NSTEMI (non-ST elevated myocardial infarction) ([place]) 01/15/2019  . Tobacco abuse     Past Surgical History:  Procedure Laterality Date  . CORONARY STENT INTERVENTION N/A 01/16/2019   Procedure: CORONARY STENT INTERVENTION;  Surgeon: Burnell Blanks, MD;  Location: [facility];  Service: Cardiovascular;  Laterality: N/A;  . FOOT FRACTURE SURGERY    . LEFT HEART CATH AND CORONARY ANGIOGRAPHY N/A 01/16/2019   Procedure: LEFT HEART CATH AND CORONARY ANGIOGRAPHY;  Surgeon: Burnell Blanks, MD;  Location: [facility];  Service: Cardiovascular;  Laterality: N/A;    Family History  Problem Relation Age of Onset  .  Stroke Mother   . Cancer Mother   . Cancer Father     SOCIAL HISTORY: Social History   Socioeconomic History  . Marital status: Married    Spouse name: Not on file  . Number of children: Not on file  . Years of education: Not on file  . Highest education level: Not on file  Occupational History  . Not on file  Social Needs  . Financial resource strain: Not on file  . Food insecurity    Worry: Not on file    Inability: Not on file  . Transportation needs    Medical: Not on file    Non-medical: Not on file  Tobacco Use  . Smoking status: Current Every Day Smoker    Packs/day: 1.50    Years: 45.00    Pack years: 67.50    Types: Cigarettes  . Smokeless tobacco: Never Used  Substance and Sexual Activity  . Alcohol use: Yes    Alcohol/week: 1.0 standard drinks    Types: 1 Shots of liquor per week    Comment: 1 mixed liquor every night  . Drug use: Not on file  . Sexual activity: Not on file  Lifestyle  . Physical activity    Days per week: Not on file    Minutes per session: Not on file  . Stress: Not on file  Relationships  . Social connections  Talks on phone: Not on file    Gets together: Not on file    Attends religious service: Not on file    Active member of club or organization: Not on file    Attends meetings of clubs or organizations: Not on file    Relationship status: Not on file  . Intimate partner violence    Fear of current or ex partner: Not on file    Emotionally abused: Not on file    Physically abused: Not on file    Forced sexual activity: Not on file  Other Topics Concern  . Not on file  Social History Narrative  . Not on file    No Known Allergies  Current Outpatient Medications  Medication Sig Dispense Refill  . clopidogrel (PLAVIX) 75 MG tablet TAKE 4 TABLETS BY MOUTH ON DAY 1 , TAKE 1 TABLET BY MOUTH DAILY THEREAFTER 94 tablet 3  . metoprolol tartrate (LOPRESSOR) 25 MG tablet Take 1 tablet (25 mg total) by mouth 2 (two) times  daily. 60 tablet 6  . Multiple Vitamin (MULTIVITAMIN) tablet Take 1 tablet by mouth daily.    . rosuvastatin (CRESTOR) 40 MG tablet Take 1 tablet (40 mg total) by mouth daily. 90 tablet 3  . aspirin EC 81 MG EC tablet Take 1 tablet (81 mg total) by mouth daily. (Patient not taking: Reported on 05/13/2019) 90 tablet 3  . nitroGLYCERIN (NITROSTAT) 0.4 MG SL tablet Place 1 tablet (0.4 mg total) under the tongue every 5 (five) minutes x 3 doses as needed for chest pain. (Patient not taking: Reported on 05/13/2019) 25 tablet 12  . varenicline (CHANTIX STARTING MONTH PAK) 0.5 MG X 11 & 1 MG X 42 tablet Take one 0.5 mg tablet by mouth once daily for 3 days, then increase to one 0.5 mg tablet twice daily for 4 days, then increase to one 1 mg tablet twice daily. (Patient not taking: Reported on 05/13/2019) 53 tablet 0   No current facility-administered medications for this visit.     REVIEW OF SYSTEMS:  [X]  denotes positive finding, [ ]  denotes negative finding Cardiac  Comments:  Chest pain or chest pressure:    Shortness of breath upon exertion:    Short of breath when lying flat:    Irregular heart rhythm:        Vascular    Pain in calf, thigh, or hip brought on by ambulation:    Pain in feet at night that wakes you up from your sleep:     Blood clot in your veins:    Leg swelling:         Pulmonary    Oxygen at home:    Productive cough:     Wheezing:         Neurologic    Sudden weakness in arms or legs:     Sudden numbness in arms or legs:     Sudden onset of difficulty speaking or slurred speech:    Temporary loss of vision in one eye:     Problems with dizziness:         Gastrointestinal    Blood in stool:     Vomited blood:         Genitourinary    Burning when urinating:     Blood in urine:        Psychiatric    Major depression:         Hematologic    Bleeding problems:    Problems  with blood clotting too easily:        Skin    Rashes or ulcers:         Constitutional    Fever or chills:      PHYSICAL EXAM: Vitals:   05/13/19 1507  BP: 117/74  Pulse: 80  Resp: 16  Temp: (!) 96.9 F (36.1 C)  TempSrc: Temporal  SpO2: 96%  Weight: 206 lb (93.4 kg)  Height: 6\' 2"  (1.88 m)    GENERAL: The patient is a well-nourished male, in no acute distress. The vital signs are documented above. CARDIAC: There is a regular rate and rhythm.  VASCULAR:  2+ palpable femoral pulses bilaterally 2+ palpable DP/PT pulses bilaterally PULMONARY: There is good air exchange bilaterally without wheezing or rales. ABDOMEN: Soft and non-tender.  No rebound or guarding. MUSCULOSKELETAL: There are no major deformities or cyanosis. NEUROLOGIC: No focal weakness or paresthesias are detected. SKIN: There are no ulcers or rashes noted. PSYCHIATRIC: The patient has a normal affect.  DATA:   I reviewed the CT chest and reviewed the partially imaged abdominal aortic aneurysm but limited given aneurysm incompletely visualized.  Does appear to have shorter angulated neck.  Will get more accurate measurements with complete imaging.  Assessment/Plan:  63 year old male who presents with likely 5.4 cm abdominal aortic aneurysm.  I recommended CTA abdomen pelvis for further evaluation and to see what his options are for repair.  It does look like he has short neck that is fairly angulated but I need to get complete imaging to see if he would be a candidate for a stent graft with Evar vs open repair.  I will have him follow with me in clinic once his imaging is complete to further discuss next steps.     [name], MD [facility] Office: [phone] Pager: [phone]

## 2019-05-14 ENCOUNTER — Other Ambulatory Visit: Payer: Self-pay | Admitting: Family Medicine

## 2019-05-15 ENCOUNTER — Other Ambulatory Visit: Payer: Self-pay | Admitting: Vascular Surgery

## 2019-05-15 DIAGNOSIS — I714 Abdominal aortic aneurysm, without rupture, unspecified: Secondary | ICD-10-CM

## 2019-05-22 ENCOUNTER — Other Ambulatory Visit: Payer: Self-pay | Admitting: Cardiovascular Disease

## 2019-05-22 MED ORDER — CLOPIDOGREL BISULFATE 75 MG PO TABS: 75.0000 mg | ORAL_TABLET | Freq: Every day | ORAL | 2 refills | Status: DC

## 2019-05-22 MED ORDER — ROSUVASTATIN CALCIUM 40 MG PO TABS: 40.0000 mg | ORAL_TABLET | Freq: Every day | ORAL | 2 refills | Status: AC

## 2019-05-22 MED ORDER — NITROGLYCERIN 0.4 MG SL SUBL: 0.4000 mg | SUBLINGUAL_TABLET | SUBLINGUAL | 1 refills | Status: AC | PRN

## 2019-05-22 MED ORDER — METOPROLOL TARTRATE 25 MG PO TABS: 25.0000 mg | ORAL_TABLET | Freq: Two times a day (BID) | ORAL | 2 refills | Status: AC

## 2019-05-30 ENCOUNTER — Other Ambulatory Visit: Payer: Self-pay | Admitting: *Deleted

## 2019-05-30 MED ORDER — CLOPIDOGREL BISULFATE 75 MG PO TABS: 75.0000 mg | ORAL_TABLET | Freq: Every day | ORAL | 3 refills | Status: AC

## 2019-06-05 ENCOUNTER — Ambulatory Visit
Admission: RE | Admit: 2019-06-05 | Discharge: 2019-06-05 | Disposition: A | Discharge status: Home / Self Care | Payer: Medicare Other | Source: Ambulatory Visit | Attending: Vascular Surgery | Admitting: Vascular Surgery

## 2019-06-05 ENCOUNTER — Other Ambulatory Visit: Payer: Self-pay

## 2019-06-05 DIAGNOSIS — I714 Abdominal aortic aneurysm, without rupture, unspecified: Secondary | ICD-10-CM

## 2019-06-05 MED ORDER — IOPAMIDOL (ISOVUE-370) INJECTION 76%
75.0000 mL | Freq: Once | INTRAVENOUS | Status: AC | PRN
  Administered 2019-06-05: 75 mL via INTRAVENOUS

## 2019-06-10 ENCOUNTER — Encounter: Payer: Self-pay | Admitting: Vascular Surgery

## 2019-06-10 ENCOUNTER — Other Ambulatory Visit: Payer: Self-pay | Admitting: *Deleted

## 2019-06-10 ENCOUNTER — Other Ambulatory Visit: Payer: Self-pay

## 2019-06-10 ENCOUNTER — Ambulatory Visit: Payer: Medicare Other | Admitting: Vascular Surgery

## 2019-06-10 ENCOUNTER — Encounter: Payer: Self-pay | Admitting: *Deleted

## 2019-06-10 VITALS — BP 123/83 | HR 76 | Temp 96.6°F | Resp 18 | Ht 74.0 in | Wt 206.0 lb

## 2019-06-10 DIAGNOSIS — I714 Abdominal aortic aneurysm, without rupture, unspecified: Secondary | ICD-10-CM

## 2019-06-10 NOTE — Progress Notes (Signed)
Patient name: Lawrence Wallace MRN: [id number] DOB: 1955/10/29 Sex: male  REASON FOR CONSULT: F/U for AAA after CTA for further evaluation  HPI: Lawrence Wallace is a 63 y.o. male, with history of coronary artery disease s/p NSTEMI with dug-eluting stent in 12/2018 in the circumflex, hypertension, hyperlipidemia that presents for follow-up of abdominal aortic aneurysm after CTA.    As previously noted, he was having hemoptysis earlier this year and underwent a CT chest by his PCP.  Ultimately on this scan got partial imaging of a 5.4 cm abdominal aortic aneurysm.  Patient reports no prior knowledge of the aneurysm.    He is on Plavix following his NSTEMI with coronary stent placed in April of this year by Dr. Burt Knack.  As far as belly surgery goes he states he did have a cyst removed from his upper abdomen in the past.  On follow-up today after CT scan no additional abdominal or back pain.    Past Medical History:  Diagnosis Date  . Asthma   . CAD in native artery    a. cath 12/2018 - total occlusion of the mid Circumflex artery s/p successful PTCA/DES x 1. Moderate non-obstructive disease in the mid RCA and mid LAD.  Marland Kitchen Cancer (HCC)    Skin  . GERD (gastroesophageal reflux disease)   . Hyperlipidemia LDL goal <70 01/17/2019  . Hypertension 01/17/2019  . NSTEMI (non-ST elevated myocardial infarction) ([place]) 01/15/2019  . Tobacco abuse     Past Surgical History:  Procedure Laterality Date  . CORONARY STENT INTERVENTION N/A 01/16/2019   Procedure: CORONARY STENT INTERVENTION;  Surgeon: Burnell Blanks, MD;  Location: [facility];  Service: Cardiovascular;  Laterality: N/A;  . FOOT FRACTURE SURGERY    . LEFT HEART CATH AND CORONARY ANGIOGRAPHY N/A 01/16/2019   Procedure: LEFT HEART CATH AND CORONARY ANGIOGRAPHY;  Surgeon: Burnell Blanks, MD;  Location: [facility];  Service: Cardiovascular;  Laterality: N/A;    Family History  Problem Relation Age of Onset  . Stroke  Mother   . Cancer Mother   . Cancer Father     SOCIAL HISTORY: Social History   Socioeconomic History  . Marital status: Married    Spouse name: Not on file  . Number of children: Not on file  . Years of education: Not on file  . Highest education level: Not on file  Occupational History  . Not on file  Social Needs  . Financial resource strain: Not on file  . Food insecurity    Worry: Not on file    Inability: Not on file  . Transportation needs    Medical: Not on file    Non-medical: Not on file  Tobacco Use  . Smoking status: Current Every Day Smoker    Packs/day: 1.50    Years: 45.00    Pack years: 67.50    Types: Cigarettes  . Smokeless tobacco: Never Used  Substance and Sexual Activity  . Alcohol use: Yes    Alcohol/week: 1.0 standard drinks    Types: 1 Shots of liquor per week    Comment: 1 mixed liquor every night  . Drug use: Not on file  . Sexual activity: Not on file  Lifestyle  . Physical activity    Days per week: Not on file    Minutes per session: Not on file  . Stress: Not on file  Relationships  . Social connections    Talks on phone: Not on  file    Gets together: Not on file    Attends religious service: Not on file    Active member of club or organization: Not on file    Attends meetings of clubs or organizations: Not on file    Relationship status: Not on file  . Intimate partner violence    Fear of current or ex partner: Not on file    Emotionally abused: Not on file    Physically abused: Not on file    Forced sexual activity: Not on file  Other Topics Concern  . Not on file  Social History Narrative  . Not on file    No Known Allergies  Current Outpatient Medications  Medication Sig Dispense Refill  . clopidogrel (PLAVIX) 75 MG tablet Take 1 tablet (75 mg total) by mouth daily. 90 tablet 3  . metoprolol tartrate (LOPRESSOR) 25 MG tablet Take 1 tablet (25 mg total) by mouth 2 (two) times daily. 180 tablet 2  . Multiple Vitamin  (MULTIVITAMIN) tablet Take 1 tablet by mouth daily.    . nitroGLYCERIN (NITROSTAT) 0.4 MG SL tablet Place 1 tablet (0.4 mg total) under the tongue every 5 (five) minutes x 3 doses as needed for chest pain. 75 tablet 1  . rosuvastatin (CRESTOR) 40 MG tablet Take 1 tablet (40 mg total) by mouth daily. 90 tablet 2  . varenicline (CHANTIX STARTING MONTH PAK) 0.5 MG X 11 & 1 MG X 42 tablet Take one 0.5 mg tablet by mouth once daily for 3 days, then increase to one 0.5 mg tablet twice daily for 4 days, then increase to one 1 mg tablet twice daily. 53 tablet 0  . aspirin EC 81 MG EC tablet Take 1 tablet (81 mg total) by mouth daily. (Patient not taking: Reported on 06/10/2019) 90 tablet 3   No current facility-administered medications for this visit.     REVIEW OF SYSTEMS:  [X]  denotes positive finding, [ ]  denotes negative finding Cardiac  Comments:  Chest pain or chest pressure:    Shortness of breath upon exertion:    Short of breath when lying flat:    Irregular heart rhythm:        Vascular    Pain in calf, thigh, or hip brought on by ambulation:    Pain in feet at night that wakes you up from your sleep:     Blood clot in your veins:    Leg swelling:         Pulmonary    Oxygen at home:    Productive cough:     Wheezing:         Neurologic    Sudden weakness in arms or legs:     Sudden numbness in arms or legs:     Sudden onset of difficulty speaking or slurred speech:    Temporary loss of vision in one eye:     Problems with dizziness:         Gastrointestinal    Blood in stool:     Vomited blood:         Genitourinary    Burning when urinating:     Blood in urine:        Psychiatric    Major depression:         Hematologic    Bleeding problems:    Problems with blood clotting too easily:        Skin    Rashes or ulcers:  Constitutional    Fever or chills:      PHYSICAL EXAM: Vitals:   06/10/19 1104  BP: 123/83  Pulse: 76  Resp: 18  Temp: (!) 96.6  F (35.9 C)  TempSrc: Temporal  SpO2: 97%  Weight: 206 lb (93.4 kg)  Height: 6\' 2"  (1.88 m)    GENERAL: The patient is a well-nourished male, in no acute distress. The vital signs are documented above. CARDIAC: There is a regular rate and rhythm.  VASCULAR:  2+ palpable femoral pulses bilaterally 2+ palpable DP/PT pulses bilaterally PULMONARY: There is good air exchange bilaterally without wheezing or rales. ABDOMEN: Soft and non-tender.  No rebound or guarding. MUSCULOSKELETAL: There are no major deformities or cyanosis. NEUROLOGIC: No focal weakness or paresthesias are detected. SKIN: There are no ulcers or rashes noted. PSYCHIATRIC: The patient has a normal affect.  DATA:   I independently reviewed the new CTA abdomen pelvis and patient has approximate 7 cm abdominal aortic aneurysm with a 5 cm right common iliac aneurysm.  Assessment/Plan:  63 year old male who presents for further follow-up after CTA to completely evaluate suspected 5.4 cm AAA.  Discussed after review of complete imaging he has a 7 cm abdominal aortic aneurysm and a 5 cm right common iliac aneurysm.  We have much more complete and detailed imaging of his aneurysm after the CTA.  Discussed in detail that he is not a candidate for endovascular repair given the short neck and angulation of his aneurysm.  He is going to require open repair and discussed using a bifurcated graft sewn to both iliacs.  I discussed in detail greater than 30% risk of major complication including bleeding requiring return to the OR, heart attack, risk of anesthesia, renal failure, graft infection, even death.  He is at exceedingly high risk for rupture without intervention given the size of both his abdominal aortic aneurysm and his right common iliac aneurysm.  We have scheduled him for surgery next Wednesday pending clearance from cardiology.  Of note he is on Plavix for his drug-eluting coronary stent that was placed in April of this year.   I discussed in detail that I will have him see his cardiologist in order to make sure we can safely take him off the Plavix for open aortic surgery.  His last EF on echocardiogram from 01/16/2019 was 60-65% with normal systolic function.  He will see his cardiologist tomorrow.  [name], MD [facility] Office: [phone] Pager: [place]

## 2019-06-11 ENCOUNTER — Ambulatory Visit (INDEPENDENT_AMBULATORY_CARE_PROVIDER_SITE_OTHER): Payer: Medicare Other | Admitting: Physician Assistant

## 2019-06-11 ENCOUNTER — Encounter: Payer: Self-pay | Admitting: Physician Assistant

## 2019-06-11 VITALS — BP 112/68 | HR 75 | Ht 74.0 in | Wt 206.0 lb

## 2019-06-11 DIAGNOSIS — Z01810 Encounter for preprocedural cardiovascular examination: Secondary | ICD-10-CM | POA: Diagnosis not present

## 2019-06-11 DIAGNOSIS — I714 Abdominal aortic aneurysm, without rupture, unspecified: Secondary | ICD-10-CM

## 2019-06-11 DIAGNOSIS — I10 Essential (primary) hypertension: Secondary | ICD-10-CM

## 2019-06-11 DIAGNOSIS — I2510 Atherosclerotic heart disease of native coronary artery without angina pectoris: Secondary | ICD-10-CM

## 2019-06-11 DIAGNOSIS — E785 Hyperlipidemia, unspecified: Secondary | ICD-10-CM

## 2019-06-11 DIAGNOSIS — Z720 Tobacco use: Secondary | ICD-10-CM

## 2019-06-11 MED ORDER — ASPIRIN EC 81 MG PO TBEC: 81.0000 mg | DELAYED_RELEASE_TABLET | Freq: Every day | ORAL | 3 refills | Status: AC

## 2019-06-11 NOTE — Progress Notes (Signed)
Cardiology Office Note:    Date:  06/11/2019   ID:  Lawrence Wallace, DOB 1956-08-08, MRN [id number]  PCP:  Pleas Koch, NP  Cardiologist:  Sherren Mocha, MD  Electrophysiologist:  None   Referring MD: Pleas Koch, NP   Chief Complaint  Patient presents with  . Follow-up    CAD, surgical clearance    History of Present Illness:    Lawrence Wallace is a 63 y.o. male with:  Coronary artery disease  S/p NSTEMI 12/2018: DES x1 to LCx  Tobacco use  Hypertension  Hyperlipidemia  AAA  CT 05/2019: 7.4 cm  Mr. Lawrence Wallace was admitted in April 2020 for a non-ST elevation myocardial infarction.  His LCx was occluded and this was treated with a drug-eluting stent.  He had moderate disease elsewhere which was treated medically.  EF was preserved on echocardiogram.  He recently saw Dr. Carlis Abbott with vascular surgery for an abdominal aortic aneurysm.  CT angiogram demonstrated a 7.4 cm infrarenal abdominal aortic aneurysm.  He is scheduled for open repair 06/12/2019.  He is here alone today.  Since last seen, he has not had chest discomfort, significant shortness of breath, syncope, orthopnea or lower extremity swelling.  He does have leg discomfort at night.  He has to get up to walk.  He is not sure if he snores.  He does not have symptoms of claudication.   Prior CV studies:   The following studies were reviewed today:  CTA abdomen pelvis 06/05/2019 IMPRESSION: VASCULAR  1. Large non-ruptured infrarenal abdominal aortic aneurysm with maximal dimensions of approximately 7.2 x 7.4 cm. The infrarenal aneurysm neck is extremely angulated and tortuous. 2. Significant aneurysmal disease extends into the right common iliac artery with maximal diameter of 5.0-5.1 cm. 3. Mild aneurysmal disease of the right internal iliac artery measuring 1.4 cm. 4. Aneurysmal disease of the left common iliac artery measuring up to 2.3 cm.  NON-VASCULAR  Stable 7 mm right middle lobe lung nodule.   Echocardiogram 01/16/2019 EF [id number], grade 2 diastolic dysfunction, normal RV SF  Cardiac catheterization 01/16/2019 LAD proximal 50, mid 50 LCx ostial 50, proximal 100 RCA mid 60 EF >65 PCI: 2.5 x 16 mm Synergy DES to the LCx    Past Medical History:  Diagnosis Date  . Asthma   . CAD in native artery    a. cath 12/2018 - total occlusion of the mid Circumflex artery s/p successful PTCA/DES x 1. Moderate non-obstructive disease in the mid RCA and mid LAD.  Marland Kitchen Cancer (HCC)    Skin  . GERD (gastroesophageal reflux disease)   . Hyperlipidemia LDL goal <70 01/17/2019  . Hypertension 01/17/2019  . NSTEMI (non-ST elevated myocardial infarction) ([place]) 01/15/2019  . Tobacco abuse    Surgical Hx: The patient  has a past surgical history that includes Foot fracture surgery; LEFT HEART CATH AND CORONARY ANGIOGRAPHY (N/A, 01/16/2019); and CORONARY STENT INTERVENTION (N/A, 01/16/2019).   Current Medications: Current Meds  Medication Sig  . clopidogrel (PLAVIX) 75 MG tablet Take 1 tablet (75 mg total) by mouth daily.  . metoprolol tartrate (LOPRESSOR) 25 MG tablet Take 1 tablet (25 mg total) by mouth 2 (two) times daily.  . Multiple Vitamin (MULTIVITAMIN) tablet Take 1 tablet by mouth daily.  . nitroGLYCERIN (NITROSTAT) 0.4 MG SL tablet Place 1 tablet (0.4 mg total) under the tongue every 5 (five) minutes x 3 doses as needed for chest pain.  . rosuvastatin (CRESTOR) 40 MG tablet Take 1 tablet (40  mg total) by mouth daily.  . varenicline (CHANTIX STARTING MONTH PAK) 0.5 MG X 11 & 1 MG X 42 tablet Take one 0.5 mg tablet by mouth once daily for 3 days, then increase to one 0.5 mg tablet twice daily for 4 days, then increase to one 1 mg tablet twice daily.     Allergies:   Patient has no known allergies.   Social History   Tobacco Use  . Smoking status: Current Every Day Smoker    Packs/day: 1.50    Years: 45.00    Pack years: 67.50    Types: Cigarettes  . Smokeless tobacco: Never Used  Substance  Use Topics  . Alcohol use: Yes    Alcohol/week: 1.0 standard drinks    Types: 1 Shots of liquor per week    Comment: 1 mixed liquor every night  . Drug use: Not on file     Family Hx: The patient's family history includes Cancer in his father and mother; Stroke in his mother.  ROS:   Please see the history of present illness.    Review of Systems  Gastrointestinal: Negative for hematochezia.  Genitourinary: Negative for hematuria.   All other systems reviewed and are negative.   EKGs/Labs/Other Test Reviewed:    EKG:  EKG is  ordered today.  The ekg ordered today demonstrates normal sinus rhythm, heart rate 73, normal axis, no ST-T wave changes, QTC 436  Recent Labs: 05/08/2019: ALT 24; BUN 10; Creatinine, Ser 0.80; Hemoglobin 15.8; Platelets 177.0; Potassium 3.9; Sodium 138   Recent Lipid Panel Lab Results  Component Value Date/Time   CHOL 89 (L) 03/11/2019 08:24 AM   TRIG 145 03/11/2019 08:24 AM   HDL 30 (L) 03/11/2019 08:24 AM   CHOLHDL 3.0 03/11/2019 08:24 AM   CHOLHDL 5.2 01/16/2019 03:55 AM   LDLCALC 30 03/11/2019 08:24 AM   LDLDIRECT 125.0 12/10/2017 09:20 AM    Physical Exam:    VS:  BP 112/68   Pulse 75   Wt 206 lb (93.4 kg)   SpO2 95%   BMI 26.45 kg/m     Wt Readings from Last 3 Encounters:  06/11/19 206 lb (93.4 kg)  06/10/19 206 lb (93.4 kg)  05/13/19 206 lb (93.4 kg)     Physical Exam  Constitutional: He is oriented to person, place, and time. He appears well-developed and well-nourished. No distress.  HENT:  Head: Normocephalic and atraumatic.  Eyes: No scleral icterus.  Neck: No JVD present. Carotid bruit is not present. No thyromegaly present.  Cardiovascular: Normal rate, regular rhythm and normal heart sounds.  No murmur heard. Pulmonary/Chest: Effort normal and breath sounds normal. He has no rales.  Abdominal: Soft. There is no hepatomegaly.  Musculoskeletal:        General: No edema.  Lymphadenopathy:    He has no cervical  adenopathy.  Neurological: He is alert and oriented to person, place, and time.  Skin: Skin is warm and dry.  Psychiatric: He has a normal mood and affect.    ASSESSMENT & PLAN:    1. Coronary artery disease involving native coronary artery of native heart without angina pectoris History of NSTEMI in April 2020 treated with a drug-eluting stent to the proximal circumflex.  He is doing well without anginal symptoms.  His aspirin was stopped several weeks ago due to hemoptysis.  He has been on clopidogrel alone since that time.  Continue beta-blocker, statin therapy.  2. AAA (abdominal aortic aneurysm) without rupture ([place]) 3. Preoperative  cardiovascular examination He has a 7.4 cm aneurysm.  Open repair is scheduled next week with Dr. Carlis Abbott.  His perioperative risk for major adverse cardiac event is elevated at 6.6% according to the revised cardiac risk index.  However, he has a good functional status, achieving 6.61 METs according to the [facility] Activity Status Index.  Therefore, he may proceed with his aneurysm repair at acceptable risk.  Our service is available in the perioperative period as necessary.  I did review his case today with one of our interventional cardiologists.  He needs to undergo urgent repair of his abdominal aortic aneurysm.  It has been 5 months since his myocardial infarction.  Therefore, it is probably safe to hold his Plavix.  There is a small risk of stent thrombosis.  However, his risk of aneurysm rupture is greater.  -Hold Plavix for upcoming AAA repair  -Start aspirin 81 mg daily while off Plavix  -He can stop aspirin once his Plavix is resumed postop.  4. Essential hypertension The patient's blood pressure is controlled on his current regimen.  Continue current therapy.   5. Hyperlipidemia LDL goal <70 LDL optimal on most recent lab work.  Continue current Rx.    6. Tobacco abuse He is trying to quit.   Dispo:  Return in about 3 months (around 09/10/2019) for  Routine Follow Up, w/ Dr. Burt Knack, or Richardson Dopp, PA-C.   Medication Adjustments/Labs and Tests Ordered: Current medicines are reviewed at length with the patient today.  Concerns regarding medicines are outlined above.  Tests Ordered: Orders Placed This Encounter  Procedures  . EKG 12-Lead   Medication Changes: Meds ordered this encounter  Medications  . aspirin EC 81 MG tablet    Sig: Take 1 tablet (81 mg total) by mouth daily.    Dispense:  90 tablet    Refill:  3    Signed, Richardson Dopp, PA-C  06/11/2019 3:16 PM    [facility] [place], [place], [place]  [address] Phone: [phone]; Fax: [phone]

## 2019-06-11 NOTE — Patient Instructions (Signed)
Medication Instructions:  Hold Plavix for Surgery Restart Aspirin 81 mg until surgery After surgery, restart Plavix at surgeons discretion  If you need a refill on your cardiac medications before your next appointment, please call your pharmacy.   Lab work: none If you have labs (blood work) drawn today and your tests are completely normal, you will receive your results only by: Marland Kitchen MyChart Message (if you have MyChart) OR . A paper copy in the mail If you have any lab test that is abnormal or we need to change your treatment, we will call you to review the results.  Testing/Procedures: none  Follow-Up: 3-4 months with Dr Burt Knack or Richardson Dopp, PA At [facility], you and your health needs are our priority.  As part of our continuing mission to provide you with exceptional heart care, we have created designated Provider Care Teams.  These Care Teams include your primary Cardiologist (physician) and Advanced Practice Providers (APPs -  Physician Assistants and Nurse Practitioners) who all work together to provide you with the care you need, when you need it. .   Any Other Special Instructions Will Be Listed Below (If Applicable).

## 2019-06-13 NOTE — Pre-Procedure Instructions (Addendum)
Lawrence Wallace  06/13/2019     Your procedure is scheduled on Wednesday, September 30  Report to [facility], Main Entrance or Entrance "A" at [facility] AM                Your surgery or procedure is scheduled for 10:15 AM               For any questions or problems the morning of surgery: [phone]  This is the number for the Pre- Surgical Desk.    Remember:  Do not eat or drink after midnight Tuesday September 29   Take these medicines the morning of surgery with A SIP OF WATER :              metoprolol tartrate (LOPRESSOR), Aspirin Take if needed:  nitroGLYCERIN (NITROSTAT)   Follow Dr. Ainsley Spinner instructions regarding Plavix.  STOP taking Aspirin Products (Goody Powder, Excedrin Migraine), Ibuprofen (Advil), Naproxen (Aleve), Vitamins and Herbal Products (ie Fish Oil).  Special instructions:   [facility]- Preparing For Surgery  Before surgery, you can play an important role. Because skin is not sterile, your skin needs to be as free of germs as possible. You can reduce the number of germs on your skin by washing with CHG (chlorahexidine gluconate) Soap before surgery.  CHG is an antiseptic cleaner which kills germs and bonds with the skin to continue killing germs even after washing.    Oral Hygiene is also important to reduce your risk of infection.  Remember - BRUSH YOUR TEETH THE MORNING OF SURGERY WITH YOUR REGULAR TOOTHPASTE  Please do not use if you have an allergy to CHG or antibacterial soaps. If your skin becomes reddened/irritated stop using the CHG.  Do not shave (including legs and underarms) for at least 48 hours prior to first CHG shower. It is OK to shave your face.  Please follow these instructions carefully.   1. Shower the NIGHT BEFORE SURGERY and the MORNING OF SURGERY with CHG.   2. If you chose to wash your hair, wash your hair first as usual with your normal shampoo.  3. After you shampoo, wash your face and private area with the soap you use at  home, then rinse your hair and body thoroughly to remove the shampoo and soap.  4. Use CHG as you would any other liquid soap. You can apply CHG directly to the skin and wash gently with a scrungie or a clean washcloth.   5. Apply the CHG Soap to your body ONLY FROM THE NECK DOWN.  Do not use on open wounds or open sores. Avoid contact with your eyes, ears, mouth and genitals (private parts).   6. Wash thoroughly, paying special attention to the area where your surgery will be performed.  7. Thoroughly rinse your body with warm water from the neck down.  8. DO NOT shower/wash with your normal soap after using and rinsing off the CHG Soap.  9. Pat yourself dry with a CLEAN TOWEL.  10. Wear CLEAN PAJAMAS to bed the night before surgery, wear comfortable clothes the morning of surgery  11. Place CLEAN SHEETS on your bed the night of your first shower and DO NOT SLEEP WITH PETS.  Day of Surgery: Shower as instructed above. Do not wear lotions, powders, or perfumes, or deodorant. Please wear clean clothes to the hospital/surgery center.   Remember to brush your teeth WITH YOUR REGULAR TOOTHPASTE Do not wear jewelry, make-up or nail  polish.             Men may shave face and neck.  Do not bring valuables to the hospital.  [facility] is not responsible for any belongings or valuables.  Contacts, dentures or bridgework may not be worn into surgery.  Leave your suitcase in the car.  After surgery it may be brought to your room.  For patients admitted to the hospital, discharge time will be determined by your treatment team.  Patients discharged the day of surgery will not be allowed to drive home.   Please read over the following fact sheets that you were given: Coughing and Deep Breathing, Pain Booklet, Patient Instructions for Mupirocin Application and Surgical Site Infections

## 2019-06-16 ENCOUNTER — Other Ambulatory Visit: Payer: Self-pay

## 2019-06-16 ENCOUNTER — Encounter (HOSPITAL_COMMUNITY)
Admission: RE | Admit: 2019-06-16 | Discharge: 2019-06-16 | Disposition: A | Discharge status: Home / Self Care | Payer: Medicare Other | Source: Ambulatory Visit | Attending: Vascular Surgery | Admitting: Vascular Surgery

## 2019-06-16 ENCOUNTER — Encounter (HOSPITAL_COMMUNITY): Payer: Self-pay

## 2019-06-16 ENCOUNTER — Other Ambulatory Visit (HOSPITAL_COMMUNITY)
Admission: RE | Admit: 2019-06-16 | Discharge: 2019-06-16 | Disposition: A | Discharge status: Home / Self Care | Payer: Medicare Other | Source: Ambulatory Visit | Attending: Vascular Surgery | Admitting: Vascular Surgery

## 2019-06-16 DIAGNOSIS — I714 Abdominal aortic aneurysm, without rupture: Secondary | ICD-10-CM | POA: Insufficient documentation

## 2019-06-16 DIAGNOSIS — Z01812 Encounter for preprocedural laboratory examination: Secondary | ICD-10-CM | POA: Insufficient documentation

## 2019-06-16 DIAGNOSIS — Z20828 Contact with and (suspected) exposure to other viral communicable diseases: Secondary | ICD-10-CM | POA: Diagnosis not present

## 2019-06-16 HISTORY — DX: Atherosclerotic heart disease of native coronary artery without angina pectoris: I25.10

## 2019-06-16 LAB — BLOOD GAS, ARTERIAL
Acid-base deficit: 0.3 mmol/L (ref 0.0–2.0)
Bicarbonate: 23.4 mmol/L (ref 20.0–28.0)
Drawn by: 470591
FIO2: 21
O2 Saturation: 97.4 %
Patient temperature: 98.6
pCO2 arterial: 35.2 mmHg (ref 32.0–48.0)
pH, Arterial: 7.438 (ref 7.350–7.450)
pO2, Arterial: 89.4 mmHg (ref 83.0–108.0)

## 2019-06-16 LAB — PROTIME-INR
INR: 1 (ref 0.8–1.2)
Prothrombin Time: 13.5 seconds (ref 11.4–15.2)

## 2019-06-16 LAB — URINALYSIS, ROUTINE W REFLEX MICROSCOPIC
Bacteria, UA: NONE SEEN
Bilirubin Urine: NEGATIVE
Glucose, UA: NEGATIVE mg/dL
Hgb urine dipstick: NEGATIVE
Ketones, ur: NEGATIVE mg/dL
Nitrite: NEGATIVE
Protein, ur: NEGATIVE mg/dL
Specific Gravity, Urine: 1.006 (ref 1.005–1.030)
pH: 6 (ref 5.0–8.0)

## 2019-06-16 LAB — COMPREHENSIVE METABOLIC PANEL
ALT: 24 U/L (ref 0–44)
AST: 28 U/L (ref 15–41)
Albumin: 3.8 g/dL (ref 3.5–5.0)
Alkaline Phosphatase: 60 U/L (ref 38–126)
Anion gap: 12 (ref 5–15)
BUN: 10 mg/dL (ref 8–23)
CO2: 21 mmol/L — ABNORMAL LOW (ref 22–32)
Calcium: 9.5 mg/dL (ref 8.9–10.3)
Chloride: 103 mmol/L (ref 98–111)
Creatinine, Ser: 0.8 mg/dL (ref 0.61–1.24)
GFR calc Af Amer: >60 mL/min (ref >60)
GFR calc non Af Amer: >60 mL/min (ref >60)
Glucose, Bld: 107 mg/dL — ABNORMAL HIGH (ref 70–99)
Potassium: 4.8 mmol/L (ref 3.5–5.1)
Sodium: 136 mmol/L (ref 135–145)
Total Bilirubin: 1.2 mg/dL (ref 0.3–1.2)
Total Protein: 6.9 g/dL (ref 6.5–8.1)

## 2019-06-16 LAB — CBC
HCT: 52.5 % — ABNORMAL HIGH (ref 39.0–52.0)
Hemoglobin: 17.1 g/dL — ABNORMAL HIGH (ref 13.0–17.0)
MCH: 30.9 pg (ref 26.0–34.0)
MCHC: 32.6 g/dL (ref 30.0–36.0)
MCV: 94.9 fL (ref 80.0–100.0)
Platelets: 203 10*3/uL (ref 150–400)
RBC: 5.53 MIL/uL (ref 4.22–5.81)
RDW: 13.4 % (ref 11.5–15.5)
WBC: 8.2 10*3/uL (ref 4.0–10.5)
nRBC: 0 % (ref 0.0–0.2)

## 2019-06-16 LAB — SARS CORONAVIRUS 2 (TAT 6-24 HRS): SARS Coronavirus 2: NEGATIVE

## 2019-06-16 LAB — APTT: aPTT: 29 seconds (ref 24–36)

## 2019-06-16 LAB — SURGICAL PCR SCREEN
MRSA, PCR: NEGATIVE
Staphylococcus aureus: POSITIVE — AB

## 2019-06-16 LAB — PREPARE RBC (CROSSMATCH)

## 2019-06-16 LAB — ABO/RH: ABO/RH(D): A NEG

## 2019-06-16 NOTE — Progress Notes (Addendum)
PCP - Alma Friendly, NP at [place]  Cardiologist - Dr. Burt Knack  Chest x-ray - 01/15/2019  EKG - 01/17/2019  Stress Test - no  ECHO - 01/16/2019  Cardiac Cath - 01/16/2019  Sleep Study - no CPAP - no  LABS- CBC, CMP, Pt, PTT, T/S, UA, ABG  ASA- continue 81 mg Plavix stopped 06/11/2019  ERAS-no  HA1C-na Fasting Blood Sugar - na Checks Blood Sugar ____0 times a day  Anesthesia-  Pt denies having chest pain, sob, or fever at this time. All instructions explained to the pt, with a verbal understanding of the material. Pt agrees to go over the instructions while at home for a better understanding. Pt also instructed to self quarantine after being tested for COVID-19. The opportunity to ask questions was provided.

## 2019-06-17 ENCOUNTER — Encounter (HOSPITAL_COMMUNITY): Payer: Self-pay | Admitting: Certified Registered Nurse Anesthetist

## 2019-06-18 ENCOUNTER — Encounter (HOSPITAL_COMMUNITY): Payer: Self-pay

## 2019-06-18 ENCOUNTER — Inpatient Hospital Stay (HOSPITAL_COMMUNITY)
Admission: RE | Admit: 2019-06-18 | Discharge: 2019-07-20 | DRG: 270 | Disposition: E | Payer: Medicare Other | Attending: Vascular Surgery | Admitting: Vascular Surgery

## 2019-06-18 ENCOUNTER — Other Ambulatory Visit: Payer: Self-pay

## 2019-06-18 ENCOUNTER — Encounter (HOSPITAL_COMMUNITY): Admission: RE | Disposition: E | Payer: Self-pay | Source: Home / Self Care | Attending: Vascular Surgery

## 2019-06-18 ENCOUNTER — Inpatient Hospital Stay (HOSPITAL_COMMUNITY): Payer: Medicare Other

## 2019-06-18 ENCOUNTER — Inpatient Hospital Stay (HOSPITAL_COMMUNITY): Payer: Medicare Other | Admitting: Certified Registered Nurse Anesthetist

## 2019-06-18 DIAGNOSIS — I4819 Other persistent atrial fibrillation: Secondary | ICD-10-CM | POA: Diagnosis not present

## 2019-06-18 DIAGNOSIS — I998 Other disorder of circulatory system: Secondary | ICD-10-CM | POA: Diagnosis not present

## 2019-06-18 DIAGNOSIS — R579 Shock, unspecified: Secondary | ICD-10-CM | POA: Diagnosis not present

## 2019-06-18 DIAGNOSIS — Z9889 Other specified postprocedural states: Secondary | ICD-10-CM | POA: Diagnosis not present

## 2019-06-18 DIAGNOSIS — E785 Hyperlipidemia, unspecified: Secondary | ICD-10-CM | POA: Diagnosis present

## 2019-06-18 DIAGNOSIS — I4891 Unspecified atrial fibrillation: Secondary | ICD-10-CM | POA: Diagnosis not present

## 2019-06-18 DIAGNOSIS — J95851 Ventilator associated pneumonia: Secondary | ICD-10-CM | POA: Diagnosis not present

## 2019-06-18 DIAGNOSIS — G9340 Encephalopathy, unspecified: Secondary | ICD-10-CM

## 2019-06-18 DIAGNOSIS — F17210 Nicotine dependence, cigarettes, uncomplicated: Secondary | ICD-10-CM | POA: Diagnosis present

## 2019-06-18 DIAGNOSIS — R578 Other shock: Secondary | ICD-10-CM | POA: Diagnosis not present

## 2019-06-18 DIAGNOSIS — Z85828 Personal history of other malignant neoplasm of skin: Secondary | ICD-10-CM

## 2019-06-18 DIAGNOSIS — Z4659 Encounter for fitting and adjustment of other gastrointestinal appliance and device: Secondary | ICD-10-CM

## 2019-06-18 DIAGNOSIS — J9382 Other air leak: Secondary | ICD-10-CM | POA: Diagnosis not present

## 2019-06-18 DIAGNOSIS — D6959 Other secondary thrombocytopenia: Secondary | ICD-10-CM | POA: Diagnosis not present

## 2019-06-18 DIAGNOSIS — J155 Pneumonia due to Escherichia coli: Secondary | ICD-10-CM | POA: Diagnosis not present

## 2019-06-18 DIAGNOSIS — E870 Hyperosmolality and hypernatremia: Secondary | ICD-10-CM | POA: Diagnosis not present

## 2019-06-18 DIAGNOSIS — Z955 Presence of coronary angioplasty implant and graft: Secondary | ICD-10-CM

## 2019-06-18 DIAGNOSIS — I714 Abdominal aortic aneurysm, without rupture, unspecified: Secondary | ICD-10-CM | POA: Diagnosis present

## 2019-06-18 DIAGNOSIS — Z66 Do not resuscitate: Secondary | ICD-10-CM | POA: Diagnosis not present

## 2019-06-18 DIAGNOSIS — K7200 Acute and subacute hepatic failure without coma: Secondary | ICD-10-CM | POA: Diagnosis not present

## 2019-06-18 DIAGNOSIS — I952 Hypotension due to drugs: Secondary | ICD-10-CM | POA: Diagnosis not present

## 2019-06-18 DIAGNOSIS — K7589 Other specified inflammatory liver diseases: Secondary | ICD-10-CM | POA: Diagnosis not present

## 2019-06-18 DIAGNOSIS — R739 Hyperglycemia, unspecified: Secondary | ICD-10-CM | POA: Diagnosis not present

## 2019-06-18 DIAGNOSIS — J9601 Acute respiratory failure with hypoxia: Secondary | ICD-10-CM | POA: Diagnosis not present

## 2019-06-18 DIAGNOSIS — I2510 Atherosclerotic heart disease of native coronary artery without angina pectoris: Secondary | ICD-10-CM | POA: Diagnosis present

## 2019-06-18 DIAGNOSIS — N179 Acute kidney failure, unspecified: Secondary | ICD-10-CM

## 2019-06-18 DIAGNOSIS — I252 Old myocardial infarction: Secondary | ICD-10-CM

## 2019-06-18 DIAGNOSIS — D62 Acute posthemorrhagic anemia: Secondary | ICD-10-CM | POA: Diagnosis not present

## 2019-06-18 DIAGNOSIS — T4275XA Adverse effect of unspecified antiepileptic and sedative-hypnotic drugs, initial encounter: Secondary | ICD-10-CM | POA: Diagnosis not present

## 2019-06-18 DIAGNOSIS — I10 Essential (primary) hypertension: Secondary | ICD-10-CM | POA: Diagnosis present

## 2019-06-18 DIAGNOSIS — Z452 Encounter for adjustment and management of vascular access device: Secondary | ICD-10-CM

## 2019-06-18 DIAGNOSIS — Z79899 Other long term (current) drug therapy: Secondary | ICD-10-CM

## 2019-06-18 DIAGNOSIS — Z7902 Long term (current) use of antithrombotics/antiplatelets: Secondary | ICD-10-CM

## 2019-06-18 DIAGNOSIS — Y848 Other medical procedures as the cause of abnormal reaction of the patient, or of later complication, without mention of misadventure at the time of the procedure: Secondary | ICD-10-CM | POA: Diagnosis not present

## 2019-06-18 DIAGNOSIS — M62242 Nontraumatic ischemic infarction of muscle, left hand: Secondary | ICD-10-CM | POA: Diagnosis not present

## 2019-06-18 DIAGNOSIS — A419 Sepsis, unspecified organism: Secondary | ICD-10-CM | POA: Diagnosis not present

## 2019-06-18 DIAGNOSIS — Z978 Presence of other specified devices: Secondary | ICD-10-CM

## 2019-06-18 DIAGNOSIS — E874 Mixed disorder of acid-base balance: Secondary | ICD-10-CM | POA: Diagnosis not present

## 2019-06-18 DIAGNOSIS — E876 Hypokalemia: Secondary | ICD-10-CM | POA: Diagnosis not present

## 2019-06-18 DIAGNOSIS — Z9289 Personal history of other medical treatment: Secondary | ICD-10-CM

## 2019-06-18 DIAGNOSIS — J45909 Unspecified asthma, uncomplicated: Secondary | ICD-10-CM | POA: Diagnosis present

## 2019-06-18 DIAGNOSIS — N170 Acute kidney failure with tubular necrosis: Secondary | ICD-10-CM | POA: Diagnosis not present

## 2019-06-18 DIAGNOSIS — I973 Postprocedural hypertension: Secondary | ICD-10-CM | POA: Diagnosis not present

## 2019-06-18 DIAGNOSIS — R6521 Severe sepsis with septic shock: Secondary | ICD-10-CM | POA: Diagnosis not present

## 2019-06-18 DIAGNOSIS — G9341 Metabolic encephalopathy: Secondary | ICD-10-CM | POA: Diagnosis not present

## 2019-06-18 DIAGNOSIS — Z8679 Personal history of other diseases of the circulatory system: Secondary | ICD-10-CM | POA: Diagnosis not present

## 2019-06-18 DIAGNOSIS — I723 Aneurysm of iliac artery: Secondary | ICD-10-CM | POA: Diagnosis not present

## 2019-06-18 DIAGNOSIS — J811 Chronic pulmonary edema: Secondary | ICD-10-CM | POA: Diagnosis not present

## 2019-06-18 DIAGNOSIS — E8809 Other disorders of plasma-protein metabolism, not elsewhere classified: Secondary | ICD-10-CM | POA: Diagnosis not present

## 2019-06-18 DIAGNOSIS — I361 Nonrheumatic tricuspid (valve) insufficiency: Secondary | ICD-10-CM | POA: Diagnosis not present

## 2019-06-18 DIAGNOSIS — K219 Gastro-esophageal reflux disease without esophagitis: Secondary | ICD-10-CM | POA: Diagnosis present

## 2019-06-18 DIAGNOSIS — Z823 Family history of stroke: Secondary | ICD-10-CM

## 2019-06-18 DIAGNOSIS — J9600 Acute respiratory failure, unspecified whether with hypoxia or hypercapnia: Secondary | ICD-10-CM

## 2019-06-18 DIAGNOSIS — I959 Hypotension, unspecified: Secondary | ICD-10-CM

## 2019-06-18 DIAGNOSIS — A4151 Sepsis due to Escherichia coli [E. coli]: Secondary | ICD-10-CM | POA: Diagnosis not present

## 2019-06-18 DIAGNOSIS — R451 Restlessness and agitation: Secondary | ICD-10-CM | POA: Diagnosis not present

## 2019-06-18 DIAGNOSIS — J810 Acute pulmonary edema: Secondary | ICD-10-CM | POA: Diagnosis not present

## 2019-06-18 DIAGNOSIS — E875 Hyperkalemia: Secondary | ICD-10-CM | POA: Diagnosis not present

## 2019-06-18 DIAGNOSIS — Z515 Encounter for palliative care: Secondary | ICD-10-CM | POA: Diagnosis not present

## 2019-06-18 DIAGNOSIS — E872 Acidosis: Secondary | ICD-10-CM | POA: Diagnosis not present

## 2019-06-18 DIAGNOSIS — J9690 Respiratory failure, unspecified, unspecified whether with hypoxia or hypercapnia: Secondary | ICD-10-CM

## 2019-06-18 DIAGNOSIS — D500 Iron deficiency anemia secondary to blood loss (chronic): Secondary | ICD-10-CM | POA: Diagnosis not present

## 2019-06-18 DIAGNOSIS — E861 Hypovolemia: Secondary | ICD-10-CM | POA: Diagnosis not present

## 2019-06-18 DIAGNOSIS — N990 Postprocedural (acute) (chronic) kidney failure: Secondary | ICD-10-CM | POA: Diagnosis not present

## 2019-06-18 DIAGNOSIS — R0902 Hypoxemia: Secondary | ICD-10-CM

## 2019-06-18 DIAGNOSIS — E8770 Fluid overload, unspecified: Secondary | ICD-10-CM | POA: Diagnosis not present

## 2019-06-18 DIAGNOSIS — R571 Hypovolemic shock: Secondary | ICD-10-CM | POA: Diagnosis not present

## 2019-06-18 HISTORY — DX: Iron deficiency anemia secondary to blood loss (chronic): D50.0

## 2019-06-18 HISTORY — PX: ABDOMINAL AORTIC ANEURYSM REPAIR: SHX42

## 2019-06-18 LAB — PREPARE RBC (CROSSMATCH)

## 2019-06-18 LAB — POCT I-STAT 7, (LYTES, BLD GAS, ICA,H+H)
Acid-base deficit: 5 mmol/L — ABNORMAL HIGH (ref 0.0–2.0)
Acid-base deficit: 5 mmol/L — ABNORMAL HIGH (ref 0.0–2.0)
Acid-base deficit: 3 mmol/L — ABNORMAL HIGH (ref 0.0–2.0)
Acid-base deficit: 10 mmol/L — ABNORMAL HIGH (ref 0.0–2.0)
Acid-base deficit: 4 mmol/L — ABNORMAL HIGH (ref 0.0–2.0)
Acid-base deficit: 5 mmol/L — ABNORMAL HIGH (ref 0.0–2.0)
Bicarbonate: 24 mmol/L (ref 20.0–28.0)
Bicarbonate: 22.6 mmol/L (ref 20.0–28.0)
Bicarbonate: 23.3 mmol/L (ref 20.0–28.0)
Bicarbonate: 19.7 mmol/L — ABNORMAL LOW (ref 20.0–28.0)
Bicarbonate: 24.6 mmol/L (ref 20.0–28.0)
Bicarbonate: 24.3 mmol/L (ref 20.0–28.0)
Calcium, Ion: 1.3 mmol/L (ref 1.15–1.40)
Calcium, Ion: 1.23 mmol/L (ref 1.15–1.40)
Calcium, Ion: 1.12 mmol/L — ABNORMAL LOW (ref 1.15–1.40)
Calcium, Ion: 1.1 mmol/L — ABNORMAL LOW (ref 1.15–1.40)
Calcium, Ion: 1.23 mmol/L (ref 1.15–1.40)
Calcium, Ion: 1.26 mmol/L (ref 1.15–1.40)
HCT: 46 % (ref 39.0–52.0)
HCT: 42 % (ref 39.0–52.0)
HCT: 38 % — ABNORMAL LOW (ref 39.0–52.0)
HCT: 43 % (ref 39.0–52.0)
HCT: 42 % (ref 39.0–52.0)
HCT: 42 % (ref 39.0–52.0)
Hemoglobin: 15.6 g/dL (ref 13.0–17.0)
Hemoglobin: 14.3 g/dL (ref 13.0–17.0)
Hemoglobin: 12.9 g/dL — ABNORMAL LOW (ref 13.0–17.0)
Hemoglobin: 14.6 g/dL (ref 13.0–17.0)
Hemoglobin: 14.3 g/dL (ref 13.0–17.0)
Hemoglobin: 14.3 g/dL (ref 13.0–17.0)
O2 Saturation: 95 %
O2 Saturation: 97 %
O2 Saturation: 97 %
O2 Saturation: 98 %
O2 Saturation: 99 %
O2 Saturation: 95 %
Patient temperature: 36.5
Potassium: 4.4 mmol/L (ref 3.5–5.1)
Potassium: 4.6 mmol/L (ref 3.5–5.1)
Potassium: 4.7 mmol/L (ref 3.5–5.1)
Potassium: 5.8 mmol/L — ABNORMAL HIGH (ref 3.5–5.1)
Potassium: 6 mmol/L — ABNORMAL HIGH (ref 3.5–5.1)
Potassium: 5.2 mmol/L — ABNORMAL HIGH (ref 3.5–5.1)
Sodium: 139 mmol/L (ref 135–145)
Sodium: 137 mmol/L (ref 135–145)
Sodium: 139 mmol/L (ref 135–145)
Sodium: 138 mmol/L (ref 135–145)
Sodium: 139 mmol/L (ref 135–145)
Sodium: 139 mmol/L (ref 135–145)
TCO2: 26 mmol/L (ref 22–32)
TCO2: 24 mmol/L (ref 22–32)
TCO2: 25 mmol/L (ref 22–32)
TCO2: 21 mmol/L — ABNORMAL LOW (ref 22–32)
TCO2: 26 mmol/L (ref 22–32)
TCO2: 26 mmol/L (ref 22–32)
pCO2 arterial: 57 mmHg — ABNORMAL HIGH (ref 32.0–48.0)
pCO2 arterial: 52.3 mmHg — ABNORMAL HIGH (ref 32.0–48.0)
pCO2 arterial: 47.5 mmHg (ref 32.0–48.0)
pCO2 arterial: 58.9 mmHg — ABNORMAL HIGH (ref 32.0–48.0)
pCO2 arterial: 59.2 mmHg — ABNORMAL HIGH (ref 32.0–48.0)
pCO2 arterial: 62.8 mmHg — ABNORMAL HIGH (ref 32.0–48.0)
pH, Arterial: 7.232 — ABNORMAL LOW (ref 7.350–7.450)
pH, Arterial: 7.244 — ABNORMAL LOW (ref 7.350–7.450)
pH, Arterial: 7.297 — ABNORMAL LOW (ref 7.350–7.450)
pH, Arterial: 7.132 — CL (ref 7.350–7.450)
pH, Arterial: 7.226 — ABNORMAL LOW (ref 7.350–7.450)
pH, Arterial: 7.193 — CL (ref 7.350–7.450)
pO2, Arterial: 89 mmHg (ref 83.0–108.0)
pO2, Arterial: 102 mmHg (ref 83.0–108.0)
pO2, Arterial: 99 mmHg (ref 83.0–108.0)
pO2, Arterial: 148 mmHg — ABNORMAL HIGH (ref 83.0–108.0)
pO2, Arterial: 185 mmHg — ABNORMAL HIGH (ref 83.0–108.0)
pO2, Arterial: 91 mmHg (ref 83.0–108.0)

## 2019-06-18 LAB — BASIC METABOLIC PANEL
Anion gap: 15 (ref 5–15)
BUN: 15 mg/dL (ref 8–23)
CO2: 16 mmol/L — ABNORMAL LOW (ref 22–32)
Calcium: 8 mg/dL — ABNORMAL LOW (ref 8.9–10.3)
Chloride: 108 mmol/L (ref 98–111)
Creatinine, Ser: 1.76 mg/dL — ABNORMAL HIGH (ref 0.61–1.24)
GFR calc Af Amer: 46 mL/min — ABNORMAL LOW (ref >60)
GFR calc non Af Amer: 40 mL/min — ABNORMAL LOW (ref >60)
Glucose, Bld: 188 mg/dL — ABNORMAL HIGH (ref 70–99)
Potassium: 5.8 mmol/L — ABNORMAL HIGH (ref 3.5–5.1)
Sodium: 139 mmol/L (ref 135–145)

## 2019-06-18 LAB — COMPREHENSIVE METABOLIC PANEL
ALT: 95 U/L — ABNORMAL HIGH (ref 0–44)
AST: 134 U/L — ABNORMAL HIGH (ref 15–41)
Albumin: 2.5 g/dL — ABNORMAL LOW (ref 3.5–5.0)
Alkaline Phosphatase: 37 U/L — ABNORMAL LOW (ref 38–126)
Anion gap: 11 (ref 5–15)
BUN: 13 mg/dL (ref 8–23)
CO2: 23 mmol/L (ref 22–32)
Calcium: 8.2 mg/dL — ABNORMAL LOW (ref 8.9–10.3)
Chloride: 105 mmol/L (ref 98–111)
Creatinine, Ser: 1.3 mg/dL — ABNORMAL HIGH (ref 0.61–1.24)
GFR calc Af Amer: >60 mL/min (ref >60)
GFR calc non Af Amer: 57 mL/min — ABNORMAL LOW (ref >60)
Glucose, Bld: 253 mg/dL — ABNORMAL HIGH (ref 70–99)
Potassium: 5.2 mmol/L — ABNORMAL HIGH (ref 3.5–5.1)
Sodium: 139 mmol/L (ref 135–145)
Total Bilirubin: 1.5 mg/dL — ABNORMAL HIGH (ref 0.3–1.2)
Total Protein: 4.1 g/dL — ABNORMAL LOW (ref 6.5–8.1)

## 2019-06-18 LAB — CBC
HCT: 44.3 % (ref 39.0–52.0)
Hemoglobin: 14.3 g/dL (ref 13.0–17.0)
MCH: 31 pg (ref 26.0–34.0)
MCHC: 32.3 g/dL (ref 30.0–36.0)
MCV: 96.1 fL (ref 80.0–100.0)
Platelets: UNDETERMINED 10*3/uL (ref 150–400)
RBC: 4.61 MIL/uL (ref 4.22–5.81)
RDW: 13.9 % (ref 11.5–15.5)
WBC: 12.3 10*3/uL — ABNORMAL HIGH (ref 4.0–10.5)
nRBC: 0 % (ref 0.0–0.2)

## 2019-06-18 LAB — POCT ACTIVATED CLOTTING TIME: Activated Clotting Time: 257 seconds

## 2019-06-18 LAB — PROTIME-INR
INR: 1.5 — ABNORMAL HIGH (ref 0.8–1.2)
Prothrombin Time: 17.6 seconds — ABNORMAL HIGH (ref 11.4–15.2)

## 2019-06-18 LAB — PHOSPHORUS: Phosphorus: 6.6 mg/dL — ABNORMAL HIGH (ref 2.5–4.6)

## 2019-06-18 LAB — APTT: aPTT: 40 seconds — ABNORMAL HIGH (ref 24–36)

## 2019-06-18 LAB — MAGNESIUM: Magnesium: 1.4 mg/dL — ABNORMAL LOW (ref 1.7–2.4)

## 2019-06-18 SURGERY — ANEURYSM ABDOMINAL AORTIC REPAIR
Anesthesia: General | Site: Abdomen

## 2019-06-18 MED ORDER — PROTAMINE SULFATE 10 MG/ML IV SOLN
INTRAVENOUS | Status: AC
  Filled 2019-06-18: qty 5

## 2019-06-18 MED ORDER — SODIUM CHLORIDE 0.9% IV SOLUTION
Freq: Once | INTRAVENOUS | Status: AC
  Administered 2019-06-20: 10 mL/h via INTRAVENOUS

## 2019-06-18 MED ORDER — FENTANYL CITRATE (PF) 100 MCG/2ML IJ SOLN
INTRAMUSCULAR | Status: DC | PRN
  Administered 2019-06-18 (×2): 50 ug via INTRAVENOUS
  Administered 2019-06-18: 150 ug via INTRAVENOUS
  Administered 2019-06-18 (×5): 50 ug via INTRAVENOUS

## 2019-06-18 MED ORDER — DEXMEDETOMIDINE HCL IN NACL 400 MCG/100ML IV SOLN
0.0000 ug/kg/h | INTRAVENOUS | Status: DC
  Administered 2019-06-18: 18:00:00 0.7 ug/kg/h via INTRAVENOUS
  Filled 2019-06-18: qty 100

## 2019-06-18 MED ORDER — NOREPINEPHRINE 16 MG/250ML-% IV SOLN
0.0000 ug/min | INTRAVENOUS | Status: DC
  Administered 2019-06-18: 30 ug/min via INTRAVENOUS
  Administered 2019-06-19: 05:00:00 17 ug/min via INTRAVENOUS
  Administered 2019-06-20: 17:00:00 4 ug/min via INTRAVENOUS
  Administered 2019-06-22: 10 ug/min via INTRAVENOUS
  Administered 2019-06-23: 13:00:00 4 ug/min via INTRAVENOUS
  Administered 2019-06-27: 09:00:00 15 ug/min via INTRAVENOUS
  Administered 2019-06-27: 15:00:00 40 ug/min via INTRAVENOUS
  Filled 2019-06-18 (×7): qty 250

## 2019-06-18 MED ORDER — PROPOFOL 10 MG/ML IV BOLUS
INTRAVENOUS | Status: DC | PRN
  Administered 2019-06-18: 130 mg via INTRAVENOUS

## 2019-06-18 MED ORDER — SODIUM BICARBONATE 8.4 % IV SOLN
INTRAVENOUS | Status: DC | PRN
  Administered 2019-06-18 (×3): 50 meq via INTRAVENOUS

## 2019-06-18 MED ORDER — CHLORHEXIDINE GLUCONATE 0.12% ORAL RINSE (MEDLINE KIT)
15.0000 mL | Freq: Two times a day (BID) | OROMUCOSAL | Status: DC
  Administered 2019-06-18 – 2019-06-27 (×18): 15 mL via OROMUCOSAL

## 2019-06-18 MED ORDER — ROCURONIUM BROMIDE 10 MG/ML (PF) SYRINGE
PREFILLED_SYRINGE | INTRAVENOUS | Status: AC
  Filled 2019-06-18: qty 10

## 2019-06-18 MED ORDER — DEXAMETHASONE SODIUM PHOSPHATE 10 MG/ML IJ SOLN
INTRAMUSCULAR | Status: DC | PRN
  Administered 2019-06-18: 10 mg via INTRAVENOUS

## 2019-06-18 MED ORDER — DOCUSATE SODIUM 100 MG PO CAPS: 100.0000 mg | ORAL_CAPSULE | Freq: Every day | ORAL | Status: DC

## 2019-06-18 MED ORDER — HEMOSTATIC AGENTS (NO CHARGE) OPTIME
TOPICAL | Status: DC | PRN
  Administered 2019-06-18: 4 via TOPICAL

## 2019-06-18 MED ORDER — SODIUM CHLORIDE 0.9% IV SOLUTION: Freq: Once | INTRAVENOUS | Status: DC

## 2019-06-18 MED ORDER — NOREPINEPHRINE BITARTRATE 1 MG/ML IV SOLN
INTRAVENOUS | Status: DC | PRN
  Administered 2019-06-18: 1 mL via INTRAVENOUS
  Administered 2019-06-18 (×6): .5 mL via INTRAVENOUS
  Administered 2019-06-18: 1 mL via INTRAVENOUS

## 2019-06-18 MED ORDER — INSULIN ASPART 100 UNIT/ML ~~LOC~~ SOLN
SUBCUTANEOUS | Status: DC | PRN
  Administered 2019-06-18: 10 [IU] via INTRAVENOUS

## 2019-06-18 MED ORDER — MAGNESIUM SULFATE 2 GM/50ML IV SOLN: 2.0000 g | Freq: Once | INTRAVENOUS | Status: DC | PRN

## 2019-06-18 MED ORDER — CHLORHEXIDINE GLUCONATE CLOTH 2 % EX PADS
6.0000 | MEDICATED_PAD | Freq: Every day | CUTANEOUS | Status: DC
  Administered 2019-06-18 – 2019-06-27 (×10): 6 via TOPICAL

## 2019-06-18 MED ORDER — LACTATED RINGERS IV SOLN
INTRAVENOUS | Status: DC
  Administered 2019-06-19 – 2019-06-20 (×2): via INTRAVENOUS

## 2019-06-18 MED ORDER — FENTANYL CITRATE (PF) 250 MCG/5ML IJ SOLN
INTRAMUSCULAR | Status: AC
  Filled 2019-06-18: qty 5

## 2019-06-18 MED ORDER — ONDANSETRON HCL 4 MG/2ML IJ SOLN: 4.0000 mg | Freq: Four times a day (QID) | INTRAMUSCULAR | Status: DC | PRN

## 2019-06-18 MED ORDER — NOREPINEPHRINE 4 MG/250ML-% IV SOLN
0.0000 ug/min | INTRAVENOUS | Status: DC
  Filled 2019-06-18: qty 250

## 2019-06-18 MED ORDER — METOPROLOL TARTRATE 5 MG/5ML IV SOLN
2.0000 mg | INTRAVENOUS | Status: AC | PRN
  Administered 2019-06-18 – 2019-06-26 (×2): 2.5 mg via INTRAVENOUS
  Filled 2019-06-18: qty 5

## 2019-06-18 MED ORDER — CEFAZOLIN SODIUM-DEXTROSE 2-4 GM/100ML-% IV SOLN
2.0000 g | INTRAVENOUS | Status: AC
  Administered 2019-06-18: 10:00:00 2 g via INTRAVENOUS

## 2019-06-18 MED ORDER — FENTANYL CITRATE (PF) 100 MCG/2ML IJ SOLN
50.0000 ug | INTRAMUSCULAR | Status: DC | PRN
  Administered 2019-06-18: 200 ug via INTRAVENOUS
  Administered 2019-06-19: 100 ug via INTRAVENOUS
  Administered 2019-06-20: 200 ug via INTRAVENOUS
  Administered 2019-06-20 – 2019-06-23 (×2): 50 ug via INTRAVENOUS
  Filled 2019-06-18: qty 4

## 2019-06-18 MED ORDER — DEXMEDETOMIDINE HCL IN NACL 200 MCG/50ML IV SOLN
INTRAVENOUS | Status: DC | PRN
  Administered 2019-06-18: 0.7 ug/kg/h via INTRAVENOUS

## 2019-06-18 MED ORDER — LIDOCAINE 2% (20 MG/ML) 5 ML SYRINGE
INTRAMUSCULAR | Status: DC | PRN
  Administered 2019-06-18: 100 mg via INTRAVENOUS

## 2019-06-18 MED ORDER — MIDAZOLAM HCL 2 MG/2ML IJ SOLN
2.0000 mg | Freq: Once | INTRAMUSCULAR | Status: AC
  Administered 2019-06-18: 09:00:00 2 mg via INTRAVENOUS

## 2019-06-18 MED ORDER — FENTANYL CITRATE (PF) 100 MCG/2ML IJ SOLN
INTRAMUSCULAR | Status: AC
  Administered 2019-06-18: 09:00:00 50 ug via INTRAVENOUS
  Filled 2019-06-18: qty 2

## 2019-06-18 MED ORDER — PROTAMINE SULFATE 10 MG/ML IV SOLN
INTRAVENOUS | Status: DC | PRN
  Administered 2019-06-18: 10 mg via INTRAVENOUS
  Administered 2019-06-18 (×2): 20 mg via INTRAVENOUS

## 2019-06-18 MED ORDER — CEFAZOLIN SODIUM-DEXTROSE 2-4 GM/100ML-% IV SOLN
INTRAVENOUS | Status: AC
  Filled 2019-06-18: qty 100

## 2019-06-18 MED ORDER — SODIUM BICARBONATE 8.4 % IV SOLN
INTRAVENOUS | Status: AC
  Filled 2019-06-18: qty 50

## 2019-06-18 MED ORDER — LACTATED RINGERS IV SOLN
INTRAVENOUS | Status: DC | PRN
  Administered 2019-06-18: 09:00:00 via INTRAVENOUS

## 2019-06-18 MED ORDER — BISACODYL 10 MG RE SUPP: 10.0000 mg | Freq: Every day | RECTAL | Status: DC | PRN

## 2019-06-18 MED ORDER — ONDANSETRON HCL 4 MG/2ML IJ SOLN
INTRAMUSCULAR | Status: AC
  Filled 2019-06-18: qty 2

## 2019-06-18 MED ORDER — ROCURONIUM BROMIDE 10 MG/ML (PF) SYRINGE
PREFILLED_SYRINGE | INTRAVENOUS | Status: AC
  Filled 2019-06-18: qty 20

## 2019-06-18 MED ORDER — DEXTROSE 50 % IV SOLN
INTRAVENOUS | Status: DC | PRN
  Administered 2019-06-18: 25 mL via INTRAVENOUS

## 2019-06-18 MED ORDER — POTASSIUM CHLORIDE CRYS ER 20 MEQ PO TBCR: 20.0000 meq | EXTENDED_RELEASE_TABLET | Freq: Once | ORAL | Status: DC | PRN

## 2019-06-18 MED ORDER — MORPHINE SULFATE (PF) 2 MG/ML IV SOLN: 2.0000 mg | INTRAVENOUS | Status: DC | PRN

## 2019-06-18 MED ORDER — MIDAZOLAM HCL 2 MG/2ML IJ SOLN
INTRAMUSCULAR | Status: AC
  Administered 2019-06-18: 2 mg via INTRAVENOUS
  Filled 2019-06-18: qty 2

## 2019-06-18 MED ORDER — ORAL CARE MOUTH RINSE
15.0000 mL | OROMUCOSAL | Status: DC
  Administered 2019-06-18: 15 mL via OROMUCOSAL

## 2019-06-18 MED ORDER — CHLORHEXIDINE GLUCONATE CLOTH 2 % EX PADS: 6.0000 | MEDICATED_PAD | Freq: Once | CUTANEOUS | Status: DC

## 2019-06-18 MED ORDER — CISATRACURIUM BOLUS VIA INFUSION: 10.0000 mg | Freq: Once | INTRAVENOUS | Status: DC

## 2019-06-18 MED ORDER — FENTANYL CITRATE (PF) 100 MCG/2ML IJ SOLN
50.0000 ug | INTRAMUSCULAR | Status: AC | PRN
  Administered 2019-06-19 (×3): 50 ug via INTRAVENOUS

## 2019-06-18 MED ORDER — ACETAMINOPHEN 325 MG RE SUPP: 325.0000 mg | RECTAL | Status: DC | PRN

## 2019-06-18 MED ORDER — SODIUM CHLORIDE 0.9 % IV SOLN: INTRAVENOUS | Status: DC

## 2019-06-18 MED ORDER — ALBUMIN HUMAN 5 % IV SOLN
INTRAVENOUS | Status: DC | PRN
  Administered 2019-06-18 (×3): via INTRAVENOUS

## 2019-06-18 MED ORDER — DEXMEDETOMIDINE HCL IN NACL 200 MCG/50ML IV SOLN
INTRAVENOUS | Status: AC
  Filled 2019-06-18: qty 50

## 2019-06-18 MED ORDER — HEPARIN SODIUM (PORCINE) 1000 UNIT/ML IJ SOLN
INTRAMUSCULAR | Status: DC | PRN
  Administered 2019-06-18: 10000 [IU] via INTRAVENOUS
  Administered 2019-06-18: 2000 [IU] via INTRAVENOUS

## 2019-06-18 MED ORDER — LABETALOL HCL 5 MG/ML IV SOLN: 10.0000 mg | INTRAVENOUS | Status: DC | PRN

## 2019-06-18 MED ORDER — PROPOFOL 10 MG/ML IV BOLUS
INTRAVENOUS | Status: AC
  Filled 2019-06-18: qty 20

## 2019-06-18 MED ORDER — NITROGLYCERIN 0.4 MG SL SUBL: 0.4000 mg | SUBLINGUAL_TABLET | SUBLINGUAL | Status: DC | PRN

## 2019-06-18 MED ORDER — MANNITOL 25 % IV SOLN
INTRAVENOUS | Status: DC | PRN
  Administered 2019-06-18: 25 g via INTRAVENOUS

## 2019-06-18 MED ORDER — LACTATED RINGERS IV SOLN
INTRAVENOUS | Status: DC
  Administered 2019-06-18 (×3): via INTRAVENOUS

## 2019-06-18 MED ORDER — CALCIUM CHLORIDE 10 % IV SOLN
INTRAVENOUS | Status: AC
  Filled 2019-06-18: qty 10

## 2019-06-18 MED ORDER — PHENOL 1.4 % MT LIQD: 1.0000 | OROMUCOSAL | Status: DC | PRN

## 2019-06-18 MED ORDER — ROCURONIUM BROMIDE 100 MG/10ML IV SOLN
INTRAVENOUS | Status: DC | PRN
  Administered 2019-06-18 (×2): 40 mg via INTRAVENOUS
  Administered 2019-06-18: 30 mg via INTRAVENOUS
  Administered 2019-06-18: 80 mg via INTRAVENOUS
  Administered 2019-06-18: 20 mg via INTRAVENOUS
  Administered 2019-06-18: 30 mg via INTRAVENOUS

## 2019-06-18 MED ORDER — PANTOPRAZOLE SODIUM 40 MG PO TBEC: 40.0000 mg | DELAYED_RELEASE_TABLET | Freq: Every day | ORAL | Status: DC

## 2019-06-18 MED ORDER — SODIUM CHLORIDE 0.9% FLUSH: 10.0000 mL | INTRAVENOUS | Status: DC | PRN

## 2019-06-18 MED ORDER — PHENYLEPHRINE 40 MCG/ML (10ML) SYRINGE FOR IV PUSH (FOR BLOOD PRESSURE SUPPORT)
PREFILLED_SYRINGE | INTRAVENOUS | Status: AC
  Filled 2019-06-18: qty 10

## 2019-06-18 MED ORDER — HYDRALAZINE HCL 20 MG/ML IJ SOLN
5.0000 mg | INTRAMUSCULAR | Status: DC | PRN
  Filled 2019-06-18: qty 1

## 2019-06-18 MED ORDER — CHLORHEXIDINE GLUCONATE 0.12% ORAL RINSE (MEDLINE KIT)
15.0000 mL | Freq: Two times a day (BID) | OROMUCOSAL | Status: DC
  Administered 2019-06-18: 15 mL via OROMUCOSAL

## 2019-06-18 MED ORDER — DEXAMETHASONE SODIUM PHOSPHATE 10 MG/ML IJ SOLN
INTRAMUSCULAR | Status: AC
  Filled 2019-06-18: qty 1

## 2019-06-18 MED ORDER — VASOPRESSIN 20 UNIT/ML IV SOLN
0.0400 [IU]/min | INTRAVENOUS | Status: DC
  Administered 2019-06-18 – 2019-06-21 (×6): 0.04 [IU]/min via INTRAVENOUS
  Filled 2019-06-18 (×7): qty 2

## 2019-06-18 MED ORDER — DEXTROSE-NACL 5-0.9 % IV SOLN: INTRAVENOUS | Status: DC

## 2019-06-18 MED ORDER — CALCIUM CHLORIDE 10 % IV SOLN
INTRAVENOUS | Status: DC | PRN
  Administered 2019-06-18 (×4): 250 mg via INTRAVENOUS

## 2019-06-18 MED ORDER — LACTATED RINGERS IV SOLN
INTRAVENOUS | Status: DC | PRN
  Administered 2019-06-18 (×3): via INTRAVENOUS

## 2019-06-18 MED ORDER — CLEVIDIPINE BUTYRATE 0.5 MG/ML IV EMUL
0.0000 mg/h | INTRAVENOUS | Status: DC
  Filled 2019-06-18: qty 50

## 2019-06-18 MED ORDER — CLEVIDIPINE BUTYRATE 0.5 MG/ML IV EMUL
INTRAVENOUS | Status: DC | PRN
  Administered 2019-06-18: 1 mg/h via INTRAVENOUS

## 2019-06-18 MED ORDER — SODIUM CHLORIDE 0.9 % IV SOLN
INTRAVENOUS | Status: DC | PRN
  Administered 2019-06-18: 500 mL

## 2019-06-18 MED ORDER — SODIUM CHLORIDE 0.9 % IV SOLN: 500.0000 mL | Freq: Once | INTRAVENOUS | Status: DC | PRN

## 2019-06-18 MED ORDER — SODIUM CHLORIDE 0.9 % IV SOLN
INTRAVENOUS | Status: DC | PRN
  Administered 2019-06-18: 25 ug/min via INTRAVENOUS

## 2019-06-18 MED ORDER — HEPARIN SODIUM (PORCINE) 1000 UNIT/ML IJ SOLN
INTRAMUSCULAR | Status: AC
  Filled 2019-06-18: qty 2

## 2019-06-18 MED ORDER — SODIUM CHLORIDE 0.9 % IV SOLN
INTRAVENOUS | Status: DC | PRN
  Administered 2019-06-18 (×2): via INTRAVENOUS

## 2019-06-18 MED ORDER — GUAIFENESIN-DM 100-10 MG/5ML PO SYRP: 15.0000 mL | ORAL_SOLUTION | ORAL | Status: DC | PRN

## 2019-06-18 MED ORDER — NOREPINEPHRINE 4 MG/250ML-% IV SOLN: 0.0000 ug/min | INTRAVENOUS | Status: DC

## 2019-06-18 MED ORDER — ORAL CARE MOUTH RINSE
15.0000 mL | OROMUCOSAL | Status: DC
  Administered 2019-06-18 – 2019-06-27 (×85): 15 mL via OROMUCOSAL

## 2019-06-18 MED ORDER — SODIUM CHLORIDE 0.9% FLUSH
10.0000 mL | Freq: Two times a day (BID) | INTRAVENOUS | Status: DC
  Administered 2019-06-18 – 2019-06-27 (×13): 10 mL

## 2019-06-18 MED ORDER — PANTOPRAZOLE SODIUM 40 MG IV SOLR
40.0000 mg | Freq: Every day | INTRAVENOUS | Status: DC
  Administered 2019-06-18 – 2019-06-26 (×9): 40 mg via INTRAVENOUS
  Filled 2019-06-18 (×9): qty 40

## 2019-06-18 MED ORDER — CEFAZOLIN SODIUM-DEXTROSE 2-4 GM/100ML-% IV SOLN
2.0000 g | Freq: Three times a day (TID) | INTRAVENOUS | Status: AC
  Administered 2019-06-18 – 2019-06-19 (×2): 2 g via INTRAVENOUS
  Filled 2019-06-18 (×2): qty 100

## 2019-06-18 MED ORDER — CISATRACURIUM BESYLATE (PF) 10 MG/5ML IV SOLN
10.0000 mg | Freq: Once | INTRAVENOUS | Status: AC
  Administered 2019-06-18: 10 mg via INTRAVENOUS
  Filled 2019-06-18 (×2): qty 5

## 2019-06-18 MED ORDER — SODIUM CHLORIDE 0.9 % IV SOLN
INTRAVENOUS | Status: AC
  Filled 2019-06-18: qty 1.2

## 2019-06-18 MED ORDER — MIDAZOLAM HCL 2 MG/2ML IJ SOLN
2.0000 mg | Freq: Once | INTRAMUSCULAR | Status: AC
  Administered 2019-06-18: 2 mg via INTRAVENOUS
  Filled 2019-06-18: qty 2

## 2019-06-18 MED ORDER — ALUM & MAG HYDROXIDE-SIMETH 200-200-20 MG/5ML PO SUSP: 15.0000 mL | ORAL | Status: DC | PRN

## 2019-06-18 MED ORDER — FENTANYL CITRATE (PF) 100 MCG/2ML IJ SOLN
50.0000 ug | Freq: Once | INTRAMUSCULAR | Status: AC
  Administered 2019-06-18: 09:00:00 50 ug via INTRAVENOUS

## 2019-06-18 MED ORDER — PHENYLEPHRINE 40 MCG/ML (10ML) SYRINGE FOR IV PUSH (FOR BLOOD PRESSURE SUPPORT)
PREFILLED_SYRINGE | INTRAVENOUS | Status: DC | PRN
  Administered 2019-06-18: 120 ug via INTRAVENOUS

## 2019-06-18 MED ORDER — ACETAMINOPHEN 325 MG PO TABS
325.0000 mg | ORAL_TABLET | ORAL | Status: DC | PRN
  Administered 2019-06-20: 650 mg via ORAL
  Filled 2019-06-18: qty 2

## 2019-06-18 MED ORDER — MIDAZOLAM 50MG/50ML (1MG/ML) PREMIX INFUSION
2.0000 mg/h | INTRAVENOUS | Status: DC
  Administered 2019-06-18 – 2019-06-20 (×4): 2 mg/h via INTRAVENOUS
  Filled 2019-06-18 (×3): qty 50

## 2019-06-18 MED ORDER — 0.9 % SODIUM CHLORIDE (POUR BTL) OPTIME
TOPICAL | Status: DC | PRN
  Administered 2019-06-18: 15:00:00 1000 mL
  Administered 2019-06-18: 11:00:00 2000 mL

## 2019-06-18 MED ORDER — METOPROLOL TARTRATE 5 MG/5ML IV SOLN
2.5000 mg | Freq: Four times a day (QID) | INTRAVENOUS | Status: DC
  Administered 2019-06-18: 2.5 mg via INTRAVENOUS
  Filled 2019-06-18 (×2): qty 5

## 2019-06-18 MED ORDER — LACTATED RINGERS IV BOLUS
1000.0000 mL | Freq: Once | INTRAVENOUS | Status: AC
  Administered 2019-06-18: 1000 mL via INTRAVENOUS

## 2019-06-18 SURGICAL SUPPLY — 65 items
CANISTER SUCT 3000ML PPV (MISCELLANEOUS) ×3 IMPLANT
CLIP VESOCCLUDE MED 24/CT (CLIP) ×6 IMPLANT
CLIP VESOCCLUDE SM WIDE 24/CT (CLIP) ×3 IMPLANT
COVER SURGICAL LIGHT HANDLE (MISCELLANEOUS) ×3 IMPLANT
COVER WAND RF STERILE (DRAPES) IMPLANT
DERMABOND ADHESIVE PROPEN (GAUZE/BANDAGES/DRESSINGS) ×2
DERMABOND ADVANCED (GAUZE/BANDAGES/DRESSINGS) ×8
DERMABOND ADVANCED .7 DNX12 (GAUZE/BANDAGES/DRESSINGS) ×4 IMPLANT
DERMABOND ADVANCED .7 DNX6 (GAUZE/BANDAGES/DRESSINGS) ×1 IMPLANT
DRSG COVADERM 4X14 (GAUZE/BANDAGES/DRESSINGS) ×3 IMPLANT
DRSG COVADERM 4X6 (GAUZE/BANDAGES/DRESSINGS) ×3 IMPLANT
ELECT BLADE 4.0 EZ CLEAN MEGAD (MISCELLANEOUS) ×3
ELECT BLADE 6.5 EXT (BLADE) IMPLANT
ELECT REM PT RETURN 9FT ADLT (ELECTROSURGICAL) ×3
ELECTRODE BLDE 4.0 EZ CLN MEGD (MISCELLANEOUS) ×1 IMPLANT
ELECTRODE REM PT RTRN 9FT ADLT (ELECTROSURGICAL) ×1 IMPLANT
FELT TEFLON 1X6 (MISCELLANEOUS) ×6 IMPLANT
GLOVE BIO SURGEON STRL SZ 6.5 (GLOVE) ×4 IMPLANT
GLOVE BIO SURGEON STRL SZ7.5 (GLOVE) ×6 IMPLANT
GLOVE BIO SURGEONS STRL SZ 6.5 (GLOVE) ×2
GLOVE BIOGEL PI IND STRL 6.5 (GLOVE) ×2 IMPLANT
GLOVE BIOGEL PI IND STRL 7.0 (GLOVE) ×2 IMPLANT
GLOVE BIOGEL PI IND STRL 7.5 (GLOVE) ×5 IMPLANT
GLOVE BIOGEL PI IND STRL 8 (GLOVE) ×1 IMPLANT
GLOVE BIOGEL PI INDICATOR 6.5 (GLOVE) ×4
GLOVE BIOGEL PI INDICATOR 7.0 (GLOVE) ×4
GLOVE BIOGEL PI INDICATOR 7.5 (GLOVE) ×10
GLOVE BIOGEL PI INDICATOR 8 (GLOVE) ×2
GLOVE SURG SS PI 6.5 STRL IVOR (GLOVE) ×3 IMPLANT
GLOVE SURG SS PI 7.5 STRL IVOR (GLOVE) ×3 IMPLANT
GOWN STRL NON-REIN LRG LVL3 (GOWN DISPOSABLE) ×3 IMPLANT
GOWN STRL REUS W/ TWL LRG LVL3 (GOWN DISPOSABLE) ×5 IMPLANT
GOWN STRL REUS W/ TWL XL LVL3 (GOWN DISPOSABLE) ×2 IMPLANT
GOWN STRL REUS W/TWL LRG LVL3 (GOWN DISPOSABLE) ×10
GOWN STRL REUS W/TWL XL LVL3 (GOWN DISPOSABLE) ×4
GRAFT HEMASHIELD 18X9MM (Vascular Products) ×3 IMPLANT
HEMOSTAT SNOW SURGICEL 2X4 (HEMOSTASIS) ×12 IMPLANT
HEMOSTAT SPONGE AVITENE ULTRA (HEMOSTASIS) IMPLANT
INSERT FOGARTY 61MM (MISCELLANEOUS) ×9 IMPLANT
INSERT FOGARTY SM (MISCELLANEOUS) ×21 IMPLANT
KIT BASIN OR (CUSTOM PROCEDURE TRAY) ×3 IMPLANT
KIT TURNOVER KIT B (KITS) ×3 IMPLANT
NS IRRIG 1000ML POUR BTL (IV SOLUTION) ×6 IMPLANT
PACK AORTA (CUSTOM PROCEDURE TRAY) ×3 IMPLANT
PAD ARMBOARD 7.5X6 YLW CONV (MISCELLANEOUS) ×6 IMPLANT
RETAINER VISCERA MED (MISCELLANEOUS) ×3 IMPLANT
SPONGE LAP 18X18 X RAY DECT (DISPOSABLE) ×6 IMPLANT
STAPLER VISISTAT 35W (STAPLE) ×6 IMPLANT
SUT ETHIBOND 5 LR DA (SUTURE) IMPLANT
SUT MNCRL AB 4-0 PS2 18 (SUTURE) ×6 IMPLANT
SUT PDS AB 1 TP1 96 (SUTURE) ×6 IMPLANT
SUT PROLENE 3 0 SH1 36 (SUTURE) ×12 IMPLANT
SUT PROLENE 5 0 C 1 24 (SUTURE) ×18 IMPLANT
SUT PROLENE 5 0 C 1 36 (SUTURE) ×15 IMPLANT
SUT PROLENE 6 0 C 1 24 (SUTURE) ×3 IMPLANT
SUT PROLENE 6 0 C 1 30 (SUTURE) ×3 IMPLANT
SUT PROLENE 6 0 CC (SUTURE) ×3 IMPLANT
SUT SILK 2 0 SH CR/8 (SUTURE) ×6 IMPLANT
SUT VIC AB 2-0 CT1 36 (SUTURE) ×6 IMPLANT
SUT VIC AB 3-0 SH 27 (SUTURE) ×10
SUT VIC AB 3-0 SH 27X BRD (SUTURE) ×5 IMPLANT
TOWEL GREEN STERILE (TOWEL DISPOSABLE) ×6 IMPLANT
TOWEL SURG RFD BLUE STRL DISP (DISPOSABLE) ×9 IMPLANT
TRAY FOLEY MTR SLVR 16FR STAT (SET/KITS/TRAYS/PACK) ×3 IMPLANT
WATER STERILE IRR 1000ML POUR (IV SOLUTION) ×6 IMPLANT

## 2019-06-18 NOTE — Op Note (Addendum)
Date:  June 18, 2019  Preoperative diagnosis: 1.  7.4 cm infrarenal abdominal aortic aneurysm 2.  5.0 cm right common iliac artery aneurysm 3.  2.3 cm left common iliac artery aneurysm  Postoperative diagnosis: Same  Procedure: Open abdominal aortic aneurysm and right common iliac artery aneurysm repair with 18 mm x 9 mm bifurcated Dacron graft  Surgeon: Dr. [name], MD  Assistant: Dr. Annamarie Major, MD and Leontine Locket, PA  Indication: Patient is a 63 year old male who was initially seen in clinic after incidental discovery of abdominal aortic aneurysm on CT chest.  This was further imaged with a CT angio abdomen and pelvis that revealed 7.4 cm infrarenal abdominal aortic aneurysm with a large 5 cm right common iliac artery aneurysm.  He was not a candidate for endovascular repair with stent graft given short angulated neck.  He presents today after risks benefits were discussed with plans for open repair.  We did get cardiac clearance prior to surgery and had him hold his Plavix before surgery as well.  Findings: Large 7+ cm abdominal aortic aneurysm with very short neck required ligation of the left renal vein in order to get adequate exposure for aortic clamp placement on the neck of the aneurysm.  Also had a very large greater than 5 cm right common iliac artery aneurysm.  Ultimately this was repaired with a infrarenal clamp and a bifurcated 18 mm x 9 mm graft sewn from the infrarenal aorta to both common iliac arteries with exclusion of the right common iliac artery aneurysm.  The IMA had no backbleeding and was ligated.    Anesthesia: General  EBL: 6600 mL  Resuscitation: 5400 mL crystalloid, 750 mL of albumin, 2600 mL Cell Saver, 2 units packed red blood cells, 2 units FFP  Details: Patient was taken to the operating room after informed consent was obtained.  He was placed on the operating table in supine position.  General endotracheal anesthesia was induced.   Patient did receive preoperative antibiotics.  A prep timeout was performed to identify patient, procedure, and site.  Initially made midline incision from the xiphoid to just above the pubis dissected with Bovie cautery and ultimately opened the midline abdominal fascia with Bovie cautery.  Peritoneal cavity was entered bluntly.  Ultimately on initial exploration there were no obvious masses or other abnormal findings after exploring all four quadrants as well as the small bowel, colon, liver, etc.  Patient had large aneurysm palpable in the retroperitoneum.  That point in time I then eviscerated small bowel to the right upper quadrant and opened the the retroperitoneum between the duodenum and the IMV.  Ultimately the retroperitoneum was then opened with Bovie cautery and blunt dissection.  Patient had very large aneurysm and ultimately we had to pull down on the aneurysm to get adequate exposure of the aortic neck.  I did ligate the IMV between two 2-0 silk ties and medium clips and divided this.  Upon further exposure of the aneurysm neck it became obvious that the renal vein had to be ligated.  This was ligated between 2-0 silk ties as well as medium vessel clips and divided.  Ultimately the neck of the aneurysm was then bluntly dissected fingertip as well as bluntly with suction.  Both the left and right renal arteries were identified.  There appeared to be a nice clamp site below both renal arteries.  During this process did appear to be brisk venous bleeding from gonadal vein right where it was  coming off the left renal vein.  This had to be repaired with multiple 5-0 Prolene's.  Ultimately the retroperitoneum was opened all the way down over the right iliac artery aneurysm that we could expose the external iliac and hypogastric artery.  The iliac vein was dissected away from underneath this and Vesseloops were placed on the right external iliac as well as the right hypogastric artery.  Should be noted this  was a very difficult dissection given the size of his infrarenal abdominal aneurysm and the size of his right common iliac aneurysm.  The IMA was also dissected out and controlled with vessel loops. Subsequently carried our dissection over top of the left iliac artery and the left common iliac artery was controlled with large vessel loop.  That point in time the patient was given 100 units/kg heparin and ACT's were checked throughout case to maintain >250.  I placed Henley clamps on the left common iliac artery distally and Henley clamp on the right external iliac artery and controlled the right hypogastric artery with vessel loop.  Ultimately used a Harken C aortic clamp on the infrarenal aorta and placed umbilical tape behind the infrarenal aortic neck to get adequate exposure for clamp placement.  After the Harken C was applied below the renal arteries, the abdominal aneurysm was opened with Bovie cautery and I used large Metzenbaum scissors and opened aneurysm in its entirety onto the right common iliac artery aneurysm.  All backbleeding lumbar branches were then ligated with 2-0 silk ties.  There were at least two sets of lumbar arteries that were identified and ligated.  All the infrarenal and right common iliac thrombus was subsequently removed. The infrarenal neck of the aneurysm was beveled with scissors.  I then brought a 18 mm by 9 mm bifurcated dacron graft in the field.  The main body of the graft was trimmed.  I then subsequently sewed an end to end anastomosis to the infra-renal aorta with 3-0 Prolene in parachute format.  Once I came off clamps there was only one small leak that had to be repaired with a pledget on the right lateral wall.  The rest of the proximal anastomosis looked hemostatic.  Initially turned my attention to the left common iliac artery and again this was beveled and cut for end to end anastomosis.  The left limb of the endograft was pulled to the appropriate length and trimmed.   I flushed the left limb of the graft with excellent inflow.  This was flushed with heparinized saline and reclamped with a straight clamp.  I then sewed it end-to-end with a 5-0 Prolene to the left common iliac artery in parachute format.  Everything was flushed prior to completion of anastomosis.  Patient had an excellent left femoral pulse distal to the repair.  I did have to place one pledgeted suture on the posterior wall.  I then turned my attention to the right common iliac artery aneurysm.  I completely finished opening the aneurysm all the way down to the bifurcation of the hypogastric and external iliac.  Again the artery was beveled here for an end-to-end anastomosis.  I performed a limited endarterectomy here.  Ultimately the graft was pulled to the appropriate length and again cut the graft to length and then a end-to-end anastomosis was sewn to the right common iliac artery at the takeoff of the hypogastric with a 5-0 Prolene in parachute format.  Again everything was de-aired prior to completion.  Patient an excellent right  femoral pulse at completion.  I had the OR staff check the feet patient had brisk Doppler signals bilaterally.  Patient was given 50 mg protamine for reversal.  We spent a fair amount of time getting hemostasis.  I used Surgicel snow at both proximal and distal anastomosis.  The aneurysm sac was closed over the graft with a running 2-0 Vicryl.  Prior to this I examined the IMA with no backbleeding and I ligated it anyway with a 2-0 silk tie.  Ultimately the retroperitoneum was run closed with a 2-0 Vicryl once we were happy with hemostasis.  I did have to put an additional 5-0 Prolene in the gonadal vein on the left that had some ongoing venous bleeding near renal vein.  There was also one area in the infrarenal neck of the aorta just above her anastomosis that was repaired with a 5-0 Prolene.  I did not see any other active bleeding.  There appeared to be good hemostasis with  proximal and at both distal anastomosis.  The retroperituemm was run closed with 2-0 Vicryl.  There was one area of small bowel in the distal ileum where there appeared to be some hematoma and a tear in the mesentery.  This was likely from extensive retraction given the size of the right common iliac aneurysm to get exposure.  I did have one my partners take a look at this with me and we both felt that this was viable and did not need to be resected.  I did repair the hole in the mesentery with a running 3-0 Vicryl.  He had an excellent pulse in the SMA.  At that point in time peritoneum was irrigated with saline until effluent was clear.  All bowel appeared viable.  NG was in the stomach.  I ran the midline fascia closed with #1 double-stranded PDS.  Subcutaneous tissue was closed with a 3-0 Vicryl running.  4-0 Monocryl was used to close the skin.  Dermabond was applied.  He will remain intubated given his resuscitation and taken to the ICU.  I have talked with ICU team for critical care support.  Complication: None  Condition: Critical, transported to ICU intubated  [name], MD [facility] Office: [phone] Pager: [place]

## 2019-06-18 NOTE — Consult Note (Signed)
NAME:  Lawrence Wallace, MRN:  [id number], DOB:  1955-11-30, LOS: 0 ADMISSION DATE:  06/16/2019, CONSULTATION DATE:  06/10/2019 REFERRING MD:  Lawrence Wallace  CHIEF COMPLAINT:  Vent Management   Brief History   Lawrence Wallace is a 63 y.o. male who was admitted 9/30 for repair of large AAA.  He had significant intraoperative blood loss with subsequent hypotension requiring 5L IVF's, 2u PRBC, 2 FFP, 2.5L from cellsaver, and neosynephrine.  Upon leaving PACU, he was somewhat hypertensive and was started on cleviprex.  History of present illness   Pt is encephelopathic; therefore, this HPI is obtained from chart review. Lawrence Wallace is a 63 y.o. male who has a PMH including but not limited to tobacco, abuse, AAA, NSTEMI, HTN, HLD, CAD, skin CA, GERD.  He presented to [facility] 9/30 for repair of 7cm AAA and 5cm R common iliac artery aneurysm.  He had significant blood loss intraoperatively and required 5L IVF's, 2u PRBC, 2 FFP, 2.5L cell saver, and neosynephrine.  Just prior to leaving PACU, he was noted to be hypertensive and was started on cleviprex.  Neosynephrine was discontinued upon arrival to ICU and cleviprex is being titrated down.  Past Medical History  tobacco, abuse, AAA, NSTEMI, HTN, HLD, CAD, skin CA, GERD.  Significant Hospital Events   9/30 > admitted for OR repair of 7cm AAA and 5cm R common iliac artery aneurysm.  Consults:  PCCM.  Procedures:  ETT 9/30 >  L rad art line 9/30 >  R swan 9/30 >   Significant Diagnostic Tests:  None.  Micro Data:  None.  Antimicrobials:  None.   Interim history/subjective:  Sedated, comfortable.  SBP 140's, on 2mg /hr cleviprex.  Neo off.  Objective:  Blood pressure (!) 106/55, pulse (!) 113, temperature 97.9 F (36.6 C), temperature source Oral, resp. rate 12, height 6\' 2"  (1.88 m), weight 92.9 kg, SpO2 96 %.    Vent Mode: PRVC FiO2 (%):  [50 %] 50 % Set Rate:  [12 bmp-14 bmp] 14 bmp Vt Set:  [650 mL] 650 mL PEEP:  [5 cmH20] 5 cmH20 Plateau  Pressure:  [15 cmH20] 15 cmH20   Intake/Output Summary (Last 24 hours) at 06/16/2019 1704 Last data filed at 06/15/2019 1600 Gross per 24 hour  Intake 10004 ml  Output 7050 ml  Net 2954 ml   Filed Weights   06/06/2019 0828  Weight: 92.9 kg    Examination: General: Adult male, critically ill, in NAD. Neuro: Sedated, not responsive. HEENT: [place]/AT. Sclerae anicteric.  ETT in place. Cardiovascular: RRR, no M/R/G.  Lungs: Respirations even and unlabored.  CTA bilaterally, No W/R/R.  Abdomen: Midline abdominal dressing C/D/I.  BS x 4, soft, NT/ND.  Musculoskeletal: No gross deformities, no edema.  Skin: Lower abdomen and bilateral LE's mottled.  Skin otherwise warm, no rashes.  Assessment & Plan:   Chronic Aortic Abdominal Aneurysm s/p open repair  -7.4cm infrarenal aneurysm, EBL 6 units with 2.5 units returned via cell-saver  -Was hypotensive peri-operatively and started on Neo, but then hypertensive post-op and initiated on Cleviprex P: -post op plan per vascular -continue cleviprex and wean to off, goal maintain normotension (SBP 100-140) -monitor h/h and transfuse as needed   Acute Respiratory Insufficiency secondary to above  -At risk for TRALI/TACO P: -Full Vent support PRVC, titrate FiO2 >92% -VAP bundle  -Precedex and Fentanyl for Rass goal -1 -repeat stat labs now -SBT in AM for hopeful extubation  Hx HTN, CAD, NSTEMI P: -hold home lopressor, plavix,  Asa and statin  Rest per primary team.  Best Practice:  Diet: NPO. Pain/Anxiety/Delirium protocol (if indicated): Precedex gtt / fentanyl PRN.  RASS goal -1. VAP protocol (if indicated): In place. DVT prophylaxis: SCD's. GI prophylaxis: PPI. Glucose control: SSI if glucose consistently > 180. Mobility: Bedrest. Code Status: Full. Family Communication: None available. Disposition: ICU.  Labs   CBC: Recent Labs  Lab 06/16/19 0858 05/28/2019 1036 06/05/2019 1223 06/05/2019 1310 06/17/2019 1426 05/30/2019 1614   WBC 8.2  --   --   --   --   --   HGB 17.1* 15.6 14.3 12.9* 14.6 14.3  HCT 52.5* 46.0 42.0 38.0* 43.0 42.0  MCV 94.9  --   --   --   --   --   PLT 203  --   --   --   --   --    Basic Metabolic Panel: Recent Labs  Lab 06/16/19 0858 06/03/2019 1036 05/25/2019 1223 06/13/2019 1310 05/29/2019 1426 05/20/2019 1614  NA 136 139 137 139 138 139  K 4.8 4.4 4.6 4.7 5.8* 6.0*  CL 103  --   --   --   --   --   CO2 21*  --   --   --   --   --   GLUCOSE 107*  --   --   --   --   --   BUN 10  --   --   --   --   --   CREATININE 0.80  --   --   --   --   --   CALCIUM 9.5  --   --   --   --   --    GFR: Estimated Creatinine Clearance: 107 mL/min (by C-G formula based on SCr of 0.8 mg/dL). Recent Labs  Lab 06/16/19 0858  WBC 8.2   Liver Function Tests: Recent Labs  Lab 06/16/19 0858  AST 28  ALT 24  ALKPHOS 60  BILITOT 1.2  PROT 6.9  ALBUMIN 3.8   No results for input(s): LIPASE, AMYLASE in the last 168 hours. No results for input(s): AMMONIA in the last 168 hours. ABG    Component Value Date/Time   PHART 7.226 (L) 06/04/2019 1614   PCO2ART 59.2 (H) 05/20/2019 1614   PO2ART 185.0 (H) 05/31/2019 1614   HCO3 24.6 05/26/2019 1614   TCO2 26 05/23/2019 1614   ACIDBASEDEF 4.0 (H) 05/20/2019 1614   O2SAT 99.0 05/26/2019 1614    Coagulation Profile: Recent Labs  Lab 06/16/19 0858  INR 1.0   Cardiac Enzymes: No results for input(s): CKTOTAL, CKMB, CKMBINDEX, TROPONINI in the last 168 hours. HbA1C: Hgb A1c MFr Bld  Date/Time Value Ref Range Status  12/10/2017 09:20 AM 5.9 4.6 - 6.5 % Final    Comment:    Glycemic Control Guidelines for People with Diabetes:Non Diabetic:  <6%Goal of Therapy: <7%Additional Action Suggested:  >8%   06/29/2016 08:11 AM 5.7 4.6 - 6.5 % Final    Comment:    Glycemic Control Guidelines for People with Diabetes:Non Diabetic:  <6%Goal of Therapy: <7%Additional Action Suggested:  >8%    CBG: No results for input(s): GLUCAP in the last 168 hours.  Review  of Systems:   Unable to obtain as pt is encephalopathic.  Past medical history  He,  has a past medical history of Asthma, CAD in native artery, Cancer ([place]), Coronary artery disease, GERD (gastroesophageal reflux disease), Hyperlipidemia LDL goal <70 (01/17/2019), Hypertension (01/17/2019), NSTEMI (  non-ST elevated myocardial infarction) ([place]) (01/15/2019), and Tobacco abuse.   Surgical History    Past Surgical History:  Procedure Laterality Date  . CORONARY STENT INTERVENTION N/A 01/16/2019   Procedure: CORONARY STENT INTERVENTION;  Surgeon: Burnell Blanks, MD;  Location: [facility];  Service: Cardiovascular;  Laterality: N/A;  . EYE SURGERY Left 1983   glass remmoved  . FOOT FRACTURE SURGERY Right   . LEFT HEART CATH AND CORONARY ANGIOGRAPHY N/A 01/16/2019   Procedure: LEFT HEART CATH AND CORONARY ANGIOGRAPHY;  Surgeon: Burnell Blanks, MD;  Location: [facility];  Service: Cardiovascular;  Laterality: N/A;     Social History   reports that he has been smoking cigarettes. He has a 33.75 pack-year smoking history. He has never used smokeless tobacco. He reports current alcohol use of about 6.0 standard drinks of alcohol per week. He reports that he does not use drugs.   Family history   His family history includes Cancer in his father and mother; Stroke in his mother.   Allergies No Known Allergies   Home meds  Prior to Admission medications   Medication Sig Start Date End Date Taking? Authorizing Provider  aspirin EC 81 MG tablet Take 1 tablet (81 mg total) by mouth daily. 06/11/19  Yes Weaver, Scott T, PA-C  metoprolol tartrate (LOPRESSOR) 25 MG tablet Take 1 tablet (25 mg total) by mouth 2 (two) times daily. 05/22/19  Yes Sherren Mocha, MD  Multiple Vitamin (MULTIVITAMIN) tablet Take 1 tablet by mouth daily.   Yes [provider]  nitroGLYCERIN (NITROSTAT) 0.4 MG SL tablet Place 1 tablet (0.4 mg total) under the tongue every 5 (five) minutes x 3  doses as needed for chest pain. 05/22/19  Yes Sherren Mocha, MD  rosuvastatin (CRESTOR) 40 MG tablet Take 1 tablet (40 mg total) by mouth daily. Patient taking differently: Take 40 mg by mouth daily at 6 PM.  05/22/19  Yes Sherren Mocha, MD  varenicline (CHANTIX STARTING MONTH PAK) 0.5 MG X 11 & 1 MG X 42 tablet Take one 0.5 mg tablet by mouth once daily for 3 days, then increase to one 0.5 mg tablet twice daily for 4 days, then increase to one 1 mg tablet twice daily. Patient taking differently: Take 1 mg by mouth 2 (two) times daily. Not taking 05/08/19  Yes Pleas Koch, NP  clopidogrel (PLAVIX) 75 MG tablet Take 1 tablet (75 mg total) by mouth daily. 05/30/19   Leanor Kail, PA    Critical care time: 45 min.    Montey Hora, [facility] Pager: [phone].  If no answer, [phone] 06/15/2019, 5:04 PM

## 2019-06-18 NOTE — Anesthesia Procedure Notes (Signed)
Central Venous Catheter Insertion Performed by: Catalina Gravel, MD, anesthesiologist Start/End09/24/2020 9:05 AM, 06/18/2019 9:15 AM Patient location: Pre-op. Preanesthetic checklist: patient identified, IV checked, site marked, risks and benefits discussed, surgical consent, monitors and equipment checked, pre-op evaluation, timeout performed and anesthesia consent Position: Trendelenburg Lidocaine 1% used for infiltration and patient sedated Hand hygiene performed , maximum sterile barriers used  and Seldinger technique used Catheter size: 8.5 Fr Central line was placed.Sheath introducer Procedure performed using ultrasound guided technique. Ultrasound Notes:anatomy identified, needle tip was noted to be adjacent to the nerve/plexus identified, no ultrasound evidence of intravascular and/or intraneural injection and image(s) printed for medical record Attempts: 1 Following insertion, line sutured, dressing applied and Biopatch. Post procedure assessment: free fluid flow, blood return through all ports and no air  Patient tolerated the procedure well with no immediate complications.

## 2019-06-18 NOTE — Progress Notes (Signed)
eLink Physician-Brief Progress Note Patient Name: Lawrence Wallace DOB: 1956/07/27 MRN: [id number]   Date of Service  06/13/2019  HPI/Events of Note  Ventilator dysynchrony and hypotension on maximum dose Precedex.  eICU Interventions  Precedex discontinued, Versed 2 mg iv then versed infusion at 2 mg/ hour, Nimbex 10 mg iv once while on versed to try to re-establish ventilator synchrony.        [name] 05/29/2019, 10:25 PM

## 2019-06-18 NOTE — H&P (Signed)
History and Physical Interval Note:  06/12/2019 9:01 AM  Lawrence Wallace  has presented today for surgery, with the diagnosis of abdominal aortic aneurysm.  The various methods of treatment have been discussed with the patient and family. After consideration of risks, benefits and other options for treatment, the patient has consented to  Procedure(s): ANEURYSM ABDOMINAL AORTIC REPAIR (N/A) as a surgical intervention.  The patient's history has been reviewed, patient examined, no change in status, stable for surgery.  I have reviewed the patient's chart and labs.  Questions were answered to the patient's satisfaction.    Plan for open abdominal aortic aneurysm repair and right common iliac aneurysm repair with bifurcated graft.  Patient has a 7 cm abdominal aortic aneurysm and a 5 cm right common iliac artery aneurysm.  He is not a candidate for endovascular repair given short angulated neck.  I discussed risk and benefits with him including 30%+ risk for major complication including cardiac event, stroke, bleeding requiring return to the OR, renal failure requiring dialysis, infection, and or even death.  Lawrence Wallace  Patient name: Lawrence Wallace MRN: [id number]        DOB: 26-Oct-1955          Sex: male  REASON FOR CONSULT: F/U for AAA after CTA for further evaluation  HPI: Lawrence Wallace is a 63 y.o. male, with history of coronary artery disease s/p NSTEMI with dug-eluting stent in 12/2018 in the circumflex, hypertension, hyperlipidemia that presents for follow-up of abdominal aortic aneurysm after CTA.    As previously noted, he was having hemoptysis earlier this year and underwent a CT chest by his PCP.  Ultimately on this scan got partial imaging of a 5.4 cm abdominal aortic aneurysm.  Patient reports no prior knowledge of the aneurysm.    He is on Plavix following his NSTEMI with coronary stent placed in April of this year by Dr. Burt Knack.  As far as belly surgery goes he states he did have  a cyst removed from his upper abdomen in the past.  On follow-up today after CT scan no additional abdominal or back pain.        Past Medical History:  Diagnosis Date  . Asthma   . CAD in native artery    a. cath 12/2018 - total occlusion of the mid Circumflex artery s/p successful PTCA/DES x 1. Moderate non-obstructive disease in the mid RCA and mid LAD.  [facility] (HCC)    Skin  . GERD (gastroesophageal reflux disease)   . Hyperlipidemia LDL goal <70 01/17/2019  . Hypertension 01/17/2019  . NSTEMI (non-ST elevated myocardial infarction) ([place]) 01/15/2019  . Tobacco abuse          Past Surgical History:  Procedure Laterality Date  . CORONARY STENT INTERVENTION N/A 01/16/2019   Procedure: CORONARY STENT INTERVENTION;  Surgeon: Burnell Blanks, MD;  Location: [facility];  Service: Cardiovascular;  Laterality: N/A;  . FOOT FRACTURE SURGERY    . LEFT HEART CATH AND CORONARY ANGIOGRAPHY N/A 01/16/2019   Procedure: LEFT HEART CATH AND CORONARY ANGIOGRAPHY;  Surgeon: Burnell Blanks, MD;  Location: [facility];  Service: Cardiovascular;  Laterality: N/A;         Family History  Problem Relation Age of Onset  . Stroke Mother   . Cancer Mother   . Cancer Father     SOCIAL HISTORY: Social History        Socioeconomic History  . Marital status:  Married    Spouse name: Not on file  . Number of children: Not on file  . Years of education: Not on file  . Highest education level: Not on file  Occupational History  . Not on file  Social Needs  . Financial resource strain: Not on file  . Food insecurity    Worry: Not on file    Inability: Not on file  . Transportation needs    Medical: Not on file    Non-medical: Not on file  Tobacco Use  . Smoking status: Current Every Day Smoker    Packs/day: 1.50    Years: 45.00    Pack years: 67.50    Types: Cigarettes  . Smokeless tobacco: Never Used  Substance and Sexual  Activity  . Alcohol use: Yes    Alcohol/week: 1.0 standard drinks    Types: 1 Shots of liquor per week    Comment: 1 mixed liquor every night  . Drug use: Not on file  . Sexual activity: Not on file  Lifestyle  . Physical activity    Days per week: Not on file    Minutes per session: Not on file  . Stress: Not on file  Relationships  . Social Herbalist on phone: Not on file    Gets together: Not on file    Attends religious service: Not on file    Active member of club or organization: Not on file    Attends meetings of clubs or organizations: Not on file    Relationship status: Not on file  . Intimate partner violence    Fear of current or ex partner: Not on file    Emotionally abused: Not on file    Physically abused: Not on file    Forced sexual activity: Not on file  Other Topics Concern  . Not on file  Social History Narrative  . Not on file    No Known Allergies        Current Outpatient Medications  Medication Sig Dispense Refill  . clopidogrel (PLAVIX) 75 MG tablet Take 1 tablet (75 mg total) by mouth daily. 90 tablet 3  . metoprolol tartrate (LOPRESSOR) 25 MG tablet Take 1 tablet (25 mg total) by mouth 2 (two) times daily. 180 tablet 2  . Multiple Vitamin (MULTIVITAMIN) tablet Take 1 tablet by mouth daily.    . nitroGLYCERIN (NITROSTAT) 0.4 MG SL tablet Place 1 tablet (0.4 mg total) under the tongue every 5 (five) minutes x 3 doses as needed for chest pain. 75 tablet 1  . rosuvastatin (CRESTOR) 40 MG tablet Take 1 tablet (40 mg total) by mouth daily. 90 tablet 2  . varenicline (CHANTIX STARTING MONTH PAK) 0.5 MG X 11 & 1 MG X 42 tablet Take one 0.5 mg tablet by mouth once daily for 3 days, then increase to one 0.5 mg tablet twice daily for 4 days, then increase to one 1 mg tablet twice daily. 53 tablet 0  . aspirin EC 81 MG EC tablet Take 1 tablet (81 mg total) by mouth daily. (Patient not taking: Reported on 06/10/2019)  90 tablet 3   No current facility-administered medications for this visit.     REVIEW OF SYSTEMS:  [X]  denotes positive finding, [ ]  denotes negative finding Cardiac  Comments:  Chest pain or chest pressure:    Shortness of breath upon exertion:    Short of breath when lying flat:    Irregular heart rhythm:  Vascular    Pain in calf, thigh, or hip brought on by ambulation:    Pain in feet at night that wakes you up from your sleep:     Blood clot in your veins:    Leg swelling:         Pulmonary    Oxygen at home:    Productive cough:     Wheezing:         Neurologic    Sudden weakness in arms or legs:     Sudden numbness in arms or legs:     Sudden onset of difficulty speaking or slurred speech:    Temporary loss of vision in one eye:     Problems with dizziness:         Gastrointestinal    Blood in stool:     Vomited blood:         Genitourinary    Burning when urinating:     Blood in urine:        Psychiatric    Major depression:         Hematologic    Bleeding problems:    Problems with blood clotting too easily:        Skin    Rashes or ulcers:        Constitutional    Fever or chills:      PHYSICAL EXAM:    Vitals:   06/10/19 1104  BP: 123/83  Pulse: 76  Resp: 18  Temp: (!) 96.6 F (35.9 C)  TempSrc: Temporal  SpO2: 97%  Weight: 206 lb (93.4 kg)  Height: 6\' 2"  (1.88 m)    GENERAL: The patient is a well-nourished male, in no acute distress. The vital signs are documented above. CARDIAC: There is a regular rate and rhythm.  VASCULAR:  2+ palpable femoral pulses bilaterally 2+ palpable DP/PT pulses bilaterally PULMONARY: There is good air exchange bilaterally without wheezing or rales. ABDOMEN: Soft and non-tender.  No rebound or guarding. MUSCULOSKELETAL: There are no major deformities or cyanosis. NEUROLOGIC: No focal weakness or  paresthesias are detected. SKIN: There are no ulcers or rashes noted. PSYCHIATRIC: The patient has a normal affect.  DATA:   I independently reviewed the new CTA abdomen pelvis and patient has approximate 7 cm abdominal aortic aneurysm with a 5 cm right common iliac aneurysm.  Assessment/Plan:  63 year old male who presents for further follow-up after CTA to completely evaluate suspected 5.4 cm AAA.  Discussed after review of complete imaging he has a 7 cm abdominal aortic aneurysm and a 5 cm right common iliac aneurysm.  We have much more complete and detailed imaging of his aneurysm after the CTA.  Discussed in detail that he is not a candidate for endovascular repair given the short neck and angulation of his aneurysm.  He is going to require open repair and discussed using a bifurcated graft sewn to both iliacs.  I discussed in detail greater than 30% risk of major complication including bleeding requiring return to the OR, heart attack, risk of anesthesia, renal failure, graft infection, even death.  He is at exceedingly high risk for rupture without intervention given the size of both his abdominal aortic aneurysm and his right common iliac aneurysm.  We have scheduled him for surgery next Wednesday pending clearance from cardiology.  Of note he is on Plavix for his drug-eluting coronary stent that was placed in April of this year.  I discussed in detail that I will have him see  his cardiologist in order to make sure we can safely take him off the Plavix for open aortic surgery.  His last EF on echocardiogram from 01/16/2019 was 60-65% with normal systolic function.  He will see his cardiologist tomorrow.  Lawrence Heck, MD [facility] Office: [phone] Pager: [phone]

## 2019-06-18 NOTE — Anesthesia Procedure Notes (Signed)
Procedure Name: Intubation Date/Time: 05/30/2019 10:07 AM Performed by: Candis Shine, CRNA Pre-anesthesia Checklist: Patient identified, Emergency Drugs available, Suction available and Patient being monitored Patient Re-evaluated:Patient Re-evaluated prior to induction Oxygen Delivery Method: Circle System Utilized Preoxygenation: Pre-oxygenation with 100% oxygen Induction Type: IV induction Ventilation: Mask ventilation without difficulty and Oral airway inserted - appropriate to patient size Laryngoscope Size: Mac and 4 Grade View: Grade II Tube type: Oral Tube size: 8.0 mm Number of attempts: 1 Airway Equipment and Method: Stylet and Oral airway Placement Confirmation: ETT inserted through vocal cords under direct vision,  positive ETCO2 and breath sounds checked- equal and bilateral Secured at: 23 cm Tube secured with: Tape Dental Injury: Teeth and Oropharynx as per pre-operative assessment

## 2019-06-18 NOTE — Anesthesia Procedure Notes (Signed)
Arterial Line Insertion Start/End9/30/2020 9:30 AM Performed by: Wilburn Cornelia, CRNA, CRNA  Patient location: Pre-op. Preanesthetic checklist: patient identified, IV checked, site marked, risks and benefits discussed, surgical consent, monitors and equipment checked, pre-op evaluation, timeout performed and anesthesia consent Lidocaine 1% used for infiltration Left, radial was placed Catheter size: 20 G Hand hygiene performed  and maximum sterile barriers used   Attempts: 1 Procedure performed without using ultrasound guided technique. Following insertion, dressing applied and Biopatch. Post procedure assessment: normal and unchanged  Patient tolerated the procedure well with no immediate complications.

## 2019-06-18 NOTE — Progress Notes (Signed)
eLink Physician-Brief Progress Note Patient Name: Lawrence Wallace DOB: February 21, 1956 MRN: [id number]   Date of Service  06/03/2019  HPI/Events of Note  K+ 5.8. PCO2 62, PH 7.19  eICU Interventions  Increase respiratory rate to 20, obtain ABG and BMET 2 hours after change in RR, if K+ remains elevated with normalized PH will treat the hyperkalemia.        Kerry Kass Dameon Soltis 05/31/2019, 11:18 PM

## 2019-06-18 NOTE — Transfer of Care (Signed)
Immediate Anesthesia Transfer of Care Note  Patient: Lawrence Wallace  Procedure(s) Performed: REPAIR OF  ABDOMINAL AORTIC ANEURYSM USING 18 X 9MM X 40 CM HEMASHIELD GOLD GRAFT (N/A Abdomen)  Patient Location: ICU  Anesthesia Type:General  Level of Consciousness: sedated and Patient remains intubated per anesthesia plan  Airway & Oxygen Therapy: Patient remains intubated per anesthesia plan and Patient placed on Ventilator (see vital sign flow sheet for setting)  Post-op Assessment: Report given to RN and Post -op Vital signs reviewed and stable  Post vital signs: Reviewed and stable  Last Vitals:  Vitals Value Taken Time  BP 166/92 06/12/2019 1643  Temp 36.6 C 05/21/2019 1647  Pulse 116 06/06/2019 1647  Resp 14 05/22/2019 1647  SpO2 96 % 05/23/2019 1647  Vitals shown include unvalidated device data.  Last Pain:  Vitals:   05/24/2019 0855  TempSrc:   PainSc: 0-No pain         Complications: No apparent anesthesia complications

## 2019-06-18 NOTE — Anesthesia Preprocedure Evaluation (Signed)
Anesthesia Evaluation  Patient identified by MRN, date of birth, ID band Patient awake    Reviewed: Allergy & Precautions, NPO status , Patient's Chart, lab work & pertinent test results, reviewed documented beta blocker date and time   Airway Mallampati: II  TM Distance: >3 FB Neck ROM: Full    Dental  (+) Dental Advisory Given, Poor Dentition, Missing   Pulmonary asthma , Current SmokerPatient did not abstain from smoking.,    Pulmonary exam normal breath sounds clear to auscultation       Cardiovascular hypertension, Pt. on home beta blockers + CAD, + Past MI, + Cardiac Stents and + Peripheral Vascular Disease (abdominal aortic aneurysm)  Normal cardiovascular exam Rhythm:Regular Rate:Normal     Neuro/Psych negative neurological ROS     GI/Hepatic Neg liver ROS, GERD  ,  Endo/Other  negative endocrine ROS  Renal/GU negative Renal ROS     Musculoskeletal negative musculoskeletal ROS (+)   Abdominal   Peds  Hematology  (+) Blood dyscrasia (Plavix), ,   Anesthesia Other Findings Day of surgery medications reviewed with the patient.  Reproductive/Obstetrics                             Anesthesia Physical Anesthesia Plan  ASA: IV  Anesthesia Plan: General   Post-op Pain Management:    Induction: Intravenous  PONV Risk Score and Plan: 2 and Midazolam, Dexamethasone and Ondansetron  Airway Management Planned: Oral ETT  Additional Equipment: Arterial line, CVP, PA Cath and Ultrasound Guidance Line Placement  Intra-op Plan:   Post-operative Plan: Extubation in OR  Informed Consent: I have reviewed the patients History and Physical, chart, labs and discussed the procedure including the risks, benefits and alternatives for the proposed anesthesia with the patient or authorized representative who has indicated his/her understanding and acceptance.     Dental advisory given  Plan  Discussed with: CRNA  Anesthesia Plan Comments:         Anesthesia Quick Evaluation

## 2019-06-18 NOTE — Anesthesia Procedure Notes (Signed)
Central Venous Catheter Insertion Performed by: Catalina Gravel, MD, anesthesiologist Start/End09/26/2020 9:15 AM, 06/18/2019 9:25 AM Patient location: Pre-op. Preanesthetic checklist: patient identified, IV checked, site marked, risks and benefits discussed, surgical consent, monitors and equipment checked, pre-op evaluation and timeout performed Position: Trendelenburg Hand hygiene performed  and maximum sterile barriers used  Total catheter length 100. PA cath was placed.Swan type:thermodilution PA Cath depth:55 Procedure performed without using ultrasound guided technique. Attempts: 2 Patient tolerated the procedure well with no immediate complications.

## 2019-06-18 NOTE — Anesthesia Postprocedure Evaluation (Signed)
Anesthesia Post Note  Patient: Saber D Lawn  Procedure(s) Performed: REPAIR OF  ABDOMINAL AORTIC ANEURYSM USING 18 X 9MM X 40 CM HEMASHIELD GOLD GRAFT (N/A Abdomen)     Patient location during evaluation: SICU Anesthesia Type: General Level of consciousness: sedated Pain management: pain level controlled Vital Signs Assessment: post-procedure vital signs reviewed and stable Respiratory status: patient remains intubated per anesthesia plan Cardiovascular status: blood pressure returned to baseline, stable and tachycardic Postop Assessment: no apparent nausea or vomiting Anesthetic complications: no    Last Vitals:  Vitals:   06/01/2019 0828 06/09/2019 1633  BP: 124/77 (!) 106/55  Pulse: 75 (!) 113  Resp: 18 12  Temp: 36.6 C   SpO2: 97% 96%    Last Pain:  Vitals:   05/25/2019 0855  TempSrc:   PainSc: 0-No pain                 Catalina Gravel

## 2019-06-19 ENCOUNTER — Inpatient Hospital Stay (HOSPITAL_COMMUNITY): Payer: Medicare Other

## 2019-06-19 ENCOUNTER — Encounter (HOSPITAL_COMMUNITY): Payer: Self-pay | Admitting: Vascular Surgery

## 2019-06-19 DIAGNOSIS — N179 Acute kidney failure, unspecified: Secondary | ICD-10-CM

## 2019-06-19 DIAGNOSIS — I361 Nonrheumatic tricuspid (valve) insufficiency: Secondary | ICD-10-CM

## 2019-06-19 LAB — BPAM FFP
Blood Product Expiration Date: 202010042359
Blood Product Expiration Date: 202010052359
ISSUE DATE / TIME: 202009301409
ISSUE DATE / TIME: 202009301409
Unit Type and Rh: 6200
Unit Type and Rh: 6200

## 2019-06-19 LAB — CBC WITH DIFFERENTIAL/PLATELET
Abs Immature Granulocytes: 0.07 10*3/uL (ref 0.00–0.07)
Basophils Absolute: 0 10*3/uL (ref 0.0–0.1)
Basophils Relative: 0 %
Eosinophils Absolute: 0 10*3/uL (ref 0.0–0.5)
Eosinophils Relative: 0 %
HCT: 36.1 % — ABNORMAL LOW (ref 39.0–52.0)
Hemoglobin: 11.9 g/dL — ABNORMAL LOW (ref 13.0–17.0)
Immature Granulocytes: 1 %
Lymphocytes Relative: 11 %
Lymphs Abs: 1.7 10*3/uL (ref 0.7–4.0)
MCH: 30.7 pg (ref 26.0–34.0)
MCHC: 33 g/dL (ref 30.0–36.0)
MCV: 93 fL (ref 80.0–100.0)
Monocytes Absolute: 1.5 10*3/uL — ABNORMAL HIGH (ref 0.1–1.0)
Monocytes Relative: 10 %
Neutro Abs: 11.6 10*3/uL — ABNORMAL HIGH (ref 1.7–7.7)
Neutrophils Relative %: 78 %
Platelets: 55 10*3/uL — ABNORMAL LOW (ref 150–400)
RBC: 3.88 MIL/uL — ABNORMAL LOW (ref 4.22–5.81)
RDW: 14.6 % (ref 11.5–15.5)
WBC: 14.9 10*3/uL — ABNORMAL HIGH (ref 4.0–10.5)
nRBC: 0.1 % (ref 0.0–0.2)

## 2019-06-19 LAB — MAGNESIUM
Magnesium: 1.7 mg/dL (ref 1.7–2.4)
Magnesium: 1.5 mg/dL — ABNORMAL LOW (ref 1.7–2.4)

## 2019-06-19 LAB — POCT I-STAT 7, (LYTES, BLD GAS, ICA,H+H)
Acid-base deficit: 10 mmol/L — ABNORMAL HIGH (ref 0.0–2.0)
Acid-base deficit: 2 mmol/L (ref 0.0–2.0)
Acid-base deficit: 11 mmol/L — ABNORMAL HIGH (ref 0.0–2.0)
Acid-base deficit: 4 mmol/L — ABNORMAL HIGH (ref 0.0–2.0)
Acid-base deficit: 5 mmol/L — ABNORMAL HIGH (ref 0.0–2.0)
Bicarbonate: 16.6 mmol/L — ABNORMAL LOW (ref 20.0–28.0)
Bicarbonate: 23.5 mmol/L (ref 20.0–28.0)
Bicarbonate: 17 mmol/L — ABNORMAL LOW (ref 20.0–28.0)
Bicarbonate: 20 mmol/L (ref 20.0–28.0)
Bicarbonate: 25.2 mmol/L (ref 20.0–28.0)
Calcium, Ion: 1.19 mmol/L (ref 1.15–1.40)
Calcium, Ion: 1.18 mmol/L (ref 1.15–1.40)
Calcium, Ion: 1.09 mmol/L — ABNORMAL LOW (ref 1.15–1.40)
Calcium, Ion: 1.19 mmol/L (ref 1.15–1.40)
Calcium, Ion: 1.19 mmol/L (ref 1.15–1.40)
HCT: 44 % (ref 39.0–52.0)
HCT: 39 % (ref 39.0–52.0)
HCT: 38 % — ABNORMAL LOW (ref 39.0–52.0)
HCT: 41 % (ref 39.0–52.0)
HCT: 47 % (ref 39.0–52.0)
Hemoglobin: 15 g/dL (ref 13.0–17.0)
Hemoglobin: 13.3 g/dL (ref 13.0–17.0)
Hemoglobin: 12.9 g/dL — ABNORMAL LOW (ref 13.0–17.0)
Hemoglobin: 13.9 g/dL (ref 13.0–17.0)
Hemoglobin: 16 g/dL (ref 13.0–17.0)
O2 Saturation: 98 %
O2 Saturation: 98 %
O2 Saturation: 95 %
O2 Saturation: 98 %
O2 Saturation: 99 %
Patient temperature: 37.5
Patient temperature: 37.6
Potassium: 6.2 mmol/L — ABNORMAL HIGH (ref 3.5–5.1)
Potassium: 4.9 mmol/L (ref 3.5–5.1)
Potassium: 5.1 mmol/L (ref 3.5–5.1)
Potassium: 5.8 mmol/L — ABNORMAL HIGH (ref 3.5–5.1)
Potassium: 6.6 mmol/L (ref 3.5–5.1)
Sodium: 139 mmol/L (ref 135–145)
Sodium: 142 mmol/L (ref 135–145)
Sodium: 139 mmol/L (ref 135–145)
Sodium: 139 mmol/L (ref 135–145)
Sodium: 140 mmol/L (ref 135–145)
TCO2: 18 mmol/L — ABNORMAL LOW (ref 22–32)
TCO2: 25 mmol/L (ref 22–32)
TCO2: 18 mmol/L — ABNORMAL LOW (ref 22–32)
TCO2: 21 mmol/L — ABNORMAL LOW (ref 22–32)
TCO2: 27 mmol/L (ref 22–32)
pCO2 arterial: 39.7 mmHg (ref 32.0–48.0)
pCO2 arterial: 42 mmHg (ref 32.0–48.0)
pCO2 arterial: 38.4 mmHg (ref 32.0–48.0)
pCO2 arterial: 45.3 mmHg (ref 32.0–48.0)
pCO2 arterial: 63 mmHg — ABNORMAL HIGH (ref 32.0–48.0)
pH, Arterial: 7.231 — ABNORMAL LOW (ref 7.350–7.450)
pH, Arterial: 7.36 (ref 7.350–7.450)
pH, Arterial: 7.182 — CL (ref 7.350–7.450)
pH, Arterial: 7.21 — ABNORMAL LOW (ref 7.350–7.450)
pH, Arterial: 7.327 — ABNORMAL LOW (ref 7.350–7.450)
pO2, Arterial: 132 mmHg — ABNORMAL HIGH (ref 83.0–108.0)
pO2, Arterial: 111 mmHg — ABNORMAL HIGH (ref 83.0–108.0)
pO2, Arterial: 108 mmHg (ref 83.0–108.0)
pO2, Arterial: 179 mmHg — ABNORMAL HIGH (ref 83.0–108.0)
pO2, Arterial: 95 mmHg (ref 83.0–108.0)

## 2019-06-19 LAB — PREPARE FRESH FROZEN PLASMA
Unit division: 0
Unit division: 0

## 2019-06-19 LAB — COMPREHENSIVE METABOLIC PANEL
ALT: 198 U/L — ABNORMAL HIGH (ref 0–44)
ALT: 511 U/L — ABNORMAL HIGH (ref 0–44)
AST: 364 U/L — ABNORMAL HIGH (ref 15–41)
AST: 1432 U/L — ABNORMAL HIGH (ref 15–41)
Albumin: 2.6 g/dL — ABNORMAL LOW (ref 3.5–5.0)
Albumin: 3.1 g/dL — ABNORMAL LOW (ref 3.5–5.0)
Alkaline Phosphatase: 51 U/L (ref 38–126)
Alkaline Phosphatase: 35 U/L — ABNORMAL LOW (ref 38–126)
Anion gap: 12 (ref 5–15)
Anion gap: 11 (ref 5–15)
BUN: 18 mg/dL (ref 8–23)
BUN: 26 mg/dL — ABNORMAL HIGH (ref 8–23)
CO2: 16 mmol/L — ABNORMAL LOW (ref 22–32)
CO2: 24 mmol/L (ref 22–32)
Calcium: 8 mg/dL — ABNORMAL LOW (ref 8.9–10.3)
Calcium: 7.8 mg/dL — ABNORMAL LOW (ref 8.9–10.3)
Chloride: 108 mmol/L (ref 98–111)
Chloride: 106 mmol/L (ref 98–111)
Creatinine, Ser: 2.05 mg/dL — ABNORMAL HIGH (ref 0.61–1.24)
Creatinine, Ser: 2.16 mg/dL — ABNORMAL HIGH (ref 0.61–1.24)
GFR calc Af Amer: 38 mL/min — ABNORMAL LOW (ref >60)
GFR calc Af Amer: 36 mL/min — ABNORMAL LOW (ref >60)
GFR calc non Af Amer: 33 mL/min — ABNORMAL LOW (ref >60)
GFR calc non Af Amer: 31 mL/min — ABNORMAL LOW (ref >60)
Glucose, Bld: 173 mg/dL — ABNORMAL HIGH (ref 70–99)
Glucose, Bld: 146 mg/dL — ABNORMAL HIGH (ref 70–99)
Potassium: 6.6 mmol/L (ref 3.5–5.1)
Potassium: 5.1 mmol/L (ref 3.5–5.1)
Sodium: 136 mmol/L (ref 135–145)
Sodium: 141 mmol/L (ref 135–145)
Total Bilirubin: 1.7 mg/dL — ABNORMAL HIGH (ref 0.3–1.2)
Total Bilirubin: 2.4 mg/dL — ABNORMAL HIGH (ref 0.3–1.2)
Total Protein: 4.7 g/dL — ABNORMAL LOW (ref 6.5–8.1)
Total Protein: 4.4 g/dL — ABNORMAL LOW (ref 6.5–8.1)

## 2019-06-19 LAB — GLUCOSE, CAPILLARY
Glucose-Capillary: 101 mg/dL — ABNORMAL HIGH (ref 70–99)
Glucose-Capillary: 92 mg/dL (ref 70–99)

## 2019-06-19 LAB — CBC
HCT: 48.7 % (ref 39.0–52.0)
Hemoglobin: 15.5 g/dL (ref 13.0–17.0)
MCH: 30.8 pg (ref 26.0–34.0)
MCHC: 31.8 g/dL (ref 30.0–36.0)
MCV: 96.8 fL (ref 80.0–100.0)
Platelets: 106 10*3/uL — ABNORMAL LOW (ref 150–400)
RBC: 5.03 MIL/uL (ref 4.22–5.81)
RDW: 14.7 % (ref 11.5–15.5)
WBC: 24.1 10*3/uL — ABNORMAL HIGH (ref 4.0–10.5)
nRBC: 0.1 % (ref 0.0–0.2)

## 2019-06-19 LAB — ECHOCARDIOGRAM COMPLETE
Height: 74 in
Weight: 3276.92 oz

## 2019-06-19 LAB — LACTIC ACID, PLASMA: Lactic Acid, Venous: 3.2 mmol/L (ref 0.5–1.9)

## 2019-06-19 LAB — AMYLASE: Amylase: 485 U/L — ABNORMAL HIGH (ref 28–100)

## 2019-06-19 LAB — PHOSPHORUS: Phosphorus: 5.5 mg/dL — ABNORMAL HIGH (ref 2.5–4.6)

## 2019-06-19 MED ORDER — PERFLUTREN LIPID MICROSPHERE
INTRAVENOUS | Status: AC
  Administered 2019-06-19: 10:00:00 4 mg
  Filled 2019-06-19: qty 10

## 2019-06-19 MED ORDER — STERILE WATER FOR INJECTION IV SOLN
INTRAVENOUS | Status: DC
  Administered 2019-06-19: 01:00:00 via INTRAVENOUS
  Filled 2019-06-19 (×2): qty 850

## 2019-06-19 MED ORDER — FENTANYL 2500MCG IN NS 250ML (10MCG/ML) PREMIX INFUSION
0.0000 ug/h | INTRAVENOUS | Status: DC
  Administered 2019-06-19: 25 ug/h via INTRAVENOUS
  Administered 2019-06-20: 13:00:00 20 ug/h via INTRAVENOUS
  Filled 2019-06-19 (×2): qty 250

## 2019-06-19 MED ORDER — DEXMEDETOMIDINE HCL IN NACL 400 MCG/100ML IV SOLN
0.4000 ug/kg/h | INTRAVENOUS | Status: DC
  Administered 2019-06-19: 0.4 ug/kg/h via INTRAVENOUS
  Administered 2019-06-19: 19:00:00 0.8 ug/kg/h via INTRAVENOUS
  Administered 2019-06-20: 0.5 ug/kg/h via INTRAVENOUS
  Administered 2019-06-20 – 2019-06-21 (×2): 0.6 ug/kg/h via INTRAVENOUS
  Administered 2019-06-21: 19:00:00 1.2 ug/kg/h via INTRAVENOUS
  Administered 2019-06-21: 0.7 ug/kg/h via INTRAVENOUS
  Administered 2019-06-22: 0.5 ug/kg/h via INTRAVENOUS
  Administered 2019-06-22: 1 ug/kg/h via INTRAVENOUS
  Administered 2019-06-22: 08:00:00 0.2 ug/kg/h via INTRAVENOUS
  Administered 2019-06-22: 0.3 ug/kg/h via INTRAVENOUS
  Administered 2019-06-22: 0.7 ug/kg/h via INTRAVENOUS
  Administered 2019-06-23: 0.8 ug/kg/h via INTRAVENOUS
  Administered 2019-06-23: 0.6 ug/kg/h via INTRAVENOUS
  Administered 2019-06-23: 1 ug/kg/h via INTRAVENOUS
  Administered 2019-06-23 – 2019-06-24 (×2): 0.8 ug/kg/h via INTRAVENOUS
  Administered 2019-06-24: 08:00:00 0.896 ug/kg/h via INTRAVENOUS
  Administered 2019-06-24 (×3): 0.8 ug/kg/h via INTRAVENOUS
  Administered 2019-06-25: 0.6 ug/kg/h via INTRAVENOUS
  Administered 2019-06-25 (×2): 0.8 ug/kg/h via INTRAVENOUS
  Administered 2019-06-25: 08:00:00 0.6 ug/kg/h via INTRAVENOUS
  Administered 2019-06-26: 23:00:00 1 ug/kg/h via INTRAVENOUS
  Administered 2019-06-26: 03:00:00 0.6 ug/kg/h via INTRAVENOUS
  Administered 2019-06-26 (×2): 1.2 ug/kg/h via INTRAVENOUS
  Administered 2019-06-26: 20:00:00 0.8 ug/kg/h via INTRAVENOUS
  Administered 2019-06-26 – 2019-06-27 (×4): 1.2 ug/kg/h via INTRAVENOUS
  Filled 2019-06-19 (×23): qty 100
  Filled 2019-06-19: qty 200
  Filled 2019-06-19 (×14): qty 100

## 2019-06-19 MED ORDER — SODIUM BICARBONATE 8.4 % IV SOLN
100.0000 meq | Freq: Once | INTRAVENOUS | Status: AC
  Administered 2019-06-19: 100 meq via INTRAVENOUS

## 2019-06-19 MED ORDER — CALCIUM GLUCONATE-NACL 1-0.675 GM/50ML-% IV SOLN
1.0000 g | Freq: Once | INTRAVENOUS | Status: AC
  Administered 2019-06-19: 1000 mg via INTRAVENOUS
  Filled 2019-06-19: qty 50

## 2019-06-19 MED ORDER — ALBUMIN HUMAN 5 % IV SOLN
25.0000 g | Freq: Once | INTRAVENOUS | Status: AC
  Administered 2019-06-19: 25 g via INTRAVENOUS
  Filled 2019-06-19: qty 250

## 2019-06-19 MED ORDER — ALBUMIN HUMAN 5 % IV SOLN
12.5000 g | Freq: Once | INTRAVENOUS | Status: AC
  Administered 2019-06-19: 12.5 g via INTRAVENOUS
  Filled 2019-06-19: qty 250

## 2019-06-19 MED ORDER — INSULIN ASPART 100 UNIT/ML ~~LOC~~ SOLN
10.0000 [IU] | Freq: Once | SUBCUTANEOUS | Status: AC
  Administered 2019-06-19: 01:00:00 10 [IU] via INTRAVENOUS

## 2019-06-19 MED ORDER — PERFLUTREN LIPID MICROSPHERE
1.0000 mL | INTRAVENOUS | Status: AC | PRN
  Filled 2019-06-19: qty 10

## 2019-06-19 MED ORDER — SODIUM CHLORIDE 0.9% IV SOLUTION
Freq: Once | INTRAVENOUS | Status: AC
  Administered 2019-06-19: 11:00:00 via INTRAVENOUS

## 2019-06-19 MED ORDER — LACTATED RINGERS IV BOLUS
1000.0000 mL | Freq: Once | INTRAVENOUS | Status: AC
  Administered 2019-06-19: 13:00:00 1000 mL via INTRAVENOUS

## 2019-06-19 MED ORDER — SODIUM CHLORIDE 0.9 % IV BOLUS
1000.0000 mL | Freq: Once | INTRAVENOUS | Status: AC
  Administered 2019-06-19: 1000 mL via INTRAVENOUS

## 2019-06-19 MED ORDER — DOCUSATE SODIUM 50 MG/5ML PO LIQD
100.0000 mg | Freq: Every day | ORAL | Status: DC
  Administered 2019-06-19 – 2019-06-26 (×6): 100 mg via ORAL
  Filled 2019-06-19 (×6): qty 10

## 2019-06-19 MED ORDER — SODIUM BICARBONATE 8.4 % IV SOLN
INTRAVENOUS | Status: AC
  Filled 2019-06-19: qty 100

## 2019-06-19 MED ORDER — DEXTROSE 50 % IV SOLN
50.0000 mL | Freq: Once | INTRAVENOUS | Status: AC
  Administered 2019-06-19: 50 mL via INTRAVENOUS
  Filled 2019-06-19: qty 50

## 2019-06-19 NOTE — Plan of Care (Signed)

## 2019-06-19 NOTE — Consult Note (Signed)
NAME:  Lawrence Wallace, MRN:  [id number], DOB:  March 29, 1956, LOS: 1 ADMISSION DATE:  06/17/2019, CONSULTATION DATE:  05/30/2019 REFERRING MD:  Carlis Abbott  CHIEF COMPLAINT:  Vent Management   Brief History   Lawrence Wallace is a 63 y.o. male who was admitted 9/30 for repair of large AAA.  He had significant intraoperative blood loss with subsequent hypotension requiring 5L IVF's, 2u PRBC, 2 FFP, 2.5L from cellsaver, and neosynephrine.  Upon leaving PACU, he was somewhat hypertensive and was started on cleviprex.  History of present illness   Pt is encephelopathic; therefore, this HPI is obtained from chart review. Lawrence Wallace is a 63 y.o. male who has a PMH including but not limited to tobacco, abuse, AAA, NSTEMI, HTN, HLD, CAD, skin CA, GERD.  He presented to [facility] 9/30 for repair of 7cm AAA and 5cm R common iliac artery aneurysm.  He had significant blood loss intraoperatively and required 5L IVF's, 2u PRBC, 2 FFP, 2.5L cell saver, and neosynephrine.  Just prior to leaving PACU, he was noted to be hypertensive and was started on cleviprex.  Neosynephrine was discontinued upon arrival to ICU and cleviprex is being titrated down.  Past Medical History  tobacco, abuse, AAA, NSTEMI, HTN, HLD, CAD, skin CA, GERD.  Significant Hospital Events   9/30 > admitted for OR repair of 7cm AAA and 5cm R common iliac artery aneurysm.  Consults:  PCCM.  Procedures:  ETT 9/30 >  L rad art line 9/30 >  R swan 9/30 >   Significant Diagnostic Tests:  None.  Micro Data:  None.  Antimicrobials:  None.   Interim history/subjective:  Overnight, he required increased pressor support and started on bicarbonate gtt.  Objective:  Blood pressure (!) 108/55, pulse 81, temperature 99.3 F (37.4 C), resp. rate 20, height 6\' 2"  (1.88 m), weight 92.9 kg, SpO2 100 %. CVP:  [5 mmHg-12 mmHg] 12 mmHg  Vent Mode: PRVC FiO2 (%):  [40 %-50 %] 40 % Set Rate:  [12 bmp-20 bmp] 20 bmp Vt Set:  [650 mL] 650 mL PEEP:  [5 cmH20] 5  cmH20 Plateau Pressure:  [14 cmH20-16 cmH20] 16 cmH20   Intake/Output Summary (Last 24 hours) at 06/19/2019 0841 Last data filed at 06/19/2019 0400 Gross per 24 hour  Intake 11795.17 ml  Output 7210 ml  Net 4585.17 ml   Filed Weights   06/16/2019 0828  Weight: 92.9 kg   Physical Exam: General: Chronically ill-appearing, no acute distress HENT: Lawrence Wallace, AT, ETT in tube Eyes: EOMI, no scleral icterus Respiratory: Diminished breath sounds bilaterally. No crackles, wheezing or rales Cardiovascular: RRR, -M/R/G, no JVD GI: BS+, soft, nontender Extremities:-Edema,-tenderness Neuro: Sedated Skin: Pale, mottled skin significantly improved with evidence in the lower extremities GU: Foley in place  Assessment & Plan:  63 year old male with AAA and bilateral common iliac aneurysms s/p elective open AAA repair with intra-op EBL 6600 ml. He was resuscitated with 5.2L LR, 2.5L cellsaver, 2U PRBC and 2U FFP.   Hypotensive/Hemorrhagic Shock  -Give 1L LR bolus now -Wean Levophed and Vasopressin for goal MAP >65 -Trend CBC. Transfuse for Hg goal >7 -If febrile, low threshold to culture and start antibiotics -Ordered TTE  Incidental AAA s/p open repair  -7.4cm infrarenal aneurysm, EBL 6 units with 2.5 units returned via cell-saver  -Was hypotensive peri-operatively and started on Neo, but then hypertensive post-op and initiated on Cleviprex P: -post op plan per vascular -Off cleviprex -goal maintain normotension (SBP 100-140)  Acute  Respiratory Insufficiency secondary to above  -At risk for TRALI/TACO P: -Full vent support. Hold on weaning due to critical illness. -VAP bundle  -Precedex and Fentanyl for Rass goal -1  AKI with hyperkalemia -Likely pre-renal. Hyperkalemia improved. No acute indication for dialysis P: -Trend CMP -Monitor UOP  Transaminitis -Likely ischemic hepatitis P: -Trend CMP  Acute blood loss anemia Post-op thrombocytopenia P: Transfuse 1U Plt for goal > 50K   Hx HTN, CAD, NSTEMI P: -hold home lopressor, plavix, Asa and statin  Discussed care with primary team.  Best Practice:  Diet: NPO. Pain/Anxiety/Delirium protocol (if indicated): Precedex gtt / fentanyl PRN.  RASS goal -1. VAP protocol (if indicated): In place. DVT prophylaxis: SCD's. GI prophylaxis: PPI. Glucose control: SSI if glucose consistently > 180. Mobility: Bedrest. Code Status: Full. Family Communication: None available. Disposition: ICU  Labs   CBC: Recent Labs  Lab 06/16/19 0858  05/28/2019 1651 06/08/2019 1652 05/28/2019 2211 06/19/19 0000 06/19/19 0506  WBC 8.2  --  12.3*  --   --  24.1*  --   HGB 17.1*   < > 14.3 14.3 15.0 15.5 13.3  HCT 52.5*   < > 44.3 42.0 44.0 48.7 39.0  MCV 94.9  --  96.1  --   --  96.8  --   PLT 203  --  PLATELET CLUMPS NOTED ON SMEAR, UNABLE TO ESTIMATE  --   --  106*  --    < > = values in this interval not displayed.   Basic Metabolic Panel: Recent Labs  Lab 06/16/19 0858  05/24/2019 1651 06/14/2019 1652 06/09/2019 2108 06/04/2019 2211 06/19/19 0000 06/19/19 0506  NA 136   < > 139 139 139 139 136 142  K 4.8   < > 5.2* 5.2* 5.8* 6.2* 6.6* 4.9  CL 103  --  105  --  108  --  108  --   CO2 21*  --  23  --  16*  --  16*  --   GLUCOSE 107*  --  253*  --  188*  --  173*  --   BUN 10  --  13  --  15  --  18  --   CREATININE 0.80  --  1.30*  --  1.76*  --  2.05*  --   CALCIUM 9.5  --  8.2*  --  8.0*  --  8.0*  --   MG  --   --  1.4*  --   --   --  1.7  --   PHOS  --   --  6.6*  --   --   --   --   --    < > = values in this interval not displayed.   GFR: Estimated Creatinine Clearance: 41.8 mL/min (A) (by C-G formula based on SCr of 2.05 mg/dL (H)). Recent Labs  Lab 06/16/19 0858 06/07/2019 1651 06/19/19 0000  WBC 8.2 12.3* 24.1*   Liver Function Tests: Recent Labs  Lab 06/16/19 0858 05/29/2019 1651 06/19/19 0000  AST 28 134* 364*  ALT 24 95* 198*  ALKPHOS 60 37* 51  BILITOT 1.2 1.5* 1.7*  PROT 6.9 4.1* 4.7*  ALBUMIN 3.8  2.5* 2.6*   Recent Labs  Lab 06/19/19 0000  AMYLASE 485*   No results for input(s): AMMONIA in the last 168 hours. ABG    Component Value Date/Time   PHART 7.360 06/19/2019 0506   PCO2ART 42.0 06/19/2019 0506   PO2ART 111.0 (H) 06/19/2019  0506   HCO3 23.5 06/19/2019 0506   TCO2 25 06/19/2019 0506   ACIDBASEDEF 2.0 06/19/2019 0506   O2SAT 98.0 06/19/2019 0506    Coagulation Profile: Recent Labs  Lab 06/16/19 0858 05/22/2019 1651  INR 1.0 1.5*   Cardiac Enzymes: No results for input(s): CKTOTAL, CKMB, CKMBINDEX, TROPONINI in the last 168 hours. HbA1C: Hgb A1c MFr Bld  Date/Time Value Ref Range Status  12/10/2017 09:20 AM 5.9 4.6 - 6.5 % Final    Comment:    Glycemic Control Guidelines for People with Diabetes:Non Diabetic:  <6%Goal of Therapy: <7%Additional Action Suggested:  >8%   06/29/2016 08:11 AM 5.7 4.6 - 6.5 % Final    Comment:    Glycemic Control Guidelines for People with Diabetes:Non Diabetic:  <6%Goal of Therapy: <7%Additional Action Suggested:  >8%    CBG: No results for input(s): GLUCAP in the last 168 hours.  The patient is critically ill with multiple organ systems failure and requires high complexity decision making for assessment and support, frequent evaluation and titration of therapies, application of advanced monitoring technologies and extensive interpretation of multiple databases.   Critical Care Time devoted to patient care services described in this note is 39 Minutes. Discussed care with Vascular team. Will transfuse 1U plt for goal >50K  Rodman Pickle, M.D. [facility] Pulmonary/Critical Care Medicine 06/19/2019 9:43 AM  Pager: [phone] After hours pager: [phone]

## 2019-06-19 NOTE — Progress Notes (Signed)
eLink Physician-Brief Progress Note Patient Name: Lawrence Wallace DOB: 1955/10/31 MRN: [id number]   Date of Service  06/19/2019  HPI/Events of Note  Hypotension despite Norepinephrine and vasopressin maximum doses.  eICU Interventions  Albumin 5 % 25 gm iv bolus x 1        Okoronkwo U Ogan 06/19/2019, 12:23 AM

## 2019-06-19 NOTE — Progress Notes (Addendum)
PCCM INTERVAL PROGRESS NOTE  Called to bedside for patient with refractory shock following AAA repair complicated by 6L EBL during the surgery. He is on max dose levophed and shock dose vaso. He has not responded to crystalloid. Recent ABG demonstrated mixed acidosis. SBP remains in 70s.   Plan: Repeat ABG Send CBC   ABG with pH 7.18 - Will give sodium bicarb 2 amps  K 6.6 on iSTAT ABG - normalize PH as above - give calcium gluconate 1g  Await CBC, he is pale and given massive blood loss with surgery suspect he may need further blood product. May need to add additional pressor in the mean time. Would use epinephrine.   Georgann Housekeeper, AGACNP-BC [place] Pager [phone] or [phone]  06/19/2019 12:08 AM  Addendum 10/1 0100:  CBC with hemoglobin 15.5 K 6.6  He dropped his pressure further and we gave 2 amps bicarb push. His BP responded very well. In effort to improve acidosis I have increased RR on vent to 26 (20) and will start sodium bicarb infusion at 75 mL/hr.   In regards to K, will give insulin and dextrose in addition to the calcium above. Hopefully by the time temporizing measures wear off his acid base status is improved.   Georgann Housekeeper, AGACNP-BC [place] Pager [phone] or [phone]  06/19/2019 1:12 AM

## 2019-06-19 NOTE — Progress Notes (Signed)
OT Cancellation Note  Patient Details Name: Lawrence Wallace MRN: [id number] DOB: 1955-11-02   Cancelled Treatment:    Reason Eval/Treat Not Completed: Patient not medically ready  [name] 06/19/2019, 11:56 AM  [name], OTR/L Acute Rehabilitation Services Pager: [phone] Office: [phone]

## 2019-06-19 NOTE — Progress Notes (Signed)
[facility] [place]  Subjective  -remains intubated.  Postop day 1 status post open abdominal aortic and right common iliac aneurysm repair with bifurcated graft.  Weaned off pressors overnight.  Labile BP per ICU.   Objective 115/85 82 99.3 F (37.4 C) (!) 22 100%  Intake/Output Summary (Last 24 hours) at 06/19/2019 0745 Last data filed at 06/19/2019 0400 Gross per 24 hour  Intake 11795.17 ml  Output 7210 ml  Net 4585.17 ml   Intubated, sedated, GCS 3 T Midline incision dressing clean and dry and intact Femoral pulses are palpable bilaterally Lower extremity mottling is improved except for the feet He has weakly palpable dorsalis pedis pulses bilaterally Confirmed with doppler very brisk posterior tibial and dorsalis pedis signals bilaterally  Laboratory Lab Results: Recent Labs    06/04/2019 1651  06/19/19 0000 06/19/19 0506  WBC 12.3*  --  24.1*  --   HGB 14.3   < > 15.5 13.3  HCT 44.3   < > 48.7 39.0  PLT PLATELET CLUMPS NOTED ON SMEAR, UNABLE TO ESTIMATE  --  106*  --    < > = values in this interval not displayed.   BMET Recent Labs    06/14/2019 2108  06/19/19 0000 06/19/19 0506  NA 139   < > 136 142  K 5.8*   < > 6.6* 4.9  CL 108  --  108  --   CO2 16*  --  16*  --   GLUCOSE 188*  --  173*  --   BUN 15  --  18  --   CREATININE 1.76*  --  2.05*  --   CALCIUM 8.0*  --  8.0*  --    < > = values in this interval not displayed.    COAG Lab Results  Component Value Date   INR 1.5 (H) 06/12/2019   INR 1.0 06/16/2019   No results found for: PTT  Assessment/Planning:  Postop day 1 status post open aortic aneurysm repair as well as right common iliac aneurysm repair with bifurcated dacron graft.  Remains intubated in the ICU critically ill.  Neuro: GCS 3 T this morning and intubated and sedated.  Bedside nurse reports that he was moving his lower extremities last night when sedation was turned off. Cardiovascular: Apparently was  maxed out on Levophed and vasopressin overnight.  This morning Levophed weaned to 5 mcg and vasopressin down to 0.03.  Hemodynamically improving.  Apparently blood pressure was very labile overnight and at times was very hypotensive and then hypertensive into the 220s.  Hemoglobin 15 on last check.  Will recheck labs this morning. Pulmonary: Remains intubated.  Respiratory acidosis improving.  Wean to extubate per critical care. GI: N.p.o.  NG to suction. Renal: Low urine output overnight.  Did get multiple albumin challenges.  AKI at this time with rising creatinine and last creatinine 2.05. FEN: Remains on bicarb drip at 75 that is helped improve his hemodynamics.  Last pH improved to 7 3.  Bicarb is 24. ID: Ancef for prophylaxis post-op  Appreciate critical care support.  [name], MD [facility] [place] Office: [phone] Pager: [place] 06/19/2019 7:45 AM --

## 2019-06-19 NOTE — Progress Notes (Signed)
eLink Physician-Brief Progress Note Patient Name: Lawrence Wallace DOB: 1956/02/01 MRN: [id number]   Date of Service  06/19/2019  HPI/Events of Note  Hypotension, pt dyssynchronous on the ventilator.  eICU Interventions  Albumin 5 % 12.5 gm iv x 1, Fentanyl infusion.        Lawrence Wallace 06/19/2019, 2:10 AM

## 2019-06-19 NOTE — Progress Notes (Signed)
eLink Physician-Brief Progress Note Patient Name: Lawrence Wallace DOB: 05/10/1956 MRN: [id number]   Date of Service  06/19/2019  HPI/Events of Note  Low urine output  eICU Interventions  Will give 250  ml of 5 %  Albumin iv x 1 now.        [name] 06/19/2019, 5:26 AM

## 2019-06-19 NOTE — Progress Notes (Signed)
Initial Nutrition Assessment  DOCUMENTATION CODES:   Not applicable  INTERVENTION:   Tube feeding recommendations: - Vital AF 1.2 @ 70 ml/hr (1680 ml/day) - Pro-stat 30 ml daily  Recommended tube feeding regimen provides 2116 kcal, 141 grams of protein, and 1362 ml of H2O.   NUTRITION DIAGNOSIS:   Increased nutrient needs related to post-op healing as evidenced by estimated needs.  GOAL:   Patient will meet greater than or equal to 90% of their needs  MONITOR:   Vent status, Labs, Weight trends, Skin, I & O's  REASON FOR ASSESSMENT:   Ventilator    ASSESSMENT:   63 year old male who presented on 9/30 for repair of abdominal aortic aneurysm and right common iliac artery aneurysm. Pt with significant intraoperative blood loss with subsequent hypotension requiring multiple blood products and pressors. PMH of CAD, skin cancer, GERD, HLD, HTN, NSTEMI.  Discussed pt with RN and during ICU rounds. Pt remains on the vent post-op. Per CCM note, holding on SBT for extubation.  5 French NG tube in place to low intermittent suction.  Reviewed weight history in chart. Weight has slowly trended down over the last 5 months. Pt with a 3 kg weight loss in 5 months. This is a 3.1% weight loss which is not significant for timeframe.  Patient is currently intubated on ventilator support MV: 13.9 L/min Temp (24hrs), Avg:99.2 F (37.3 C), Min:97.3 F (36.3 C), Max:100 F (37.8 C) BP (a-line): 156/65 MAP (a-line): 87  Drips: Fentanyl: 2.5 ml/hr Versed: 2 ml/hr Levophed: 9.4 ml/hr Vasopressin: 15 ml/hr  Medications reviewed and include: Colace, Protonix  Labs reviewed: creatinine 2.05, amylase 485, elevated LFTs  UOP: 610 ml x 24 hours EBL: 6600 ml x 24 hours I/O's: +4.5 L since admit  NUTRITION - FOCUSED PHYSICAL EXAM:    Most Recent Value  Orbital Region  No depletion  Upper Arm Region  No depletion  Thoracic and Lumbar Region  No depletion  Buccal Region  Unable to  assess  Temple Region  No depletion  Clavicle Bone Region  No depletion  Clavicle and Acromion Bone Region  Mild depletion  Scapular Bone Region  Unable to assess  Dorsal Hand  No depletion  Patellar Region  No depletion  Anterior Thigh Region  No depletion  Posterior Calf Region  Mild depletion  Edema (RD Assessment)  None  Hair  Reviewed  Eyes  Reviewed  Mouth  Reviewed  Skin  Reviewed  Nails  Reviewed       Diet Order:   Diet Order            Diet NPO time specified  Diet effective now              EDUCATION NEEDS:   No education needs have been identified at this time  Skin:  Skin Assessment: Skin Integrity Issues: Skin Integrity Issues: Incisions: closed abdomen  Last BM:  no documented BM  Height:   Ht Readings from Last 1 Encounters:  06/05/2019 6\' 2"  (1.88 m)    Weight:   Wt Readings from Last 1 Encounters:  05/23/2019 92.9 kg    Ideal Body Weight:  86.4 kg  BMI:  Body mass index is 26.3 kg/m.  Estimated Nutritional Needs:   Kcal:  2115  Protein:  120-140 grams  Fluid:  >/= 2.0 L    Gaynell Face, MS, RD, LDN Inpatient Clinical Dietitian Pager: [phone] Weekend/After Hours: [phone]

## 2019-06-19 NOTE — Progress Notes (Signed)
Echocardiogram 2D Echocardiogram has been performed.  Oneal Deputy Jatia Musa 06/19/2019, 10:34 AM

## 2019-06-19 NOTE — Progress Notes (Signed)
Pt agitated and lifting himself off the bed and biting on ETT. Pts peak pressure increased to 40's and RR in 50's. Md notified and PRN bolus given and precedex started per new order. Pt calm at this time. Will continue to monitor and titrate as needed.

## 2019-06-19 NOTE — Progress Notes (Signed)
PT Cancellation Note  Patient Details Name: KIYA ELEK MRN: [id number] DOB: Sep 17, 1956   Cancelled Treatment:    Reason Eval/Treat Not Completed: Patient not medically ready; will attempt another day.   Reginia Naas 06/19/2019, 3:54 PM  Magda Kiel, [place] [phone] 06/19/2019

## 2019-06-19 NOTE — Progress Notes (Addendum)
NAME:  Lawrence Wallace, MRN:  [id number], DOB:  [birth year], LOS: 1 ADMISSION DATE:  06/09/2019, CONSULTATION DATE:  06/12/2019 REFERRING MD:  Carlis Abbott  CHIEF COMPLAINT:  Vent Management   Brief History   Lawrence Wallace is a 63 y.o. male who was admitted 9/30 for repair of large AAA.  He had significant intraoperative blood loss with subsequent hypotension requiring 5L IVF's, 2u PRBC, 2 FFP, 2.5L from cellsaver, and neosynephrine.  Upon leaving PACU, he was somewhat hypertensive and was started on cleviprex.  History of present illness   Pt is encephelopathic; therefore, this HPI is obtained from chart review. Lawrence Wallace is a 64 y.o. male who has a PMH including but not limited to tobacco, abuse, AAA, NSTEMI, HTN, HLD, CAD, skin CA, GERD.  He presented to [facility] 9/30 for repair of 7cm AAA and 5cm R common iliac artery aneurysm.  He had significant blood loss intraoperatively and required 5L IVF's, 2u PRBC, 2 FFP, 2.5L cell saver, and neosynephrine.  Just prior to leaving PACU, he was noted to be hypertensive and was started on cleviprex.  Neosynephrine was discontinued upon arrival to ICU and cleviprex is being titrated down.  Past Medical History  tobacco, abuse, AAA, NSTEMI, HTN, HLD, CAD, skin CA, GERD.  Significant Hospital Events   9/30 > admitted for OR repair of 7cm AAA and 5cm R common iliac artery aneurysm.  Consults:  PCCM.  Procedures:  ETT 9/30 >  L rad art line 9/30 >  R swan 9/30 >   Significant Diagnostic Tests:  None.  Micro Data:  None.  Antimicrobials:  None.   Interim history/subjective:  Sedated. BP stable. Skin mottling is improved.  Per RN overnight patient required max dose of levophed and vasopressin for BP support and hyperkalemic. Is stable after receiving bicarb drip and albumin.   Objective:  Blood pressure 115/85, pulse 82, temperature 99.3 F (37.4 C), resp. rate (!) 22, height 6\' 2"  (1.88 m), weight 92.9 kg, SpO2 100 %. CVP:  [5 mmHg-12 mmHg] 12 mmHg   Vent Mode: PRVC FiO2 (%):  [50 %] 50 % Set Rate:  [12 bmp-20 bmp] 20 bmp Vt Set:  [650 mL] 650 mL PEEP:  [5 cmH20] 5 cmH20 Plateau Pressure:  [14 cmH20-15 cmH20] 15 cmH20   Intake/Output Summary (Last 24 hours) at 06/19/2019 0754 Last data filed at 06/19/2019 0400 Gross per 24 hour  Intake 11795.17 ml  Output 7210 ml  Net 4585.17 ml   Filed Weights   06/11/2019 0828  Weight: 92.9 kg    Examination: General: Adult male, critically ill, in NAD. Neuro: Sedated, not responsive. HEENT: [place]/AT. Sclerae anicteric.  ETT in place. Cardiovascular: RRR, no M/R/G.  Lungs: Respirations even and unlabored.  CTA bilaterally, No W/R/R.  Abdomen: Midline abdominal dressing C/D/I.  BS x 4, soft, NT/ND.  Musculoskeletal: No gross deformities, no edema.  Skin: Lower abdomen and bilateral LE's mottled.  Skin cool below ankle, no rashes.  Assessment & Plan:   Chronic Aortic Abdominal Aneurysm s/p open repair  -7.4cm infrarenal aneurysm, EBL 6 units with 2.5 units returned via cell-saver  -Was hypotensive peri-operatively and started on Neo, but then hypertensive post-op and initiated on Cleviprex  P: -post op plan per vascular -continue cleviprex and wean to off, goal maintain normotension (SBP 100-140) -monitor h/h and transfuse as needed   Acute Respiratory Insufficiency secondary to above  -At risk for TRALI/TACO  P: -Full Vent support PRVC, titrate FiO2 >92% -VAP bundle  -  Precedex and Fentanyl for Rass goal -1 -hold on SBT for extubation  Hx HTN, CAD, NSTEMI  P:  -hold home lopressor, plavix, Asa and statin  Rest per primary team.  Best Practice:  Diet: NPO. Pain/Anxiety/Delirium protocol (if indicated): Precedex gtt / fentanyl PRN.  RASS goal -1. VAP protocol (if indicated): In place. DVT prophylaxis: SCD's. GI prophylaxis: PPI. Glucose control: SSI if glucose consistently > 180. Mobility: Bedrest. Code Status: Full. Family Communication: None available. Disposition:  ICU.  Labs   CBC: Recent Labs  Lab 06/16/19 0858  05/27/2019 1651 06/04/2019 1652 05/26/2019 2211 06/19/19 0000 06/19/19 0506  WBC 8.2  --  12.3*  --   --  24.1*  --   HGB 17.1*   < > 14.3 14.3 15.0 15.5 13.3  HCT 52.5*   < > 44.3 42.0 44.0 48.7 39.0  MCV 94.9  --  96.1  --   --  96.8  --   PLT 203  --  PLATELET CLUMPS NOTED ON SMEAR, UNABLE TO ESTIMATE  --   --  106*  --    < > = values in this interval not displayed.   Basic Metabolic Panel: Recent Labs  Lab 06/16/19 0858  06/11/2019 1651 06/17/2019 1652 05/27/2019 2108 05/26/2019 2211 06/19/19 0000 06/19/19 0506  NA 136   < > 139 139 139 139 136 142  K 4.8   < > 5.2* 5.2* 5.8* 6.2* 6.6* 4.9  CL 103  --  105  --  108  --  108  --   CO2 21*  --  23  --  16*  --  16*  --   GLUCOSE 107*  --  253*  --  188*  --  173*  --   BUN 10  --  13  --  15  --  18  --   CREATININE 0.80  --  1.30*  --  1.76*  --  2.05*  --   CALCIUM 9.5  --  8.2*  --  8.0*  --  8.0*  --   MG  --   --  1.4*  --   --   --  1.7  --   PHOS  --   --  6.6*  --   --   --   --   --    < > = values in this interval not displayed.   GFR: Estimated Creatinine Clearance: 41.8 mL/min (A) (by C-G formula based on SCr of 2.05 mg/dL (H)). Recent Labs  Lab 06/16/19 0858 06/10/2019 1651 06/19/19 0000  WBC 8.2 12.3* 24.1*   Liver Function Tests: Recent Labs  Lab 06/16/19 0858 05/31/2019 1651 06/19/19 0000  AST 28 134* 364*  ALT 24 95* 198*  ALKPHOS 60 37* 51  BILITOT 1.2 1.5* 1.7*  PROT 6.9 4.1* 4.7*  ALBUMIN 3.8 2.5* 2.6*   Recent Labs  Lab 06/19/19 0000  AMYLASE 485*   No results for input(s): AMMONIA in the last 168 hours. ABG    Component Value Date/Time   PHART 7.360 06/19/2019 0506   PCO2ART 42.0 06/19/2019 0506   PO2ART 111.0 (H) 06/19/2019 0506   HCO3 23.5 06/19/2019 0506   TCO2 25 06/19/2019 0506   ACIDBASEDEF 2.0 06/19/2019 0506   O2SAT 98.0 06/19/2019 0506    Coagulation Profile: Recent Labs  Lab 06/16/19 0858 06/03/2019 1651  INR 1.0 1.5*    Cardiac Enzymes: No results for input(s): CKTOTAL, CKMB, CKMBINDEX, TROPONINI in the last 168 hours.  HbA1C: Hgb A1c MFr Bld  Date/Time Value Ref Range Status  12/10/2017 09:20 AM 5.9 4.6 - 6.5 % Final    Comment:    Glycemic Control Guidelines for People with Diabetes:Non Diabetic:  <6%Goal of Therapy: <7%Additional Action Suggested:  >8%   06/29/2016 08:11 AM 5.7 4.6 - 6.5 % Final    Comment:    Glycemic Control Guidelines for People with Diabetes:Non Diabetic:  <6%Goal of Therapy: <7%Additional Action Suggested:  >8%    CBG: No results for input(s): GLUCAP in the last 168 hours.  Review of Systems:   Unable to obtain as pt is encephalopathic.  Past medical history  He,  has a past medical history of Asthma, Blood loss anemia, CAD in native artery, Cancer ([place]), Coronary artery disease, GERD (gastroesophageal reflux disease), Hyperlipidemia LDL goal <70 (01/17/2019), Hypertension (01/17/2019), NSTEMI (non-ST elevated myocardial infarction) ([place]) (01/15/2019), and Tobacco abuse.   Surgical History    Past Surgical History:  Procedure Laterality Date  . CORONARY STENT INTERVENTION N/A 01/16/2019   Procedure: CORONARY STENT INTERVENTION;  Surgeon: Burnell Blanks, MD;  Location: [facility];  Service: Cardiovascular;  Laterality: N/A;  . EYE SURGERY Left 1983   glass remmoved  . FOOT FRACTURE SURGERY Right   . LEFT HEART CATH AND CORONARY ANGIOGRAPHY N/A 01/16/2019   Procedure: LEFT HEART CATH AND CORONARY ANGIOGRAPHY;  Surgeon: Burnell Blanks, MD;  Location: [facility];  Service: Cardiovascular;  Laterality: N/A;     Social History   reports that he has been smoking cigarettes. He has a 33.75 pack-year smoking history. He has never used smokeless tobacco. He reports current alcohol use of about 6.0 standard drinks of alcohol per week. He reports that he does not use drugs.   Family history   His family history includes Cancer in his father and mother;  Stroke in his mother.   Allergies No Known Allergies   Home meds  Prior to Admission medications   Medication Sig Start Date End Date Taking? Authorizing Provider  aspirin EC 81 MG tablet Take 1 tablet (81 mg total) by mouth daily. 06/11/19  Yes Weaver, Scott T, PA-C  metoprolol tartrate (LOPRESSOR) 25 MG tablet Take 1 tablet (25 mg total) by mouth 2 (two) times daily. 05/22/19  Yes Sherren Mocha, MD  Multiple Vitamin (MULTIVITAMIN) tablet Take 1 tablet by mouth daily.   Yes [provider]  nitroGLYCERIN (NITROSTAT) 0.4 MG SL tablet Place 1 tablet (0.4 mg total) under the tongue every 5 (five) minutes x 3 doses as needed for chest pain. 05/22/19  Yes Sherren Mocha, MD  rosuvastatin (CRESTOR) 40 MG tablet Take 1 tablet (40 mg total) by mouth daily. Patient taking differently: Take 40 mg by mouth daily at 6 PM.  05/22/19  Yes Sherren Mocha, MD  varenicline (CHANTIX STARTING MONTH PAK) 0.5 MG X 11 & 1 MG X 42 tablet Take one 0.5 mg tablet by mouth once daily for 3 days, then increase to one 0.5 mg tablet twice daily for 4 days, then increase to one 1 mg tablet twice daily. Patient taking differently: Take 1 mg by mouth 2 (two) times daily. Not taking 05/08/19  Yes Pleas Koch, NP  clopidogrel (PLAVIX) 75 MG tablet Take 1 tablet (75 mg total) by mouth daily. 05/30/19   Leanor Kail, PA    Critical care time: 45 min.    Renee Rival, MS4

## 2019-06-19 DEATH — deceased

## 2019-06-20 ENCOUNTER — Inpatient Hospital Stay (HOSPITAL_COMMUNITY): Payer: Medicare Other

## 2019-06-20 DIAGNOSIS — G9340 Encephalopathy, unspecified: Secondary | ICD-10-CM

## 2019-06-20 DIAGNOSIS — I952 Hypotension due to drugs: Secondary | ICD-10-CM

## 2019-06-20 LAB — TYPE AND SCREEN
ABO/RH(D): A NEG
Antibody Screen: NEGATIVE
Unit division: 0
Unit division: 0
Unit division: 0
Unit division: 0
Unit division: 0
Unit division: 0
Weak D: POSITIVE

## 2019-06-20 LAB — COMPREHENSIVE METABOLIC PANEL
ALT: 446 U/L — ABNORMAL HIGH (ref 0–44)
AST: 1150 U/L — ABNORMAL HIGH (ref 15–41)
Albumin: 2.5 g/dL — ABNORMAL LOW (ref 3.5–5.0)
Alkaline Phosphatase: 37 U/L — ABNORMAL LOW (ref 38–126)
Anion gap: 11 (ref 5–15)
BUN: 41 mg/dL — ABNORMAL HIGH (ref 8–23)
CO2: 21 mmol/L — ABNORMAL LOW (ref 22–32)
Calcium: 6.9 mg/dL — ABNORMAL LOW (ref 8.9–10.3)
Chloride: 110 mmol/L (ref 98–111)
Creatinine, Ser: 2.22 mg/dL — ABNORMAL HIGH (ref 0.61–1.24)
GFR calc Af Amer: 35 mL/min — ABNORMAL LOW (ref >60)
GFR calc non Af Amer: 30 mL/min — ABNORMAL LOW (ref >60)
Glucose, Bld: 119 mg/dL — ABNORMAL HIGH (ref 70–99)
Potassium: 4.3 mmol/L (ref 3.5–5.1)
Sodium: 142 mmol/L (ref 135–145)
Total Bilirubin: 3 mg/dL — ABNORMAL HIGH (ref 0.3–1.2)
Total Protein: 4.4 g/dL — ABNORMAL LOW (ref 6.5–8.1)

## 2019-06-20 LAB — POCT ACTIVATED CLOTTING TIME
Activated Clotting Time: 246 seconds
Activated Clotting Time: 208 seconds
Activated Clotting Time: 125 seconds

## 2019-06-20 LAB — BPAM PLATELET PHERESIS
Blood Product Expiration Date: 202010022359
ISSUE DATE / TIME: 202010011029
Unit Type and Rh: 6200

## 2019-06-20 LAB — PREPARE PLATELET PHERESIS: Unit division: 0

## 2019-06-20 LAB — POCT I-STAT 7, (LYTES, BLD GAS, ICA,H+H)
Acid-base deficit: 3 mmol/L — ABNORMAL HIGH (ref 0.0–2.0)
Bicarbonate: 20.3 mmol/L (ref 20.0–28.0)
Calcium, Ion: 1.02 mmol/L — ABNORMAL LOW (ref 1.15–1.40)
HCT: 29 % — ABNORMAL LOW (ref 39.0–52.0)
Hemoglobin: 9.9 g/dL — ABNORMAL LOW (ref 13.0–17.0)
O2 Saturation: 93 %
Patient temperature: 98
Potassium: 4.2 mmol/L (ref 3.5–5.1)
Sodium: 142 mmol/L (ref 135–145)
TCO2: 21 mmol/L — ABNORMAL LOW (ref 22–32)
pCO2 arterial: 30 mmHg — ABNORMAL LOW (ref 32.0–48.0)
pH, Arterial: 7.437 (ref 7.350–7.450)
pO2, Arterial: 62 mmHg — ABNORMAL LOW (ref 83.0–108.0)

## 2019-06-20 LAB — BPAM RBC
Blood Product Expiration Date: 202010212359
Blood Product Expiration Date: 202010212359
Blood Product Expiration Date: 202010212359
Blood Product Expiration Date: 202010222359
Blood Product Expiration Date: 202010222359
Blood Product Expiration Date: 202010222359
ISSUE DATE / TIME: 202009301309
ISSUE DATE / TIME: 202009301309
ISSUE DATE / TIME: 202009301409
ISSUE DATE / TIME: 202009301409
Unit Type and Rh: 600
Unit Type and Rh: 600
Unit Type and Rh: 600
Unit Type and Rh: 600
Unit Type and Rh: 600
Unit Type and Rh: 600

## 2019-06-20 LAB — CBC
HCT: 33.8 % — ABNORMAL LOW (ref 39.0–52.0)
Hemoglobin: 11 g/dL — ABNORMAL LOW (ref 13.0–17.0)
MCH: 30.7 pg (ref 26.0–34.0)
MCHC: 32.5 g/dL (ref 30.0–36.0)
MCV: 94.4 fL (ref 80.0–100.0)
Platelets: 70 10*3/uL — ABNORMAL LOW (ref 150–400)
RBC: 3.58 MIL/uL — ABNORMAL LOW (ref 4.22–5.81)
RDW: 14.8 % (ref 11.5–15.5)
WBC: 17.9 10*3/uL — ABNORMAL HIGH (ref 4.0–10.5)
nRBC: 0.1 % (ref 0.0–0.2)

## 2019-06-20 LAB — GLUCOSE, CAPILLARY
Glucose-Capillary: 105 mg/dL — ABNORMAL HIGH (ref 70–99)
Glucose-Capillary: 111 mg/dL — ABNORMAL HIGH (ref 70–99)
Glucose-Capillary: 112 mg/dL — ABNORMAL HIGH (ref 70–99)
Glucose-Capillary: 112 mg/dL — ABNORMAL HIGH (ref 70–99)
Glucose-Capillary: 71 mg/dL (ref 70–99)
Glucose-Capillary: 84 mg/dL (ref 70–99)
Glucose-Capillary: 95 mg/dL (ref 70–99)

## 2019-06-20 LAB — MAGNESIUM
Magnesium: 1.3 mg/dL — ABNORMAL LOW (ref 1.7–2.4)
Magnesium: 1.4 mg/dL — ABNORMAL LOW (ref 1.7–2.4)

## 2019-06-20 LAB — PHOSPHORUS
Phosphorus: 4.7 mg/dL — ABNORMAL HIGH (ref 2.5–4.6)
Phosphorus: 4 mg/dL (ref 2.5–4.6)

## 2019-06-20 LAB — BASIC METABOLIC PANEL
Anion gap: 11 (ref 5–15)
BUN: 49 mg/dL — ABNORMAL HIGH (ref 8–23)
CO2: 20 mmol/L — ABNORMAL LOW (ref 22–32)
Calcium: 6.6 mg/dL — ABNORMAL LOW (ref 8.9–10.3)
Chloride: 110 mmol/L (ref 98–111)
Creatinine, Ser: 2.07 mg/dL — ABNORMAL HIGH (ref 0.61–1.24)
GFR calc Af Amer: 38 mL/min — ABNORMAL LOW (ref >60)
GFR calc non Af Amer: 33 mL/min — ABNORMAL LOW (ref >60)
Glucose, Bld: 110 mg/dL — ABNORMAL HIGH (ref 70–99)
Potassium: 3.9 mmol/L (ref 3.5–5.1)
Sodium: 141 mmol/L (ref 135–145)

## 2019-06-20 MED ORDER — QUETIAPINE FUMARATE 25 MG PO TABS
25.0000 mg | ORAL_TABLET | Freq: Two times a day (BID) | ORAL | Status: DC
  Administered 2019-06-20 – 2019-06-27 (×15): 25 mg via ORAL
  Filled 2019-06-20 (×15): qty 1

## 2019-06-20 MED ORDER — VITAL AF 1.2 CAL PO LIQD
1000.0000 mL | ORAL | Status: DC
  Administered 2019-06-20 – 2019-06-21 (×2): 1000 mL

## 2019-06-20 MED ORDER — VITAL HIGH PROTEIN PO LIQD
1000.0000 mL | ORAL | Status: DC
  Administered 2019-06-20: 09:00:00 1000 mL

## 2019-06-20 MED ORDER — VANCOMYCIN HCL 10 G IV SOLR
2000.0000 mg | Freq: Once | INTRAVENOUS | Status: AC
  Administered 2019-06-20: 2000 mg via INTRAVENOUS
  Filled 2019-06-20 (×2): qty 2000

## 2019-06-20 MED ORDER — VANCOMYCIN HCL IN DEXTROSE 1-5 GM/200ML-% IV SOLN
1000.0000 mg | INTRAVENOUS | Status: DC
  Administered 2019-06-21 – 2019-06-22 (×2): 1000 mg via INTRAVENOUS
  Filled 2019-06-20 (×2): qty 200

## 2019-06-20 MED ORDER — FENTANYL 2500MCG IN NS 250ML (10MCG/ML) PREMIX INFUSION
0.0000 ug/h | INTRAVENOUS | Status: DC
  Administered 2019-06-21: 200 ug/h via INTRAVENOUS
  Administered 2019-06-22: 50 ug/h via INTRAVENOUS
  Administered 2019-06-22: 12:00:00 25 ug/h via INTRAVENOUS
  Administered 2019-06-23: 100 ug/h via INTRAVENOUS
  Administered 2019-06-24: 175 ug/h via INTRAVENOUS
  Administered 2019-06-24: 15:00:00 100 ug/h via INTRAVENOUS
  Administered 2019-06-25: 75 ug/h via INTRAVENOUS
  Administered 2019-06-26 (×2): 200 ug/h via INTRAVENOUS
  Administered 2019-06-27: 150 ug/h via INTRAVENOUS
  Filled 2019-06-20 (×11): qty 250

## 2019-06-20 MED ORDER — DEXTROSE 50 % IV SOLN
INTRAVENOUS | Status: AC
  Filled 2019-06-20: qty 50

## 2019-06-20 MED ORDER — FENTANYL BOLUS VIA INFUSION
25.0000 ug | INTRAVENOUS | Status: DC | PRN
  Administered 2019-06-20 – 2019-06-27 (×10): 25 ug via INTRAVENOUS
  Filled 2019-06-20: qty 25

## 2019-06-20 MED ORDER — MIDAZOLAM HCL 2 MG/2ML IJ SOLN
1.0000 mg | INTRAMUSCULAR | Status: AC | PRN
  Administered 2019-06-21 – 2019-06-25 (×3): 1 mg via INTRAVENOUS
  Filled 2019-06-20 (×4): qty 2

## 2019-06-20 MED ORDER — PIPERACILLIN-TAZOBACTAM 3.375 G IVPB
3.3750 g | Freq: Three times a day (TID) | INTRAVENOUS | Status: DC
  Administered 2019-06-20 – 2019-06-26 (×17): 3.375 g via INTRAVENOUS
  Filled 2019-06-20 (×10): qty 50

## 2019-06-20 MED ORDER — PRO-STAT SUGAR FREE PO LIQD
30.0000 mL | Freq: Two times a day (BID) | ORAL | Status: DC
  Administered 2019-06-20: 30 mL
  Filled 2019-06-20: qty 30

## 2019-06-20 MED ORDER — PIPERACILLIN-TAZOBACTAM 3.375 G IVPB 30 MIN
3.3750 g | Freq: Once | INTRAVENOUS | Status: AC
  Administered 2019-06-20: 14:00:00 3.375 g via INTRAVENOUS
  Filled 2019-06-20 (×2): qty 50

## 2019-06-20 MED ORDER — LACTATED RINGERS IV BOLUS
500.0000 mL | Freq: Once | INTRAVENOUS | Status: AC
  Administered 2019-06-20: 500 mL via INTRAVENOUS

## 2019-06-20 MED ORDER — MIDAZOLAM HCL 2 MG/2ML IJ SOLN
1.0000 mg | INTRAMUSCULAR | Status: DC | PRN
  Administered 2019-06-21 – 2019-06-26 (×4): 1 mg via INTRAVENOUS
  Filled 2019-06-20 (×4): qty 2

## 2019-06-20 MED ORDER — PRO-STAT SUGAR FREE PO LIQD
30.0000 mL | Freq: Every day | ORAL | Status: DC
  Administered 2019-06-21 – 2019-06-24 (×4): 30 mL
  Filled 2019-06-20 (×4): qty 30

## 2019-06-20 MED ORDER — FENTANYL CITRATE (PF) 100 MCG/2ML IJ SOLN
25.0000 ug | Freq: Once | INTRAMUSCULAR | Status: AC
  Administered 2019-06-20: 19:00:00 25 ug via INTRAVENOUS

## 2019-06-20 MED ORDER — MIDAZOLAM BOLUS VIA INFUSION
1.0000 mg | INTRAVENOUS | Status: DC | PRN
  Filled 2019-06-20: qty 2

## 2019-06-20 MED FILL — Heparin Sodium (Porcine) Inj 1000 Unit/ML: INTRAMUSCULAR | Qty: 60 | Status: AC

## 2019-06-20 MED FILL — Sodium Chloride Irrigation Soln 0.9%: Qty: 6000 | Status: AC

## 2019-06-20 MED FILL — Perflutren Lipid Microsphere IV Susp 1.1 MG/ML: INTRAVENOUS | Qty: 10 | Status: AC

## 2019-06-20 MED FILL — Sodium Chloride IV Soln 0.9%: INTRAVENOUS | Qty: 2000 | Status: AC

## 2019-06-20 NOTE — Progress Notes (Signed)
OT Cancellation Note  Patient Details Name: Lawrence Wallace MRN: [id number] DOB: 08-30-1956   Cancelled Treatment:    Reason Eval/Treat Not Completed: Other (comment) Still has Swan, hypotensive and not medically ready per nsg [place], OTR/L Acute Rehab Services Pager [phone] Office [phone]     Almon Register 06/20/2019, 9:26 AM

## 2019-06-20 NOTE — Progress Notes (Signed)
NAME:  Lawrence Wallace, MRN:  [id number], DOB:  05/15/1956, LOS: 2 ADMISSION DATE:  06/17/2019, CONSULTATION DATE:  06/12/2019 REFERRING MD:  Carlis Abbott  CHIEF COMPLAINT:  Vent Management   Brief History   PRESTAN BRAND is a 63 y.o. male who was admitted 9/30 for repair of large AAA.  He had significant intraoperative blood loss with subsequent hypotension requiring 5L IVF'Lawrence, 2u PRBC, 2 FFP, 2.5L from cellsaver, and neosynephrine.  Upon leaving PACU, he was somewhat hypertensive and was started on cleviprex.  History of present illness   Pt is encephelopathic; therefore, this HPI is obtained from chart review. Lawrence Wallace is a 63 y.o. male who has a PMH including but not limited to tobacco, abuse, AAA, NSTEMI, HTN, HLD, CAD, skin CA, GERD.  He presented to [facility] 9/30 for repair of 7cm AAA and 5cm R common iliac artery aneurysm.  He had significant blood loss intraoperatively and required 5L IVF'Lawrence, 2u PRBC, 2 FFP, 2.5L cell saver, and neosynephrine.  Just prior to leaving PACU, he was noted to be hypertensive and was started on cleviprex.  Neosynephrine was discontinued upon arrival to ICU and cleviprex is being titrated down.  Past Medical History  tobacco, abuse, AAA, NSTEMI, HTN, HLD, CAD, skin CA, GERD.  Significant Hospital Events   9/30 > admitted for OR repair of 7cm AAA and 5cm R common iliac artery aneurysm.  Consults:  PCCM.  Procedures:  ETT 9/30 >  L rad art line 9/30 >  R swan 9/30 >   Significant Diagnostic Tests:  TTE 06/19/19 - EF 60-65% No WMA or valvular abnormalities  Micro Data:  None.  Antimicrobials:  None.   Interim history/subjective:  Briefly weaned off vasopressor support yesterday however restarted after increasing sedation after worsening agitation  Objective:  Blood pressure 115/70, pulse 91, temperature 99.5 F (37.5 C), resp. rate (!) 22, height 6\' 2"  (1.88 m), weight 104.3 kg, SpO2 90 %. PAP: (21-46)/(11-21) 22/14 CVP:  [7 mmHg-14 mmHg] 8 mmHg  Vent Mode:  PRVC FiO2 (%):  [40 %] 40 % Set Rate:  [20 bmp] 20 bmp Vt Set:  [640 mL-650 mL] 650 mL PEEP:  [5 cmH20] 5 cmH20 Plateau Pressure:  [18 cmH20-25 cmH20] 24 cmH20   Intake/Output Summary (Last 24 hours) at 06/20/2019 0842 Last data filed at 06/20/2019 0700 Gross per 24 hour  Intake 4967.35 ml  Output 735 ml  Net 4232.35 ml   Filed Weights   06/02/2019 0828 06/20/19 0445  Weight: 92.9 kg 104.3 kg   Physical Exam: General: Chronically ill-appearing, sedated HENT: [place], AT, ETT in place Eyes: EOMI, no scleral icterus Respiratory: Diminished breath sounds bilaterally. No crackles, wheezing or rales Cardiovascular: RRR, -M/R/G, no JVD GI: BS+, soft, nontender Extremities:-Edema,-tenderness, cool lower extremities, distal pulses palpable Neuro: Sedated Skin: Pale mottled skin in lower extremities, improved GU: Condom cath in place  Assessment & Plan:  63 year old male with AAA and bilateral common iliac aneurysms Lawrence/p elective open AAA repair with intra-op EBL 6600 ml. He was resuscitated with 5.2L LR, 2.5L cellsaver, 2U PRBC and 2U FFP.   Hemorrhagic Shock - resolved Hypotension secondary to sedation -Wean levophed and vasopressin for goal MAP >65 -Wean Versed -Trend CBC. Transfuse for Hg goal >7 -Obtain blood cultures. Start antibiotics if patient clinically worsens or becomes febrile  Agitation requiring sedation -RASS goal 0 and -1 -Wean Versed off -Continue Fentanyl and Precedex -Start seroquel  Acute respiratory failure -Extubation precluded by mental status and  hemodynamic instability -ABG now -CXR -Wean FIO2/PEEP. Wean as tolerated -VAP  AKI, oliguric. Likely ATN in setting of shock -Insert foley for critical illness and close UOP monitoring -Trend BMP  Incidental AAA Lawrence/p open repair  -7.4cm infrarenal aneurysm, EBL 6 units with 2.5 units returned via cell-saver  -Was hypotensive peri-operatively and started on Neo, but then hypertensive post-op and initiated on  Cleviprex -post op plan per vascular  Transaminitis -Likely ischemic hepatitis -Trend CMP  Acute blood loss anemia Post-op thrombocytopenia -Lawrence/p 1U Plt for goal > 50K -Trend CBC  Hx HTN, CAD, NSTEMI -hold home lopressor, plavix, Asa and statin  Best Practice:  Diet: Start TF Pain/Anxiety/Delirium protocol (if indicated): Precedex gtt / fentanyl PRN.  RASS goal -1. Wean Versed VAP protocol (if indicated): In place. DVT prophylaxis: SCD'Lawrence. GI prophylaxis: PPI. Glucose control: SSI if glucose consistently > 180. Mobility: Bedrest. Code Status: Full. Family Communication: Per primary team Disposition: ICU  Labs   CBC: Recent Labs  Lab 06/16/19 0858  06/17/2019 1651  06/19/19 0000 06/19/19 0006 06/19/19 0151 06/19/19 0506 06/19/19 0806 06/20/19 0326  WBC 8.2  --  12.3*  --  24.1*  --   --   --  14.9* 17.9*  NEUTROABS  --   --   --   --   --   --   --   --  11.6*  --   HGB 17.1*   < > 14.3   < > 15.5 16.0 12.9* 13.3 11.9* 11.0*  HCT 52.5*   < > 44.3   < > 48.7 47.0 38.0* 39.0 36.1* 33.8*  MCV 94.9  --  96.1  --  96.8  --   --   --  93.0 94.4  PLT 203  --  PLATELET CLUMPS NOTED ON SMEAR, UNABLE TO ESTIMATE  --  106*  --   --   --  55* 70*   < > = values in this interval not displayed.   Basic Metabolic Panel: Recent Labs  Lab 05/23/2019 1651  06/02/2019 2108  06/19/19 0000 06/19/19 0006 06/19/19 0151 06/19/19 0506 06/19/19 0806 06/20/19 0326  NA 139   < > 139   < > 136 139 140 142 141 142  K 5.2*   < > 5.8*   < > 6.6* 6.6* 5.1 4.9 5.1 4.3  CL 105  --  108  --  108  --   --   --  106 110  CO2 23  --  16*  --  16*  --   --   --  24 21*  GLUCOSE 253*  --  188*  --  173*  --   --   --  146* 119*  BUN 13  --  15  --  18  --   --   --  26* 41*  CREATININE 1.30*  --  1.76*  --  2.05*  --   --   --  2.16* 2.22*  CALCIUM 8.2*  --  8.0*  --  8.0*  --   --   --  7.8* 6.9*  MG 1.4*  --   --   --  1.7  --   --   --  1.5*  --   PHOS 6.6*  --   --   --   --   --   --   --   5.5*  --    < > = values in this interval not displayed.  GFR: Estimated Creatinine Clearance: 42.7 mL/min (A) (by C-G formula based on SCr of 2.22 mg/dL (H)). Recent Labs  Lab 05/29/2019 1651 06/19/19 0000 06/19/19 0806 06/19/19 0807 06/20/19 0326  WBC 12.3* 24.1* 14.9*  --  17.9*  LATICACIDVEN  --   --   --  3.2*  --    Liver Function Tests: Recent Labs  Lab 06/16/19 0858 06/11/2019 1651 06/19/19 0000 06/19/19 0806 06/20/19 0326  AST 28 134* 364* 1,432* 1,150*  ALT 24 95* 198* 511* 446*  ALKPHOS 60 37* 51 35* 37*  BILITOT 1.2 1.5* 1.7* 2.4* 3.0*  PROT 6.9 4.1* 4.7* 4.4* 4.4*  ALBUMIN 3.8 2.5* 2.6* 3.1* 2.5*   Recent Labs  Lab 06/19/19 0000  AMYLASE 485*   No results for input(Lawrence): AMMONIA in the last 168 hours. ABG    Component Value Date/Time   PHART 7.360 06/19/2019 0506   PCO2ART 42.0 06/19/2019 0506   PO2ART 111.0 (H) 06/19/2019 0506   HCO3 23.5 06/19/2019 0506   TCO2 25 06/19/2019 0506   ACIDBASEDEF 2.0 06/19/2019 0506   O2SAT 98.0 06/19/2019 0506    Coagulation Profile: Recent Labs  Lab 06/16/19 0858 05/21/2019 1651  INR 1.0 1.5*   Cardiac Enzymes: No results for input(Lawrence): CKTOTAL, CKMB, CKMBINDEX, TROPONINI in the last 168 hours. HbA1C: Hgb A1c MFr Bld  Date/Time Value Ref Range Status  12/10/2017 09:20 AM 5.9 4.6 - 6.5 % Final    Comment:    Glycemic Control Guidelines for People with Diabetes:Non Diabetic:  <6%Goal of Therapy: <7%Additional Action Suggested:  >8%   06/29/2016 08:11 AM 5.7 4.6 - 6.5 % Final    Comment:    Glycemic Control Guidelines for People with Diabetes:Non Diabetic:  <6%Goal of Therapy: <7%Additional Action Suggested:  >8%    CBG: Recent Labs  Lab 06/19/19 1944 06/19/19 2337 06/20/19 0350 06/20/19 0401  GLUCAP 92 101* 71 105*    The patient is critically ill with multiple organ systems failure and requires high complexity decision making for assessment and support, frequent evaluation and titration of therapies,  application of advanced monitoring technologies and extensive interpretation of multiple databases.   Critical Care Time devoted to patient care services described in this note is 35 Minutes. This time reflects time of care of this signee Dr. Rodman Pickle. This critical care time does not reflect procedure time, or teaching time or supervisory time of PA/NP/Med student/Med Resident etc but could involve care discussion time.  Rodman Pickle, M.D. [facility] Pulmonary/Critical Care Medicine 06/20/2019 8:42 AM  Pager: [phone] After hours pager: [phone]

## 2019-06-20 NOTE — Progress Notes (Signed)
PT Cancellation Note  Patient Details Name: NEY TRIPODI MRN: [id number] DOB: 1956-02-10   Cancelled Treatment:    Reason Eval/Treat Not Completed: Medical issues which prohibited therapy;Other (comment)(Still has Swan, hypotensive and not medically ready per nsg)   Denice Paradise 06/20/2019, 8:27 AM [place] Pager:  [phone]  Office:  [phone]

## 2019-06-20 NOTE — Progress Notes (Signed)
NAME:  Lawrence Wallace, MRN:  [id number], DOB:  05/29/1956, LOS: 2 ADMISSION DATE:  06/15/2019, CONSULTATION DATE:  06/17/2019 REFERRING MD:  Carlis Abbott  CHIEF COMPLAINT:  Vent Management   Brief History   Lawrence Wallace is a 63 y.o. male who was admitted 9/30 for repair of large AAA.  He had significant intraoperative blood loss with subsequent hypotension requiring 5L IVF's, 2u PRBC, 2 FFP, 2.5L from cellsaver, and neosynephrine.  Upon leaving PACU, he was somewhat hypertensive and was started on cleviprex.  History of present illness   Pt is encephelopathic; therefore, this HPI is obtained from chart review. Lawrence Wallace is a 63 y.o. male who has a PMH including but not limited to tobacco, abuse, AAA, NSTEMI, HTN, HLD, CAD, skin CA, GERD.  He presented to [facility] 9/30 for repair of 7cm AAA and 5cm R common iliac artery aneurysm.  He had significant blood loss intraoperatively and required 5L IVF's, 2u PRBC, 2 FFP, 2.5L cell saver, and neosynephrine.  Just prior to leaving PACU, he was noted to be hypertensive and was started on cleviprex.  Neosynephrine was discontinued upon arrival to ICU and cleviprex is being titrated down.  Past Medical History  tobacco, abuse, AAA, NSTEMI, HTN, HLD, CAD, skin CA, GERD.  Significant Hospital Events   9/30 > admitted for OR repair of 7cm AAA and 5cm R common iliac artery aneurysm.  Consults:  PCCM.  Procedures:  ETT 9/30 >  L rad art line 9/30 >  R swan 9/30 >   Significant Diagnostic Tests:  None.  Micro Data:  None.  Antimicrobials:  None.   Interim history/subjective:  Patient sedated. No acute events overnight.   Per RN: Patient is agitated and is not able to follow commands. Given precedex.  Objective:  Blood pressure (!) 110/57, pulse 91, temperature 99.3 F (37.4 C), resp. rate (!) 24, height 6\' 2"  (1.88 m), weight 104.3 kg, SpO2 92 %. PAP: (21-46)/(12-21) 23/14 CVP:  [7 mmHg-14 mmHg] 7 mmHg  Vent Mode: PRVC FiO2 (%):  [40 %] 40 % Set  Rate:  [20 bmp] 20 bmp Vt Set:  [640 mL-650 mL] 650 mL PEEP:  [5 cmH20] 5 cmH20 Plateau Pressure:  [16 cmH20-25 cmH20] 25 cmH20   Intake/Output Summary (Last 24 hours) at 06/20/2019 0754 Last data filed at 06/20/2019 0700 Gross per 24 hour  Intake 4967.35 ml  Output 785 ml  Net 4182.35 ml   Filed Weights   06/17/2019 0828 06/20/19 0445  Weight: 92.9 kg 104.3 kg   Physical Exam: General: NAD, chronically ill-appearing, asleep HENT: PERRLA, [place], AT, ETT in tube,  Eyes: PERRLA, no scleral icterus Respiratory: CTAB, diminished breath sounds at bases, no crackles, wheezes, or rales CV: RRR, no mrg, no JVD GI: BS+, soft nontender Extremities: cool below the knee, weekly palpable pulses, + pulses with doppler bilaterally Neuro: Sedated Skin: Pale, mottled skin (significantly improved)  Assessment & Plan:  63 year old male with AAA and bilateral common iliac aneurysms s/p elective open AAA repair with intra-op EBL 6600 ml. He was resuscitated with 5.2L LR, 2.5L cellsaver, 2U PRBC and 2U FFP.   Hypotensive/Hemorrhagic Shock - improved, systolic BP 0000000, CBC stable, afebrile -Wean Levophed and Vasopressin for goal MAP >65 -Trend CBC. Transfuse for Hg goal >7 -If febrile, low threshold to culture and start antibiotics  Incidental AAA s/p open repair  -7.4cm infrarenal aneurysm, EBL 6 units with 2.5 units returned via cell-saver  -Was hypotensive peri-operatively and started on  Neo, but then hypertensive post-op and initiated on Cleviprex  P: -post op plan per vascular -Off cleviprex -goal maintain normotension (SBP 100-140)  Acute Respiratory Insufficiency secondary to above: At risk for TRALI/TACO  P: - Full vent support.  - Continue to wean sedation and reassess for SBT/extubation  - VAP bundle  - Precedex and Fentanyl for Rass goal -1  AKI: likely pre-renal. No acute indication for dialysis. Cr stable (10/2 2.16<--10/1 2.22), Improved UOP  P: -Trend CMP -Monitor UOP   Transaminitis: likely ischemic hepatitis. Improving LFTs (10/2 AST 1150<--1432, ALT 446<--511)  P: -Trend CMP  Leukocytosis: WBC trending up (10/2 17.9<--10/1 14.9), afebrile - Trend CBC  Acute blood loss anemia Post-op thrombocytopenia - platelets stable (10/2 70)  P: - Trend BMP - Transfuse 1U Plt for goal > 50K  Hx HTN, CAD, NSTEMI P:  - hold home lopressor, plavix, Asa and statin   Best Practice:  Diet: NPO. Pain/Anxiety/Delirium protocol (if indicated): Precedex gtt / fentanyl PRN.  RASS goal -1. VAP protocol (if indicated): In place. DVT prophylaxis: SCD's. GI prophylaxis: PPI. Glucose control: SSI if glucose consistently > 180. Mobility: Bedrest. Code Status: Full. Family Communication: None available. Disposition: ICU  Labs   CBC: Recent Labs  Lab 06/16/19 0858  05/25/2019 1651  06/19/19 0000 06/19/19 0006 06/19/19 0151 06/19/19 0506 06/19/19 0806 06/20/19 0326  WBC 8.2  --  12.3*  --  24.1*  --   --   --  14.9* 17.9*  NEUTROABS  --   --   --   --   --   --   --   --  11.6*  --   HGB 17.1*   < > 14.3   < > 15.5 16.0 12.9* 13.3 11.9* 11.0*  HCT 52.5*   < > 44.3   < > 48.7 47.0 38.0* 39.0 36.1* 33.8*  MCV 94.9  --  96.1  --  96.8  --   --   --  93.0 94.4  PLT 203  --  PLATELET CLUMPS NOTED ON SMEAR, UNABLE TO ESTIMATE  --  106*  --   --   --  55* 70*   < > = values in this interval not displayed.   Basic Metabolic Panel: Recent Labs  Lab 05/31/2019 1651  06/13/2019 2108  06/19/19 0000 06/19/19 0006 06/19/19 0151 06/19/19 0506 06/19/19 0806 06/20/19 0326  NA 139   < > 139   < > 136 139 140 142 141 142  K 5.2*   < > 5.8*   < > 6.6* 6.6* 5.1 4.9 5.1 4.3  CL 105  --  108  --  108  --   --   --  106 110  CO2 23  --  16*  --  16*  --   --   --  24 21*  GLUCOSE 253*  --  188*  --  173*  --   --   --  146* 119*  BUN 13  --  15  --  18  --   --   --  26* 41*  CREATININE 1.30*  --  1.76*  --  2.05*  --   --   --  2.16* 2.22*  CALCIUM 8.2*  --  8.0*  --   8.0*  --   --   --  7.8* 6.9*  MG 1.4*  --   --   --  1.7  --   --   --  1.5*  --   PHOS 6.6*  --   --   --   --   --   --   --  5.5*  --    < > = values in this interval not displayed.   GFR: Estimated Creatinine Clearance: 42.7 mL/min (A) (by C-G formula based on SCr of 2.22 mg/dL (H)). Recent Labs  Lab 06/14/2019 1651 06/19/19 0000 06/19/19 0806 06/19/19 0807 06/20/19 0326  WBC 12.3* 24.1* 14.9*  --  17.9*  LATICACIDVEN  --   --   --  3.2*  --    Liver Function Tests: Recent Labs  Lab 06/16/19 0858 05/21/2019 1651 06/19/19 0000 06/19/19 0806 06/20/19 0326  AST 28 134* 364* 1,432* 1,150*  ALT 24 95* 198* 511* 446*  ALKPHOS 60 37* 51 35* 37*  BILITOT 1.2 1.5* 1.7* 2.4* 3.0*  PROT 6.9 4.1* 4.7* 4.4* 4.4*  ALBUMIN 3.8 2.5* 2.6* 3.1* 2.5*   Recent Labs  Lab 06/19/19 0000  AMYLASE 485*   No results for input(s): AMMONIA in the last 168 hours. ABG    Component Value Date/Time   PHART 7.360 06/19/2019 0506   PCO2ART 42.0 06/19/2019 0506   PO2ART 111.0 (H) 06/19/2019 0506   HCO3 23.5 06/19/2019 0506   TCO2 25 06/19/2019 0506   ACIDBASEDEF 2.0 06/19/2019 0506   O2SAT 98.0 06/19/2019 0506    Coagulation Profile: Recent Labs  Lab 06/16/19 0858 06/10/2019 1651  INR 1.0 1.5*   Cardiac Enzymes: No results for input(s): CKTOTAL, CKMB, CKMBINDEX, TROPONINI in the last 168 hours. HbA1C: Hgb A1c MFr Bld  Date/Time Value Ref Range Status  12/10/2017 09:20 AM 5.9 4.6 - 6.5 % Final    Comment:    Glycemic Control Guidelines for People with Diabetes:Non Diabetic:  <6%Goal of Therapy: <7%Additional Action Suggested:  >8%   06/29/2016 08:11 AM 5.7 4.6 - 6.5 % Final    Comment:    Glycemic Control Guidelines for People with Diabetes:Non Diabetic:  <6%Goal of Therapy: <7%Additional Action Suggested:  >8%    CBG: Recent Labs  Lab 06/19/19 1944 06/19/19 2337 06/20/19 0350 06/20/19 0401  GLUCAP 92 101* 71 105*    The patient is critically ill with multiple organ systems  failure and requires high complexity decision making for assessment and support, frequent evaluation and titration of therapies, application of advanced monitoring technologies and extensive interpretation of multiple databases.   Critical Care Time devoted to patient care services described in this note is X Minutes. Plan to wean off sedation and pressors. Reassess for SBT/extubation. Renee Rival, MS4

## 2019-06-20 NOTE — Progress Notes (Signed)
[facility] [place]  Subjective  -remains intubated.  Postop day 2 status post open abdominal aortic and right common iliac aneurysm repair with bifurcated graft.  More stable yesterday, pressors weaned.   Objective 115/70 91 99.5 F (37.5 C) (!) 22 90%  Intake/Output Summary (Last 24 hours) at 06/20/2019 0859 Last data filed at 06/20/2019 0800 Gross per 24 hour  Intake 4967.35 ml  Output 760 ml  Net 4207.35 ml   Intubated, sedated, GCS 3 T Midline incision dressing clean and dry and intact Femoral pulses are palpable bilaterally Lower extremity mottling is improved except for the feet - feet are improving today He has weakly palpable dorsalis pedis pulses bilaterally Confirmed with doppler very brisk posterior tibial and dorsalis pedis signals bilaterally  Laboratory Lab Results: Recent Labs    06/19/19 0806 06/20/19 0326  WBC 14.9* 17.9*  HGB 11.9* 11.0*  HCT 36.1* 33.8*  PLT 55* 70*   BMET Recent Labs    06/19/19 0806 06/20/19 0326  NA 141 142  K 5.1 4.3  CL 106 110  CO2 24 21*  GLUCOSE 146* 119*  BUN 26* 41*  CREATININE 2.16* 2.22*  CALCIUM 7.8* 6.9*    COAG Lab Results  Component Value Date   INR 1.5 (H) 05/31/2019   INR 1.0 06/16/2019   No results found for: PTT  Assessment/Planning:  Postop day 2 status post open aortic aneurysm repair as well as right common iliac aneurysm repair with bifurcated dacron graft.  Remains intubated in the ICU critically ill.  Neuro: GCS 3 T this morning and intubated and sedated.  Bedside nurse reports that he was moving his lower extremities when sedation weaned. Cardiovascular: Levophed 5 mcg and vasopressin 0.03.  THemodynamically improving.  Hgb 11.  Platelets 70 after platelet transfusion. Pulmonary: Remains intubated.  Respiratory acidosis improved.  Wean to extubate per critical care. GI: N.p.o.  NG to suction. Renal: Low urine output overnight.  Did get multiple albumin challenges.   AKI at this time with rising creatinine and last creatinine 2.22. FEN: LR 75.  Last pH improved to 7 3.  Bicarb is 24. ID: Ancef for prophylaxis post-op  Appreciate critical care support. Continue to wean pressors.  Try to extubate.  [name], MD [facility] [place] Office: [phone] Pager: [phone]   [name] 06/20/2019 8:59 AM --

## 2019-06-20 NOTE — Progress Notes (Signed)
Pharmacy Antibiotic Note  Lawrence Wallace is a 62 y.o. male s/p AAA repair with VDRF and shock. Pharmacy has been consulted for vancomycin and zosyn  For r/o sepsis -WBC= 17.9, tmax= 100.6, CXR w/ increasing infilt -SCr= 2.22 (up, baseline ~ 0.8)  Plan: -zosyn 3.375 IV q8h -vanc 2000mg  IV followed by 1000mg  IV q24h (estimated AUC= 528, SCr= 2.22) -Will follow renal function, cultures and clinical progress   Height: 6\' 2"  (188 cm) Weight: 229 lb 15 oz (104.3 kg) IBW/kg (Calculated) : 82.2  Temp (24hrs), Avg:99.5 F (37.5 C), Min:98.1 F (36.7 C), Max:100.6 F (38.1 C)  Recent Labs  Lab 06/16/19 0858 05/20/2019 1651 05/25/2019 2108 06/19/19 0000 06/19/19 0806 06/19/19 0807 06/20/19 0326  WBC 8.2 12.3*  --  24.1* 14.9*  --  17.9*  CREATININE 0.80 1.30* 1.76* 2.05* 2.16*  --  2.22*  LATICACIDVEN  --   --   --   --   --  3.2*  --     Estimated Creatinine Clearance: 42.7 mL/min (A) (by C-G formula based on SCr of 2.22 mg/dL (H)).    No Known Allergies  Antimicrobials this admission: 10/2 vanc 10/2 zosyn  Dose adjustments this admission:   Microbiology results: 10/2 blood x2  Thank you for allowing pharmacy to be a part of this patient's care.  Hildred Laser, PharmD Clinical Pharmacist **Pharmacist phone directory can now be found on [email or web address] ([email or web address]).  Listed under [place].

## 2019-06-20 NOTE — Progress Notes (Signed)
Nutrition Follow-up  DOCUMENTATION CODES:   Not applicable  INTERVENTION:   Begin TF via NG tube:   Vital AF 1.2 @ 70 ml/hr (1680 ml/day)   Pro-stat 30 ml daily   Provides 2116 kcal, 141 grams of protein, and 1362 ml of H2O.   NUTRITION DIAGNOSIS:   Increased nutrient needs related to post-op healing as evidenced by estimated needs.  Ongoing  GOAL:   Patient will meet greater than or equal to 90% of their needs  Progressing   MONITOR:   Vent status, Labs, Weight trends, Skin, I & O's  REASON FOR ASSESSMENT:   Consult Enteral/tube feeding initiation and management  ASSESSMENT:   63 year old male who presented on 9/30 for repair of abdominal aortic aneurysm and right common iliac artery aneurysm. Pt with significant intraoperative blood loss with subsequent hypotension requiring multiple blood products and pressors. PMH of CAD, skin cancer, GERD, HLD, HTN, NSTEMI.  Received MD Consult for TF initiation and management. NG tube in place.  Patient remains intubated on ventilator support, on vasopressin and levophed. MV: 17 L/min Temp (24hrs), Avg:99.2 F (37.3 C), Min:98.1 F (36.7 C), Max:99.7 F (37.6 C)    Weight up with + volume status I/O +8.7 L since admission  Labs reviewed.  CBG's: 71-105  Medications reviewed and include colace, mag-sulfate. IVF: LR at 75 ml/h  Diet Order:   Diet Order            Diet NPO time specified  Diet effective now              EDUCATION NEEDS:   No education needs have been identified at this time  Skin:  Skin Assessment: Skin Integrity Issues: Skin Integrity Issues:: Incisions Incisions: closed abdomen  Last BM:  10/2 type 6  Height:   Ht Readings from Last 1 Encounters:  06/20/19 6\' 2"  (1.88 m)    Weight:   Wt Readings from Last 1 Encounters:  06/20/19 104.3 kg   05/23/2019 92.9 kg (admission weight)  Ideal Body Weight:  86.4 kg  BMI:  Body mass index is 29.52 kg/m.  Estimated  Nutritional Needs:   Kcal:  2115  Protein:  120-140 grams  Fluid:  >/= 2.0 L    Molli Barrows, RD, LDN, [place] Pager [phone] After Hours Pager [phone]

## 2019-06-21 ENCOUNTER — Inpatient Hospital Stay: Payer: Self-pay

## 2019-06-21 ENCOUNTER — Inpatient Hospital Stay (HOSPITAL_COMMUNITY): Payer: Medicare Other

## 2019-06-21 DIAGNOSIS — M62242 Nontraumatic ischemic infarction of muscle, left hand: Secondary | ICD-10-CM

## 2019-06-21 DIAGNOSIS — R451 Restlessness and agitation: Secondary | ICD-10-CM

## 2019-06-21 LAB — CBC
HCT: 28.8 % — ABNORMAL LOW (ref 39.0–52.0)
HCT: 29.5 % — ABNORMAL LOW (ref 39.0–52.0)
Hemoglobin: 9.6 g/dL — ABNORMAL LOW (ref 13.0–17.0)
Hemoglobin: 10 g/dL — ABNORMAL LOW (ref 13.0–17.0)
MCH: 31.2 pg (ref 26.0–34.0)
MCH: 31.6 pg (ref 26.0–34.0)
MCHC: 33.3 g/dL (ref 30.0–36.0)
MCHC: 33.9 g/dL (ref 30.0–36.0)
MCV: 93.5 fL (ref 80.0–100.0)
MCV: 93.4 fL (ref 80.0–100.0)
Platelets: 51 10*3/uL — ABNORMAL LOW (ref 150–400)
Platelets: 96 10*3/uL — ABNORMAL LOW (ref 150–400)
RBC: 3.08 MIL/uL — ABNORMAL LOW (ref 4.22–5.81)
RBC: 3.16 MIL/uL — ABNORMAL LOW (ref 4.22–5.81)
RDW: 14.8 % (ref 11.5–15.5)
RDW: 14.7 % (ref 11.5–15.5)
WBC: 9.8 10*3/uL (ref 4.0–10.5)
WBC: 12.5 10*3/uL — ABNORMAL HIGH (ref 4.0–10.5)
nRBC: 0.2 % (ref 0.0–0.2)
nRBC: 0.5 % — ABNORMAL HIGH (ref 0.0–0.2)

## 2019-06-21 LAB — POCT I-STAT 7, (LYTES, BLD GAS, ICA,H+H)
Acid-base deficit: 4 mmol/L — ABNORMAL HIGH (ref 0.0–2.0)
Bicarbonate: 19.5 mmol/L — ABNORMAL LOW (ref 20.0–28.0)
Bicarbonate: 24.2 mmol/L (ref 20.0–28.0)
Calcium, Ion: 1.01 mmol/L — ABNORMAL LOW (ref 1.15–1.40)
Calcium, Ion: 0.93 mmol/L — ABNORMAL LOW (ref 1.15–1.40)
HCT: 26 % — ABNORMAL LOW (ref 39.0–52.0)
HCT: 29 % — ABNORMAL LOW (ref 39.0–52.0)
Hemoglobin: 8.8 g/dL — ABNORMAL LOW (ref 13.0–17.0)
Hemoglobin: 9.9 g/dL — ABNORMAL LOW (ref 13.0–17.0)
O2 Saturation: 96 %
O2 Saturation: 97 %
Patient temperature: 38.3
Potassium: 3.6 mmol/L (ref 3.5–5.1)
Potassium: 3.2 mmol/L — ABNORMAL LOW (ref 3.5–5.1)
Sodium: 145 mmol/L (ref 135–145)
Sodium: 148 mmol/L — ABNORMAL HIGH (ref 135–145)
TCO2: 20 mmol/L — ABNORMAL LOW (ref 22–32)
TCO2: 25 mmol/L (ref 22–32)
pCO2 arterial: 32.8 mmHg (ref 32.0–48.0)
pCO2 arterial: 37.1 mmHg (ref 32.0–48.0)
pH, Arterial: 7.389 (ref 7.350–7.450)
pH, Arterial: 7.422 (ref 7.350–7.450)
pO2, Arterial: 86 mmHg (ref 83.0–108.0)
pO2, Arterial: 87 mmHg (ref 83.0–108.0)

## 2019-06-21 LAB — GLUCOSE, CAPILLARY
Glucose-Capillary: 137 mg/dL — ABNORMAL HIGH (ref 70–99)
Glucose-Capillary: 126 mg/dL — ABNORMAL HIGH (ref 70–99)
Glucose-Capillary: 115 mg/dL — ABNORMAL HIGH (ref 70–99)
Glucose-Capillary: 109 mg/dL — ABNORMAL HIGH (ref 70–99)

## 2019-06-21 LAB — COMPREHENSIVE METABOLIC PANEL
ALT: 268 U/L — ABNORMAL HIGH (ref 0–44)
AST: 412 U/L — ABNORMAL HIGH (ref 15–41)
Albumin: 1.8 g/dL — ABNORMAL LOW (ref 3.5–5.0)
Alkaline Phosphatase: 43 U/L (ref 38–126)
Anion gap: 10 (ref 5–15)
BUN: 53 mg/dL — ABNORMAL HIGH (ref 8–23)
CO2: 21 mmol/L — ABNORMAL LOW (ref 22–32)
Calcium: 6.5 mg/dL — ABNORMAL LOW (ref 8.9–10.3)
Chloride: 111 mmol/L (ref 98–111)
Creatinine, Ser: 2.15 mg/dL — ABNORMAL HIGH (ref 0.61–1.24)
GFR calc Af Amer: 36 mL/min — ABNORMAL LOW (ref >60)
GFR calc non Af Amer: 31 mL/min — ABNORMAL LOW (ref >60)
Glucose, Bld: 156 mg/dL — ABNORMAL HIGH (ref 70–99)
Potassium: 3.7 mmol/L (ref 3.5–5.1)
Sodium: 142 mmol/L (ref 135–145)
Total Bilirubin: 2.8 mg/dL — ABNORMAL HIGH (ref 0.3–1.2)
Total Protein: 4.4 g/dL — ABNORMAL LOW (ref 6.5–8.1)

## 2019-06-21 LAB — BASIC METABOLIC PANEL
Anion gap: 14 (ref 5–15)
BUN: 53 mg/dL — ABNORMAL HIGH (ref 8–23)
CO2: 24 mmol/L (ref 22–32)
Calcium: 6.5 mg/dL — ABNORMAL LOW (ref 8.9–10.3)
Chloride: 111 mmol/L (ref 98–111)
Creatinine, Ser: 2.1 mg/dL — ABNORMAL HIGH (ref 0.61–1.24)
GFR calc Af Amer: 37 mL/min — ABNORMAL LOW (ref >60)
GFR calc non Af Amer: 32 mL/min — ABNORMAL LOW (ref >60)
Glucose, Bld: 126 mg/dL — ABNORMAL HIGH (ref 70–99)
Potassium: 3.4 mmol/L — ABNORMAL LOW (ref 3.5–5.1)
Sodium: 149 mmol/L — ABNORMAL HIGH (ref 135–145)

## 2019-06-21 LAB — MAGNESIUM
Magnesium: 1.5 mg/dL — ABNORMAL LOW (ref 1.7–2.4)
Magnesium: 1.5 mg/dL — ABNORMAL LOW (ref 1.7–2.4)
Magnesium: 1.9 mg/dL (ref 1.7–2.4)

## 2019-06-21 LAB — LACTIC ACID, PLASMA
Lactic Acid, Venous: 2.7 mmol/L (ref 0.5–1.9)
Lactic Acid, Venous: 2.4 mmol/L (ref 0.5–1.9)
Lactic Acid, Venous: 2 mmol/L (ref 0.5–1.9)

## 2019-06-21 LAB — PHOSPHORUS
Phosphorus: 3.8 mg/dL (ref 2.5–4.6)
Phosphorus: 4.4 mg/dL (ref 2.5–4.6)

## 2019-06-21 MED ORDER — ALBUMIN HUMAN 5 % IV SOLN
12.5000 g | Freq: Once | INTRAVENOUS | Status: AC
  Administered 2019-06-21: 12:00:00 12.5 g via INTRAVENOUS

## 2019-06-21 MED ORDER — SODIUM CHLORIDE 0.9% IV SOLUTION
Freq: Once | INTRAVENOUS | Status: AC
  Administered 2019-06-21: 10:00:00 via INTRAVENOUS

## 2019-06-21 MED ORDER — ALBUMIN HUMAN 5 % IV SOLN
INTRAVENOUS | Status: AC
  Administered 2019-06-21: 12.5 g via INTRAVENOUS
  Filled 2019-06-21: qty 250

## 2019-06-21 MED ORDER — MAGNESIUM SULFATE 2 GM/50ML IV SOLN
2.0000 g | Freq: Once | INTRAVENOUS | Status: AC
  Administered 2019-06-21: 09:00:00 2 g via INTRAVENOUS
  Filled 2019-06-21: qty 50

## 2019-06-21 MED ORDER — ACETAMINOPHEN 160 MG/5ML PO SOLN
325.0000 mg | ORAL | Status: DC | PRN
  Administered 2019-06-21 – 2019-06-22 (×5): 650 mg
  Filled 2019-06-21 (×5): qty 20.3

## 2019-06-21 MED ORDER — LACTATED RINGERS IV BOLUS
1000.0000 mL | Freq: Once | INTRAVENOUS | Status: AC
  Administered 2019-06-21: 19:00:00 1000 mL via INTRAVENOUS

## 2019-06-21 MED ORDER — POTASSIUM CHLORIDE 10 MEQ/50ML IV SOLN: 10.0000 meq | Freq: Once | INTRAVENOUS | Status: DC

## 2019-06-21 MED ORDER — SODIUM CHLORIDE 0.9 % IV SOLN
INTRAVENOUS | Status: DC | PRN
  Administered 2019-06-21 – 2019-06-23 (×2): 10 mL/h via INTRA_ARTERIAL

## 2019-06-21 MED ORDER — POTASSIUM CHLORIDE 10 MEQ/50ML IV SOLN
10.0000 meq | INTRAVENOUS | Status: AC
  Administered 2019-06-21 (×2): 10 meq via INTRAVENOUS
  Filled 2019-06-21 (×2): qty 50

## 2019-06-21 MED ORDER — POTASSIUM CHLORIDE 10 MEQ/50ML IV SOLN
INTRAVENOUS | Status: AC
  Filled 2019-06-21: qty 50

## 2019-06-21 MED ORDER — FUROSEMIDE 10 MG/ML IJ SOLN: 40.0000 mg | Freq: Once | INTRAMUSCULAR | Status: DC

## 2019-06-21 MED ORDER — LACTATED RINGERS IV BOLUS
1000.0000 mL | Freq: Once | INTRAVENOUS | Status: AC
  Administered 2019-06-21: 20:00:00 1000 mL via INTRAVENOUS

## 2019-06-21 MED ORDER — HEPARIN SODIUM (PORCINE) 5000 UNIT/ML IJ SOLN: 5000.0000 [IU] | Freq: Three times a day (TID) | INTRAMUSCULAR | Status: DC

## 2019-06-21 MED ORDER — CALCIUM CHLORIDE 10 % IV SOLN
1.0000 g | Freq: Once | INTRAVENOUS | Status: AC
  Administered 2019-06-21: 1 g via INTRAVENOUS

## 2019-06-21 MED ORDER — FUROSEMIDE 10 MG/ML IJ SOLN
40.0000 mg | Freq: Once | INTRAMUSCULAR | Status: AC
  Administered 2019-06-21: 40 mg via INTRAVENOUS
  Filled 2019-06-21: qty 4

## 2019-06-21 NOTE — Progress Notes (Signed)
[facility] [place]  Subjective  -remains intubated.  Postop day 3 status post open abdominal aortic and right common iliac aneurysm repair with bifurcated graft.  Still on low dose levophed and vasopressin.   Objective 96/60 (!) 105 (!) 101.8 F (38.8 C) (Core) (!) 25 100%  Intake/Output Summary (Last 24 hours) at 06/21/2019 0940 Last data filed at 06/21/2019 0904 Gross per 24 hour  Intake 3266.92 ml  Output 630 ml  Net 2636.92 ml   Intubated, sedated, GCS 3 T Midline incision dressing clean and dry and intact Slightly distended Femoral pulses are palpable bilaterally Lower extremity mottling is improving He has weakly palpable dorsalis pedis pulses bilaterally Confirmed with doppler very brisk posterior tibial and dorsalis pedis signals bilaterally  Laboratory Lab Results: Recent Labs    06/20/19 0326  06/21/19 0315 06/21/19 0346  WBC 17.9*  --  9.8  --   HGB 11.0*   < > 9.6* 8.8*  HCT 33.8*   < > 28.8* 26.0*  PLT 70*  --  51*  --    < > = values in this interval not displayed.   BMET Recent Labs    06/20/19 1700 06/21/19 0315 06/21/19 0346  NA 141 142 145  K 3.9 3.7 3.6  CL 110 111  --   CO2 20* 21*  --   GLUCOSE 110* 156*  --   BUN 49* 53*  --   CREATININE 2.07* 2.15*  --   CALCIUM 6.6* 6.5*  --     COAG Lab Results  Component Value Date   INR 1.5 (H) 05/29/2019   INR 1.0 06/16/2019   No results found for: PTT  Assessment/Planning:  Postop day 3 status post open aortic aneurysm repair as well as right common iliac aneurysm repair with bifurcated dacron graft.  Remains intubated in the ICU critically ill.  Neuro: GCS 3 T this morning and intubated and sedated.  Apparently was following commands some last night for first time.  Encephalopathy. Cardiovascular: Levophed 4 mcg and vasopressin 0.03.  Hemodynamically improving.  Hgb 11 --> 9.6.  Platelets 50, will transfuse today.  High risk for bleeding given extent of open  surgery.   Pulmonary: Remains intubated.  Tolerating pressure support.  Wean to extubate per critical care. GI: Reviewed KUB, non-obstructive bowel gas pattern, has had BM post-op.  On tube feeds per critical care. Renal: UOP slowing improving.    AKi post-op, likely ATN.  Cr 2.22 --> 2.07 --> 2.15.   Liver: Shock liver, enzymes improving FEN: LR 75.  Last pH improved to 7 38.   ID: BC and tracheal aspirate pending, on vanc zosyn empirically, possible pneumonia on CXR.  Will follow cultures.  Appreciate critical care support. Continue to wean pressors.  Try to extubate.  [name], MD [facility] [place] Office: [phone] Pager: [phone]   [name] 06/21/2019 9:40 AM --

## 2019-06-21 NOTE — Progress Notes (Signed)
Due to patient's decompression of stomach being 1100 cc's Dr. Carlis Abbott stated that he would only like 1 bottle of CT contrast dye given at 1500 and other half to be administered at 1545.

## 2019-06-21 NOTE — Progress Notes (Signed)
NAME:  Lawrence Wallace, MRN:  [id number], DOB:  1955-10-11, LOS: 3 ADMISSION DATE:  06/02/2019, CONSULTATION DATE:  05/30/2019 REFERRING MD:  Carlis Abbott  CHIEF COMPLAINT:  Vent Management   Brief History   Lawrence Wallace is a 63 y.o. male who was admitted 9/30 for repair of large AAA.  He had significant intraoperative blood loss with subsequent hypotension requiring 5L IVF's, 2u PRBC, 2 FFP, 2.5L from cellsaver, and neosynephrine.  Upon leaving PACU, he was somewhat hypertensive and was started on cleviprex.  History of present illness   Pt is encephelopathic; therefore, this HPI is obtained from chart review. Lawrence Wallace is a 63 y.o. male who has a PMH including but not limited to tobacco, abuse, AAA, NSTEMI, HTN, HLD, CAD, skin CA, GERD.  He presented to [facility] 9/30 for repair of 7cm AAA and 5cm R common iliac artery aneurysm.  He had significant blood loss intraoperatively and required 5L IVF's, 2u PRBC, 2 FFP, 2.5L cell saver, and neosynephrine.  Just prior to leaving PACU, he was noted to be hypertensive and was started on cleviprex.  Neosynephrine was discontinued upon arrival to ICU and cleviprex is being titrated down.  Past Medical History  tobacco, abuse, AAA, NSTEMI, HTN, HLD, CAD, skin CA, GERD.  Significant Hospital Events   9/30 > admitted for OR repair of 7cm AAA and 5cm R common iliac artery aneurysm. 10/1-10/2 > Difficult to wean off sedation or vent due to agitation. Started on seroquel 10/3 > Tolerating PS   Consults:  PCCM.  Procedures:  ETT 9/30 >  L rad art line 9/30 >  R swan 9/30 >   Significant Diagnostic Tests:  TTE 06/19/19 - EF 60-65% No WMA or valvular abnormalities  Micro Data:  None.  Antimicrobials:  None.   Interim history/subjective:  Febrile yesterday and today. Cultured and started on broad spectrum antibiotics. Remains on low vasopressor support. Tolerating PS this morning.  Objective:  Blood pressure (!) 106/43, pulse (!) 103, temperature (!) 101.7 F  (38.7 C), resp. rate (!) 23, height 6\' 2"  (1.88 m), weight 105.4 kg, SpO2 100 %. PAP: (20-35)/(7-20) 21/14 CVP:  [4 mmHg-13 mmHg] 4 mmHg  Vent Mode: CPAP;PSV FiO2 (%):  [40 %-50 %] 40 % Set Rate:  [20 bmp] 20 bmp Vt Set:  [650 mL] 650 mL PEEP:  [5 cmH20-8 cmH20] 8 cmH20 Pressure Support:  [12 cmH20] 12 cmH20 Plateau Pressure:  [16 cmH20-24 cmH20] 20 cmH20   Intake/Output Summary (Last 24 hours) at 06/21/2019 0740 Last data filed at 06/21/2019 0700 Gross per 24 hour  Intake 3169.84 ml  Output 530 ml  Net 2639.84 ml   Filed Weights   05/25/2019 0828 06/20/19 0445 06/21/19 0100  Weight: 92.9 kg 104.3 kg 105.4 kg   Physical Exam: General: Chronically ill-appearing, sedated HENT: [place], AT, ETT in place Eyes: EOMI, no scleral icterus Respiratory: Diminished breath sounds bilaterally.  No crackles, wheezing or rales Cardiovascular: RRR, -M/R/G, no JVD GI: BS+, soft, nontender Extremities:-Edema,-tenderness, cool lower extremities, distal pulses on doppler Neuro: Sedated Skin: Pale mottled skin in lower extremities, improved GU: Foley in place  Assessment & Plan:  63 year old male with AAA and bilateral common iliac aneurysms s/p elective open AAA repair with intra-op EBL 6600 ml. He was resuscitated with 5.2L LR, 2.5L cellsaver, 2U PRBC and 2U FFP.   Hemorrhagic Shock - resolved Septic shock -Wean levophed and vasopressin for goal MAP >65 -Trend CBC. Transfuse for Hg goal >7 -Continue broad  spectrum antibiotics. De-escalate pending culture data  Agitation requiring sedation -RASS goal 0 and -1 -Wean Fentanyl and Precedex -Continue seroquel BID  Acute respiratory failure -Extubation precluded by mental status and hemodynamic instability -PS as tolerated -VAP  AKI, oliguric. Likely ATN in setting of shock -Trend BMP/UOP -Lasix x 1 for UOP  Incidental AAA s/p open repair  -7.4cm infrarenal aneurysm, EBL 6 units with 2.5 units returned via cell-saver  -Was hypotensive  peri-operatively and started on Neo, but then hypertensive post-op and initiated on Cleviprex -post op plan per vascular -KUB per vascular  Transaminitis -Likely ischemic hepatitis -Trend CMP  Acute blood loss anemia Post-op thrombocytopenia -S/p 1U Plt for goal > 50K -Trend CBC -Transfuse 1U Plt today  Hx HTN, CAD, NSTEMI -hold home lopressor, plavix, Asa and statin  Hypomag -Replete  Best Practice:  Diet: TF Pain/Anxiety/Delirium protocol (if indicated): Precedex gtt / fentanyl PRN.  RASS goal -1.  VAP protocol (if indicated): In place. DVT prophylaxis: SCD's. GI prophylaxis: PPI. Glucose control: SSI if glucose consistently > 180. Mobility: Bedrest. Code Status: Full. Family Communication: Per primary team. CCM updated wife at bedside on 10/2 Disposition: ICU  Labs   CBC: Recent Labs  Lab 06/08/2019 1651  06/19/19 0000  06/19/19 0806 06/20/19 0326 06/20/19 0938 06/21/19 0315 06/21/19 0346  WBC 12.3*  --  24.1*  --  14.9* 17.9*  --  9.8  --   NEUTROABS  --   --   --   --  11.6*  --   --   --   --   HGB 14.3   < > 15.5   < > 11.9* 11.0* 9.9* 9.6* 8.8*  HCT 44.3   < > 48.7   < > 36.1* 33.8* 29.0* 28.8* 26.0*  MCV 96.1  --  96.8  --  93.0 94.4  --  93.5  --   PLT PLATELET CLUMPS NOTED ON SMEAR, UNABLE TO ESTIMATE  --  106*  --  55* 70*  --  51*  --    < > = values in this interval not displayed.   Basic Metabolic Panel: Recent Labs  Lab 06/17/2019 1651  06/19/19 0000  06/19/19 0806 06/20/19 0326 06/20/19 0912 06/20/19 0938 06/20/19 1700 06/21/19 0315 06/21/19 0346  NA 139   < > 136   < > 141 142  --  142 141 142 145  K 5.2*   < > 6.6*   < > 5.1 4.3  --  4.2 3.9 3.7 3.6  CL 105   < > 108  --  106 110  --   --  110 111  --   CO2 23   < > 16*  --  24 21*  --   --  20* 21*  --   GLUCOSE 253*   < > 173*  --  146* 119*  --   --  110* 156*  --   BUN 13   < > 18  --  26* 41*  --   --  49* 53*  --   CREATININE 1.30*   < > 2.05*  --  2.16* 2.22*  --   --  2.07*  2.15*  --   CALCIUM 8.2*   < > 8.0*  --  7.8* 6.9*  --   --  6.6* 6.5*  --   MG 1.4*  --  1.7  --  1.5*  --  1.3*  --  1.4* 1.5*  --  PHOS 6.6*  --   --   --  5.5*  --  4.7*  --  4.0 3.8  --    < > = values in this interval not displayed.   GFR: Estimated Creatinine Clearance: 44.3 mL/min (A) (by C-G formula based on SCr of 2.15 mg/dL (H)). Recent Labs  Lab 06/19/19 0000 06/19/19 0806 06/19/19 0807 06/20/19 0326 06/21/19 0315  WBC 24.1* 14.9*  --  17.9* 9.8  LATICACIDVEN  --   --  3.2*  --   --    Liver Function Tests: Recent Labs  Lab 06/15/2019 1651 06/19/19 0000 06/19/19 0806 06/20/19 0326 06/21/19 0315  AST 134* 364* 1,432* 1,150* 412*  ALT 95* 198* 511* 446* 268*  ALKPHOS 37* 51 35* 37* 43  BILITOT 1.5* 1.7* 2.4* 3.0* 2.8*  PROT 4.1* 4.7* 4.4* 4.4* 4.4*  ALBUMIN 2.5* 2.6* 3.1* 2.5* 1.8*   Recent Labs  Lab 06/19/19 0000  AMYLASE 485*   No results for input(s): AMMONIA in the last 168 hours. ABG    Component Value Date/Time   PHART 7.389 06/21/2019 0346   PCO2ART 32.8 06/21/2019 0346   PO2ART 86.0 06/21/2019 0346   HCO3 19.5 (L) 06/21/2019 0346   TCO2 20 (L) 06/21/2019 0346   ACIDBASEDEF 4.0 (H) 06/21/2019 0346   O2SAT 96.0 06/21/2019 0346    Coagulation Profile: Recent Labs  Lab 06/16/19 0858 06/16/2019 1651  INR 1.0 1.5*   Cardiac Enzymes: No results for input(s): CKTOTAL, CKMB, CKMBINDEX, TROPONINI in the last 168 hours. HbA1C: Hgb A1c MFr Bld  Date/Time Value Ref Range Status  12/10/2017 09:20 AM 5.9 4.6 - 6.5 % Final    Comment:    Glycemic Control Guidelines for People with Diabetes:Non Diabetic:  <6%Goal of Therapy: <7%Additional Action Suggested:  >8%   06/29/2016 08:11 AM 5.7 4.6 - 6.5 % Final    Comment:    Glycemic Control Guidelines for People with Diabetes:Non Diabetic:  <6%Goal of Therapy: <7%Additional Action Suggested:  >8%    CBG: Recent Labs  Lab 06/20/19 1200 06/20/19 1532 06/20/19 1945 06/20/19 2348 06/21/19 0339  GLUCAP  112* 111* 112* 95 137*    The patient is critically ill with multiple organ systems failure and requires high complexity decision making for assessment and support, frequent evaluation and titration of therapies, application of advanced monitoring technologies and extensive interpretation of multiple databases.   Critical Care Time devoted to patient care services described in this note is 39 Minutes. This time reflects time of care of this signee Dr. Rodman Pickle. Discussed plan with primary team and bedside RN.  Rodman Pickle, M.D. [facility] Pulmonary/Critical Care Medicine 06/21/2019 7:58 AM  Pager: [phone] After hours pager: [phone]

## 2019-06-21 NOTE — Progress Notes (Signed)
Transported pt to CT scan and back to 2H06 without incident.

## 2019-06-21 NOTE — Procedures (Signed)
Arterial Catheter Insertion Procedure Note Lawrence Wallace [id number] 09/10/1956  Procedure: Insertion of Arterial Catheter  Indications: Blood pressure monitoring and Frequent blood sampling  Procedure Details Consent: Risks of procedure as well as the alternatives and risks of each were explained to the (patient/caregiver).  Consent for procedure obtained. Time Out: Verified patient identification, verified procedure, site/side was marked, verified correct patient position, special equipment/implants available, medications/allergies/relevent history reviewed, required imaging and test results available.  Performed  Maximum sterile technique was used including antiseptics, cap, gloves, gown, hand hygiene, mask and sheet. Skin prep: Chlorhexidine; local anesthetic administered 20 gauge catheter was inserted into right radial artery using the Seldinger technique. ULTRASOUND GUIDANCE USED: YES Evaluation Blood flow good; BP tracing good. Complications: No apparent complications.   Earney Navy 06/21/2019

## 2019-06-21 NOTE — Progress Notes (Signed)
Spoke with Margreta Journey RN re PICC order.  Blood cultures pending and febrile all shift TMax 102. Pt has RIJ line in place.  Will attempt PICC placement once BC negative x 48 hours.

## 2019-06-21 NOTE — Progress Notes (Signed)
No suitable veins for PIV ,assessed with ultrasound.Primary RN aware and will notify MD.

## 2019-06-21 NOTE — Progress Notes (Signed)
VASCULAR LAB PRELIMINARY  PRELIMINARY  PRELIMINARY  PRELIMINARY  Left upper extremity arterial duplex completed.    Preliminary report:  See CV proc for preliminary results.   Mckinley Olheiser, RVT 06/21/2019, 7:39 PM

## 2019-06-21 NOTE — Progress Notes (Addendum)
63 year old male now postop day 3 status post open abdominal aortic and right common iliac aneurysm repair with bifurcated graft.  Remains critically ill in the ICU on Levophed and vasopressin.  He had been started empirically on Vanc and Zosyn yesterday with concern for possible underlying pneumonia on chest x-ray while febrile.  He does have gram negative rods growing out of his tracheal aspirate.  Today he was a bit more distended this morning and then had a large emesis during transition to pressure support to try and extubate.  At that time he became profoundly hypotensive and had to go up on his pressors.  In an effort to ensure there are no other underlying etiologies, I did get a CT abdomen pelvis without contrast and I do not see any evidence of pneumatosis, perforation, or free air that would warrant abdominal exploration.  Expected post-op findings on CT with stranding in RP and some fluid - no thickened bowel etc.  I have placed his NG back to suction and stopped his tube feeds given distension this am.  His CVP is now 4 and I think he still dry.  We will attempt to give him 2 L of LR tonight for further resuscitation and wean pressors.  He will continue his antibiotics and will follow his blood cultures as well as his respiratory cultures.  WBC trending down.  Lactic acid 2.7 from 3.2.  Renal function and liver function slowly recovering.  [name], MD [facility] [place] Office: [phone] Pager: [place]

## 2019-06-21 NOTE — Progress Notes (Addendum)
At patient bedside. Patient sat up abruptly and attempted to pull at ETT. Patient started gagging and vomited a small amount of what appeared to be tube feed approximately 75 mL. I immediately stopped the tube feed and used oral suction to clear the mouth. RT at bedside restored vent to full support. SVT up to 180's noted. Labored breathing noted. Dr Nelda Marseille notified and Dr. Carlis Abbott notified.   1110 Dr. Carlis Abbott at bedside. Updated on events and notified that left hand was cool and cyanotic.   1300 Dr Carlis Abbott at bedside. D/C'ed left radial art line and left peripheral IV of hand.

## 2019-06-22 LAB — PREPARE PLATELET PHERESIS
Unit division: 0
Unit division: 0

## 2019-06-22 LAB — COMPREHENSIVE METABOLIC PANEL
ALT: 154 U/L — ABNORMAL HIGH (ref 0–44)
AST: 160 U/L — ABNORMAL HIGH (ref 15–41)
Albumin: 1.8 g/dL — ABNORMAL LOW (ref 3.5–5.0)
Alkaline Phosphatase: 53 U/L (ref 38–126)
Anion gap: 10 (ref 5–15)
BUN: 52 mg/dL — ABNORMAL HIGH (ref 8–23)
CO2: 24 mmol/L (ref 22–32)
Calcium: 6.8 mg/dL — ABNORMAL LOW (ref 8.9–10.3)
Chloride: 110 mmol/L (ref 98–111)
Creatinine, Ser: 1.92 mg/dL — ABNORMAL HIGH (ref 0.61–1.24)
GFR calc Af Amer: 41 mL/min — ABNORMAL LOW (ref >60)
GFR calc non Af Amer: 36 mL/min — ABNORMAL LOW (ref >60)
Glucose, Bld: 109 mg/dL — ABNORMAL HIGH (ref 70–99)
Potassium: 3.6 mmol/L (ref 3.5–5.1)
Sodium: 144 mmol/L (ref 135–145)
Total Bilirubin: 4 mg/dL — ABNORMAL HIGH (ref 0.3–1.2)
Total Protein: 4.4 g/dL — ABNORMAL LOW (ref 6.5–8.1)

## 2019-06-22 LAB — CBC WITH DIFFERENTIAL/PLATELET
Abs Immature Granulocytes: 0.07 10*3/uL (ref 0.00–0.07)
Basophils Absolute: 0 10*3/uL (ref 0.0–0.1)
Basophils Relative: 0 %
Eosinophils Absolute: 0 10*3/uL (ref 0.0–0.5)
Eosinophils Relative: 0 %
HCT: 27.5 % — ABNORMAL LOW (ref 39.0–52.0)
Hemoglobin: 8.7 g/dL — ABNORMAL LOW (ref 13.0–17.0)
Immature Granulocytes: 1 %
Lymphocytes Relative: 10 %
Lymphs Abs: 1.2 10*3/uL (ref 0.7–4.0)
MCH: 30.5 pg (ref 26.0–34.0)
MCHC: 31.6 g/dL (ref 30.0–36.0)
MCV: 96.5 fL (ref 80.0–100.0)
Monocytes Absolute: 0.9 10*3/uL (ref 0.1–1.0)
Monocytes Relative: 8 %
Neutro Abs: 9 10*3/uL — ABNORMAL HIGH (ref 1.7–7.7)
Neutrophils Relative %: 81 %
Platelets: 68 10*3/uL — ABNORMAL LOW (ref 150–400)
RBC: 2.85 MIL/uL — ABNORMAL LOW (ref 4.22–5.81)
RDW: 15.2 % (ref 11.5–15.5)
WBC: 11.1 10*3/uL — ABNORMAL HIGH (ref 4.0–10.5)
nRBC: 0.4 % — ABNORMAL HIGH (ref 0.0–0.2)

## 2019-06-22 LAB — BPAM PLATELET PHERESIS
Blood Product Expiration Date: 202010042359
Blood Product Expiration Date: 202010052359
ISSUE DATE / TIME: 202010030818
ISSUE DATE / TIME: 202010030934
Unit Type and Rh: 6200
Unit Type and Rh: 6200

## 2019-06-22 LAB — GLUCOSE, CAPILLARY
Glucose-Capillary: 87 mg/dL (ref 70–99)
Glucose-Capillary: 90 mg/dL (ref 70–99)
Glucose-Capillary: 90 mg/dL (ref 70–99)
Glucose-Capillary: 92 mg/dL (ref 70–99)
Glucose-Capillary: 95 mg/dL (ref 70–99)
Glucose-Capillary: 97 mg/dL (ref 70–99)

## 2019-06-22 LAB — CULTURE, RESPIRATORY W GRAM STAIN

## 2019-06-22 LAB — MAGNESIUM: Magnesium: 2 mg/dL (ref 1.7–2.4)

## 2019-06-22 LAB — CORTISOL: Cortisol, Plasma: 25.4 ug/dL

## 2019-06-22 LAB — PROCALCITONIN: Procalcitonin: 7.57 ng/mL

## 2019-06-22 LAB — LIPASE, BLOOD: Lipase: 57 U/L — ABNORMAL HIGH (ref 11–51)

## 2019-06-22 MED ORDER — HYDROCORTISONE NA SUCCINATE PF 100 MG IJ SOLR
50.0000 mg | Freq: Four times a day (QID) | INTRAMUSCULAR | Status: DC
  Administered 2019-06-22 – 2019-06-27 (×22): 50 mg via INTRAVENOUS
  Filled 2019-06-22 (×22): qty 2

## 2019-06-22 MED ORDER — POTASSIUM CHLORIDE 20 MEQ/15ML (10%) PO SOLN
40.0000 meq | Freq: Once | ORAL | Status: AC
  Administered 2019-06-22: 40 meq
  Filled 2019-06-22: qty 30

## 2019-06-22 MED ORDER — ENOXAPARIN SODIUM 30 MG/0.3ML ~~LOC~~ SOLN
30.0000 mg | SUBCUTANEOUS | Status: DC
  Administered 2019-06-22 – 2019-06-27 (×6): 30 mg via SUBCUTANEOUS
  Filled 2019-06-22 (×6): qty 0.3

## 2019-06-22 NOTE — Progress Notes (Signed)
NAME:  Lawrence Wallace, MRN:  [id number], DOB:  04-16-1956, LOS: 4 ADMISSION DATE:  05/26/2019, CONSULTATION DATE:  06/13/2019 REFERRING MD:  Carlis Abbott  CHIEF COMPLAINT:  Vent Management   Brief History   Lawrence Wallace is a 63 y.o. male who was admitted 9/30 for repair of large AAA.  He had significant intraoperative blood loss with subsequent hypotension requiring 5L IVF's, 2u PRBC, 2 FFP, 2.5L from cellsaver, and neosynephrine.  Upon leaving PACU, he was somewhat hypertensive and was started on cleviprex.  History of present illness   Pt is encephelopathic; therefore, this HPI is obtained from chart review. Lawrence Wallace is a 63 y.o. male who has a PMH including but not limited to tobacco, abuse, AAA, NSTEMI, HTN, HLD, CAD, skin CA, GERD.  He presented to [facility] 9/30 for repair of 7cm AAA and 5cm R common iliac artery aneurysm.  He had significant blood loss intraoperatively and required 5L IVF's, 2u PRBC, 2 FFP, 2.5L cell saver, and neosynephrine.  Just prior to leaving PACU, he was noted to be hypertensive and was started on cleviprex.  Neosynephrine was discontinued upon arrival to ICU and cleviprex is being titrated down.  Past Medical History  tobacco, abuse, AAA, NSTEMI, HTN, HLD, CAD, skin CA, GERD.  Significant Hospital Events   9/30 > admitted for OR repair of 7cm AAA and 5cm R common iliac artery aneurysm. 10/1-10/2 > Difficult to wean off sedation or vent due to agitation. Started on seroquel 10/3 > Tolerating PS   Consults:  PCCM.  Procedures:  ETT 9/30 >  L rad art line 9/30 >  R swan 9/30 >   Significant Diagnostic Tests:  TTE 06/19/19 - EF 60-65% No WMA or valvular abnormalities  Micro Data:  Blood 10/3>>> Urine 10/3>>> Sputum 10/3>>>  Antimicrobials:  Vanc 10/3>>> Zosyn 10/3>>>  Interim history/subjective:  Remains febrile with improving WBC Abx started on 10/3 Vomit overnight and TF on hold CT of the abdomen done but per VVS no concern for ischemic bowel Lactic acid  and levophed demand decreasing  Objective:  Blood pressure (!) 144/51, pulse 90, temperature (!) 101.5 F (38.6 C), resp. rate (!) 26, height 6\' 2"  (1.88 m), weight 107.2 kg, SpO2 97 %. PAP: (24-35)/(12-24) 27/16 CVP:  [4 mmHg-15 mmHg] 13 mmHg  Vent Mode: PSV;CPAP FiO2 (%):  [40 %-60 %] 40 % Set Rate:  [20 bmp] 20 bmp Vt Set:  [650 mL] 650 mL PEEP:  [5 cmH20-8 cmH20] 5 cmH20 Pressure Support:  [10 cmH20] 10 cmH20 Plateau Pressure:  [20 cmH20-23 cmH20] 23 cmH20   Intake/Output Summary (Last 24 hours) at 06/22/2019 [id number] Last data filed at 06/22/2019 0700 Gross per 24 hour  Intake 5302.59 ml  Output 3535 ml  Net 1767.59 ml   Filed Weights   06/21/19 0100 06/22/19 0000 06/22/19 0409  Weight: 105.4 kg 107.2 kg 107.2 kg   Physical Exam: General: Acute on chronically ill appearing male, NAD HENT: [place]/AT, PERRL, EOM-I and MMM, ETT in place Respiratory: Mild rales but otherwise clear Cardiovascular: RRR, Nl S1/S2 and -M/R/G GI: Soft, distended, NT and hypoactive BS Extremities: -Edema,-tenderness, cool lower extremities, distal pulses on doppler Neuro: Sedate, not following commands Skin: Pale mottled skin in lower extremities, improved per staff GU: Foley in place  I reviewed CXR myself, ETT is in a good position   Assessment & Plan:  63 year old male with AAA and bilateral common iliac aneurysms s/p elective open AAA repair with intra-op EBL 6600  ml. He was resuscitated with 5.2L LR, 2.5L cellsaver, 2U PRBC and 2U FFP.   Hemorrhagic Shock - resolved Septic shock - Wean levophed for SBP of 100 mmHg - Trend CBC.  Transfuse for Hg goal >7 - Continue vanc/zosyn - F/U on culture - Check PCT today - Check cortisol level - Start stress dose steroids  Agitation requiring sedation - RASS goal 0 and -1 - Wean Fentanyl and Precedex - Continue seroquel BID  Acute respiratory failure - Hold weaning today while addressing hemodynamics - Titrate O2 for sat of 88-92% - VAP  prevention protocol  AKI, oliguric. Likely ATN in setting of shock - Trend BMP/UOP - Hold further lasix for today - Monitor CVP  Incidental AAA s/p open repair  - 7.4cm infrarenal aneurysm, EBL 6 units with 2.5 units returned via cell-saver  - Was hypotensive peri-operatively and started on Neo, but then hypertensive post-op and initiated on Cleviprex - Post op plan per vascular - KUB per vascular  Transaminitis - Likely ischemic hepatitis - Trend CMP - NPO today  Acute blood loss anemia Post-op thrombocytopenia - S/p 1U Plt for goal > 50K - Trend CBC - Transfuse 1U Plt today   Hx HTN, CAD, NSTEMI - Hold home lopressor, plavix, Asa and statin  Hypomag - Replete  Discussed with VVS-MD  Labs   CBC: Recent Labs  Lab 06/19/19 0806 06/20/19 0326  06/21/19 0315 06/21/19 0346 06/21/19 1136 06/21/19 1153 06/22/19 0311  WBC 14.9* 17.9*  --  9.8  --   --  12.5* 11.1*  NEUTROABS 11.6*  --   --   --   --   --   --  9.0*  HGB 11.9* 11.0*   < > 9.6* 8.8* 9.9* 10.0* 8.7*  HCT 36.1* 33.8*   < > 28.8* 26.0* 29.0* 29.5* 27.5*  MCV 93.0 94.4  --  93.5  --   --  93.4 96.5  PLT 55* 70*  --  51*  --   --  96* 68*   < > = values in this interval not displayed.   Basic Metabolic Panel: Recent Labs  Lab 06/19/19 0806 06/20/19 0326 06/20/19 0912  06/20/19 1700 06/21/19 0315 06/21/19 0346 06/21/19 1136 06/21/19 1153 06/21/19 1645 06/22/19 0311  NA 141 142  --    < > 141 142 145 148* 149*  --  144  K 5.1 4.3  --    < > 3.9 3.7 3.6 3.2* 3.4*  --  3.6  CL 106 110  --   --  110 111  --   --  111  --  110  CO2 24 21*  --   --  20* 21*  --   --  24  --  24  GLUCOSE 146* 119*  --   --  110* 156*  --   --  126*  --  109*  BUN 26* 41*  --   --  49* 53*  --   --  53*  --  52*  CREATININE 2.16* 2.22*  --   --  2.07* 2.15*  --   --  2.10*  --  1.92*  CALCIUM 7.8* 6.9*  --   --  6.6* 6.5*  --   --  6.5*  --  6.8*  MG 1.5*  --  1.3*  --  1.4* 1.5*  --   --  1.5* 1.9 2.0  PHOS 5.5*   --  4.7*  --  4.0 3.8  --   --   --  4.4  --    < > = values in this interval not displayed.   GFR: Estimated Creatinine Clearance: 50 mL/min (A) (by C-G formula based on SCr of 1.92 mg/dL (H)). Recent Labs  Lab 06/19/19 0807 06/20/19 0326 06/21/19 0315 06/21/19 1153 06/21/19 1423 06/21/19 1908 06/21/19 2227 06/22/19 0311  WBC  --  17.9* 9.8 12.5*  --   --   --  11.1*  LATICACIDVEN 3.2*  --   --   --  2.7* 2.4* 2.0*  --    Liver Function Tests: Recent Labs  Lab 06/19/19 0000 06/19/19 0806 06/20/19 0326 06/21/19 0315 06/22/19 0311  AST 364* 1,432* 1,150* 412* 160*  ALT 198* 511* 446* 268* 154*  ALKPHOS 51 35* 37* 43 53  BILITOT 1.7* 2.4* 3.0* 2.8* 4.0*  PROT 4.7* 4.4* 4.4* 4.4* 4.4*  ALBUMIN 2.6* 3.1* 2.5* 1.8* 1.8*   Recent Labs  Lab 06/19/19 0000 06/22/19 0311  LIPASE  --  57*  AMYLASE 485*  --    No results for input(s): AMMONIA in the last 168 hours. ABG    Component Value Date/Time   PHART 7.422 06/21/2019 1136   PCO2ART 37.1 06/21/2019 1136   PO2ART 87.0 06/21/2019 1136   HCO3 24.2 06/21/2019 1136   TCO2 25 06/21/2019 1136   ACIDBASEDEF 4.0 (H) 06/21/2019 0346   O2SAT 97.0 06/21/2019 1136    Coagulation Profile: Recent Labs  Lab 06/16/19 0858 05/21/2019 1651  INR 1.0 1.5*   Cardiac Enzymes: No results for input(s): CKTOTAL, CKMB, CKMBINDEX, TROPONINI in the last 168 hours. HbA1C: Hgb A1c MFr Bld  Date/Time Value Ref Range Status  12/10/2017 09:20 AM 5.9 4.6 - 6.5 % Final    Comment:    Glycemic Control Guidelines for People with Diabetes:Non Diabetic:  <6%Goal of Therapy: <7%Additional Action Suggested:  >8%   06/29/2016 08:11 AM 5.7 4.6 - 6.5 % Final    Comment:    Glycemic Control Guidelines for People with Diabetes:Non Diabetic:  <6%Goal of Therapy: <7%Additional Action Suggested:  >8%    CBG: Recent Labs  Lab 06/21/19 0750 06/21/19 1955 06/21/19 2322 06/22/19 0318 06/22/19 0812  GLUCAP 126* 115* 109* 97 90   The patient is  critically ill with multiple organ systems failure and requires high complexity decision making for assessment and support, frequent evaluation and titration of therapies, application of advanced monitoring technologies and extensive interpretation of multiple databases.   Critical Care Time devoted to patient care services described in this note is  34  Minutes. This time reflects time of care of this signee Dr [name]. This critical care time does not reflect procedure time, or teaching time or supervisory time of PA/NP/Med student/Med Resident etc but could involve care discussion time.  Rush Farmer, M.D. [facility] Pulmonary/Critical Care Medicine. Pager: [phone]. After hours pager: [phone].

## 2019-06-22 NOTE — Progress Notes (Signed)
[facility] [place]  Subjective  -remains intubated.  Postop day 4 status post open abdominal aortic and right common iliac aneurysm repair with bifurcated graft.  Still on low dose levophed and vasopressin.   Objective 115/63 88 (!) 100.9 F (38.3 C) (!) 25 94%  Intake/Output Summary (Last 24 hours) at 06/22/2019 1059 Last data filed at 06/22/2019 1000 Gross per 24 hour  Intake 4958.49 ml  Output 3720 ml  Net 1238.49 ml   Intubated, sedated, GCS 3 T, reportedly opening his eyes some Midline incision dressing clean and dry and intact Distension improved Femoral pulses are palpable bilaterally Lower extremity mottling is improving He has weakly palpable dorsalis pedis pulses bilaterally Confirmed with doppler very brisk posterior tibial and dorsalis pedis signals bilaterally Left radial occluded distally, left ulnar artery triphasic, duskiness of left middle finger  Laboratory Lab Results: Recent Labs    06/21/19 1153 06/22/19 0311  WBC 12.5* 11.1*  HGB 10.0* 8.7*  HCT 29.5* 27.5*  PLT 96* 68*   BMET Recent Labs    06/21/19 1153 06/22/19 0311  NA 149* 144  K 3.4* 3.6  CL 111 110  CO2 24 24  GLUCOSE 126* 109*  BUN 53* 52*  CREATININE 2.10* 1.92*  CALCIUM 6.5* 6.8*    COAG Lab Results  Component Value Date   INR 1.5 (H) 06/08/2019   INR 1.0 06/16/2019   No results found for: PTT  Assessment/Planning:  Postop day 4 status post open aortic aneurysm repair as well as right common iliac aneurysm repair with bifurcated dacron graft.  Remains intubated in the ICU critically ill.  Neuro: GCS 3 T this morning and intubated and sedated.  Encephalopathy. Trying to wean sedation.  On precedex and low dose fentanyl. Cardiovascular: Levophed 10 mcg and vasopressin 0.04.  Hgb 9.6 --> 9.9 --> 10 --> 8.7.  Platelets transfused yesterday.  Will check HIT panel.  Platelets 68 today. Pulmonary: Remains intubated.  Tolerating pressure support.  Wean  to extubate per critical care. GI: CT abd/pelvis yesterday after emesis, no signs of thickened bowel, pnematosis, or free air.  No plans for abdominal exploration.  Had normal BM yesterday.  LA normalizing.  WBC normalizing.  NPO, NG to suction. Renal: UOP slowing improving.    AKi post-op, likely ATN.  Cr 2.22 --> 2.07 --> 2.15 --> 1.92. Liver: Shock liver, enzymes improving FEN: LR 75.  Last pH improved to 7 38.   ID: BC no growth to date, tracheal aspirate e coli.  on vanc zosyn, suspect pneumonia on CXR.  Will follow cultures. Check HIT panel.  Start lovenox daily for DVT prophylaxis.    Appreciate critical care support. Continue to wean pressors.  Try to extubate.  [name], MD [facility] [place] Office: [phone] Pager: [phone]   [name] 06/22/2019 10:59 AM --

## 2019-06-22 NOTE — Progress Notes (Addendum)
At bedside to place PICC line.  Pt remains febrile.  Dr Carlis Abbott at bedside states to hold off until 06/23/19 to place PICC in hopes that fever will cease.  April and Jessica RN aware of delay and reasoning. Consent obtained from wife.Left arm to be avoided due to poor perfusion.

## 2019-06-23 ENCOUNTER — Other Ambulatory Visit: Payer: Self-pay

## 2019-06-23 ENCOUNTER — Inpatient Hospital Stay (HOSPITAL_COMMUNITY): Payer: Medicare Other

## 2019-06-23 DIAGNOSIS — J810 Acute pulmonary edema: Secondary | ICD-10-CM

## 2019-06-23 DIAGNOSIS — A419 Sepsis, unspecified organism: Secondary | ICD-10-CM

## 2019-06-23 DIAGNOSIS — R6521 Severe sepsis with septic shock: Secondary | ICD-10-CM

## 2019-06-23 LAB — POCT I-STAT 7, (LYTES, BLD GAS, ICA,H+H)
Acid-Base Excess: 3 mmol/L — ABNORMAL HIGH (ref 0.0–2.0)
Bicarbonate: 26.3 mmol/L (ref 20.0–28.0)
Calcium, Ion: 1.05 mmol/L — ABNORMAL LOW (ref 1.15–1.40)
HCT: 29 % — ABNORMAL LOW (ref 39.0–52.0)
Hemoglobin: 9.9 g/dL — ABNORMAL LOW (ref 13.0–17.0)
O2 Saturation: 97 %
Patient temperature: 98.1
Potassium: 3.3 mmol/L — ABNORMAL LOW (ref 3.5–5.1)
Sodium: 149 mmol/L — ABNORMAL HIGH (ref 135–145)
TCO2: 27 mmol/L (ref 22–32)
pCO2 arterial: 35.7 mmHg (ref 32.0–48.0)
pH, Arterial: 7.473 — ABNORMAL HIGH (ref 7.350–7.450)
pO2, Arterial: 82 mmHg — ABNORMAL LOW (ref 83.0–108.0)

## 2019-06-23 LAB — BASIC METABOLIC PANEL
Anion gap: 10 (ref 5–15)
BUN: 50 mg/dL — ABNORMAL HIGH (ref 8–23)
CO2: 25 mmol/L (ref 22–32)
Calcium: 7 mg/dL — ABNORMAL LOW (ref 8.9–10.3)
Chloride: 114 mmol/L — ABNORMAL HIGH (ref 98–111)
Creatinine, Ser: 1.83 mg/dL — ABNORMAL HIGH (ref 0.61–1.24)
GFR calc Af Amer: 44 mL/min — ABNORMAL LOW (ref >60)
GFR calc non Af Amer: 38 mL/min — ABNORMAL LOW (ref >60)
Glucose, Bld: 105 mg/dL — ABNORMAL HIGH (ref 70–99)
Potassium: 3.3 mmol/L — ABNORMAL LOW (ref 3.5–5.1)
Sodium: 149 mmol/L — ABNORMAL HIGH (ref 135–145)

## 2019-06-23 LAB — GLUCOSE, CAPILLARY
Glucose-Capillary: 98 mg/dL (ref 70–99)
Glucose-Capillary: 92 mg/dL (ref 70–99)
Glucose-Capillary: 86 mg/dL (ref 70–99)
Glucose-Capillary: 74 mg/dL (ref 70–99)
Glucose-Capillary: 82 mg/dL (ref 70–99)

## 2019-06-23 LAB — CBC
HCT: 25.8 % — ABNORMAL LOW (ref 39.0–52.0)
Hemoglobin: 8.8 g/dL — ABNORMAL LOW (ref 13.0–17.0)
MCH: 31.8 pg (ref 26.0–34.0)
MCHC: 34.1 g/dL (ref 30.0–36.0)
MCV: 93.1 fL (ref 80.0–100.0)
Platelets: 75 10*3/uL — ABNORMAL LOW (ref 150–400)
RBC: 2.77 MIL/uL — ABNORMAL LOW (ref 4.22–5.81)
RDW: 14.6 % (ref 11.5–15.5)
WBC: 10 10*3/uL (ref 4.0–10.5)
nRBC: 0.4 % — ABNORMAL HIGH (ref 0.0–0.2)

## 2019-06-23 LAB — PHOSPHORUS: Phosphorus: 3.8 mg/dL (ref 2.5–4.6)

## 2019-06-23 LAB — HEPARIN INDUCED PLATELET AB (HIT ANTIBODY): Heparin Induced Plt Ab: 0.051 OD (ref 0.000–0.400)

## 2019-06-23 LAB — MAGNESIUM: Magnesium: 2.2 mg/dL (ref 1.7–2.4)

## 2019-06-23 LAB — PROCALCITONIN: Procalcitonin: 7.14 ng/mL

## 2019-06-23 MED ORDER — SODIUM CHLORIDE 0.9% FLUSH
10.0000 mL | Freq: Two times a day (BID) | INTRAVENOUS | Status: DC
  Administered 2019-06-23 – 2019-06-27 (×8): 10 mL

## 2019-06-23 MED ORDER — POTASSIUM CHLORIDE 20 MEQ/15ML (10%) PO SOLN: 40.0000 meq | Freq: Once | ORAL | Status: DC

## 2019-06-23 MED ORDER — FUROSEMIDE 10 MG/ML IJ SOLN
40.0000 mg | Freq: Once | INTRAMUSCULAR | Status: AC
  Administered 2019-06-23: 40 mg via INTRAVENOUS
  Filled 2019-06-23: qty 4

## 2019-06-23 MED ORDER — POTASSIUM CHLORIDE 10 MEQ/100ML IV SOLN
10.0000 meq | INTRAVENOUS | Status: AC
  Administered 2019-06-23 (×2): 10 meq via INTRAVENOUS
  Filled 2019-06-23 (×2): qty 100

## 2019-06-23 MED ORDER — FREE WATER
250.0000 mL | Status: DC
  Administered 2019-06-23 – 2019-06-26 (×18): 250 mL

## 2019-06-23 MED ORDER — SODIUM CHLORIDE 0.9% FLUSH: 10.0000 mL | INTRAVENOUS | Status: DC | PRN

## 2019-06-23 NOTE — Progress Notes (Signed)
[facility] [place]  Subjective  -remains intubated.  Postop day 5 status post open abdominal aortic and right common iliac aneurysm repair with bifurcated graft.  Weaned off vasopressin.  Levophed at 6 mcg.   Objective (!) 78/62 86 99.3 F (37.4 C) (Oral) (!) 22 92%  Intake/Output Summary (Last 24 hours) at 06/23/2019 0953 Last data filed at 06/23/2019 0600 Gross per 24 hour  Intake 1286.78 ml  Output 2234 ml  Net -947.22 ml   Intubated, sedated, GCS 3 T Midline incision dressing clean and dry and intact Distension improved Femoral pulses are palpable bilaterally Lower extremity mottling is improving Left hand looks better He has weakly palpable dorsalis pedis pulses bilaterally Confirmed with doppler very brisk posterior tibial and dorsalis pedis signals bilaterally Left radial occluded distally, left ulnar artery triphasic, duskiness of left middle finger improved  Laboratory Lab Results: Recent Labs    06/22/19 0311 06/23/19 0339 06/23/19 0351  WBC 11.1* 10.0  --   HGB 8.7* 8.8* 9.9*  HCT 27.5* 25.8* 29.0*  PLT 68* 75*  --    BMET Recent Labs    06/22/19 0311 06/23/19 0339 06/23/19 0351  NA 144 149* 149*  K 3.6 3.3* 3.3*  CL 110 114*  --   CO2 24 25  --   GLUCOSE 109* 105*  --   BUN 52* 50*  --   CREATININE 1.92* 1.83*  --   CALCIUM 6.8* 7.0*  --     COAG Lab Results  Component Value Date   INR 1.5 (H) 06/14/2019   INR 1.0 06/16/2019   No results found for: PTT  Assessment/Planning:  Postop day 5 status post open aortic aneurysm repair as well as right common iliac aneurysm repair with bifurcated dacron graft.  Remains intubated in the ICU critically ill.  Neuro: GCS 3 T this morning and intubated and sedated.  Encephalopathy. Trying to wean sedation.  On precedex and low dose fentanyl. Cardiovascular: Vasopressin weaned off, Levophed down to 6 mcg.  Hgb 9.6 --> 9.9 --> 10 --> 8.7 --> 8.8.  Platelets transfused  yesterday.  Will check HIT panel.  Platelets rising 75 today. Pulmonary: Remains intubated.  Wean to extubate per critical care. GI: CT abd/pelvis yesterday after emesis, no signs of thickened bowel, pnematosis, or free air over weekend.  No plans for abdominal exploration.  Had normal BM Saturday.  LA normalizing.  WBC normalizing.  NPO, NG to suction. Renal: UOP improving..    AKI post-op, likely ATN.  Cr 2.22 --> 2.07 --> 2.15 --> 1.92 --> 1.83. Liver: Shock liver, enzymes improving FEN: pH improved 7.4. ID: BC no growth to date, tracheal aspirate e coli.  on vanc zosyn, suspect pneumonia, e. Coli pneumonia.  Will follow cultures. Check HIT panel.  Start lovenox daily for DVT prophylaxis.    Appreciate critical care support. Continue to wean pressors.  Try to extubate.  [name], MD [facility] [place] Office: [phone] Pager: [phone]   [name] 06/23/2019 9:53 AM --

## 2019-06-23 NOTE — Progress Notes (Signed)
PT Cancellation Note  Patient Details Name: Lawrence Wallace MRN: [id number] DOB: December 24, 1955   Cancelled Treatment:    Reason Eval/Treat Not Completed: Medical issues which prohibited therapy;Other (comment).  Intubated, restrained and sedated.  Will see as able 10/6 or sign off until pt is ready.  06/23/2019  Donnella Sham, [place] [phone]  (pager) [phone]  (office)  Tessie Fass Ricardo Kayes 06/23/2019, 11:25 AM

## 2019-06-23 NOTE — TOC Initial Note (Signed)
Transition of Care [facility]) - Initial/Assessment Note    Patient Details  Name: Lawrence Wallace MRN: [id number] Date of Birth: [birth year]  [facility]) CM/SW Contact:    Bethena Roys, RN Phone Number: 06/23/2019, 3:00 PM  Clinical Narrative: Pt presented for AAA-repair. Continues on Precedex, Levophed gtt and IV Lasix. PTA from home with spouse. PT/OT to consult once stable. CM will continue to follow for transition of care needs.                   Barriers to Discharge: Continued Medical Work up   Living arrangements for the past 2 months: [place]                   Prior Living Arrangements/Services Living arrangements for the past 2 months: [place] with:: Spouse   Alcohol / Substance Use: Not Applicable Psych Involvement: No (comment)  Admission diagnosis:  AAA (abdominal aortic aneurysm) ([place]) [I71.4] Patient Active Problem List   Diagnosis Date Noted  . S/P AAA repair 06/05/2019  . AAA (abdominal aortic aneurysm) ([place]) 06/13/2019  . Acute respiratory failure with hypoxia ([place])   . Blood loss anemia   . Encephalopathy acute   . Hypotension   . AAA (abdominal aortic aneurysm) without rupture ([place]) 05/13/2019  . Hemoptysis 05/08/2019  . Hypertension 01/17/2019  . Hyperlipidemia LDL goal <70 01/17/2019  . CAD in native artery 01/17/2019  . NSTEMI (non-ST elevated myocardial infarction) ([place]) 01/15/2019  . Lower extremity pain 03/13/2017  . Chest wall pain 03/01/2016  . GERD (gastroesophageal reflux disease) 05/31/2015  . Preventative health care 05/31/2015  . Tobacco abuse 05/31/2015   PCP:  Pleas Koch, NP Pharmacy:   [facility] [id number], [place] [address] Phone: [phone] Fax: [place], [place] [facility] [address] [place] [address] [place]  [address] Phone: [phone] Fax: [phone]     Social Determinants of Health (SDOH) Interventions    Readmission Risk Interventions No flowsheet data found.

## 2019-06-23 NOTE — Progress Notes (Signed)
Peripherally Inserted Central Catheter/Midline Placement  The IV Nurse has discussed with the patient and/or persons authorized to consent for the patient, the purpose of this procedure and the potential benefits and risks involved with this procedure.  The benefits include less needle sticks, lab draws from the catheter, and the patient may be discharged home with the catheter. Risks include, but not limited to, infection, bleeding, blood clot (thrombus formation), and puncture of an artery; nerve damage and irregular heartbeat and possibility to perform a PICC exchange if needed/ordered by physician.  Alternatives to this procedure were also discussed.  Bard Power PICC patient education guide, fact sheet on infection prevention and patient information card has been provided to patient /or left at bedside.    Consent obtained with spouse  PICC/Midline Placement Documentation  PICC Double Lumen 06/23/19 PICC Right Brachial 40 cm 0 cm (Active)  Indication for Insertion or Continuance of Line Poor Vasculature-patient has had multiple peripheral attempts or PIVs lasting less than 24 hours 06/23/19 1200  Exposed Catheter (cm) 0 cm 06/23/19 1200  Site Assessment Clean;Dry;Intact 06/23/19 1200  Lumen #1 Status Flushed;Saline locked;Blood return noted 06/23/19 1200  Lumen #2 Status Flushed;Saline locked;Blood return noted 06/23/19 1200  Dressing Type Transparent;Securing device 06/23/19 1200  Dressing Status Clean;Dry;Intact;Antimicrobial disc in place 06/23/19 1200  Dressing Change Due 06/30/19 06/23/19 1200       Holley Bouche Renee 06/23/2019, 12:33 PM

## 2019-06-23 NOTE — Progress Notes (Signed)
NAME:  ABHIRAJ DONIS, MRN:  [id number], DOB:  [birth year], LOS: 5 ADMISSION DATE:  05/22/2019, CONSULTATION DATE:  06/13/2019 REFERRING MD:  Carlis Abbott  CHIEF COMPLAINT:  Vent Management   Brief History   DELMO RADAKOVICH is a 63 y.o. male who was admitted 9/30 for repair of large AAA.  He had significant intraoperative blood loss with subsequent hypotension requiring 5L IVF's, 2u PRBC, 2 FFP, 2.5L from cellsaver, and neosynephrine.  Upon leaving PACU, he was somewhat hypertensive and was started on cleviprex.  History of present illness   Pt is encephelopathic; therefore, this HPI is obtained from chart review. Huck D Christin is a 63 y.o. male who has a PMH including but not limited to tobacco, abuse, AAA, NSTEMI, HTN, HLD, CAD, skin CA, GERD.  He presented to [facility] 9/30 for repair of 7cm AAA and 5cm R common iliac artery aneurysm.  He had significant blood loss intraoperatively and required 5L IVF's, 2u PRBC, 2 FFP, 2.5L cell saver, and neosynephrine.  Just prior to leaving PACU, he was noted to be hypertensive and was started on cleviprex.  Neosynephrine was discontinued upon arrival to ICU and cleviprex is being titrated down.  Past Medical History  tobacco, abuse, AAA, NSTEMI, HTN, HLD, CAD, skin CA, GERD.  Significant Hospital Events   9/30 > admitted for OR repair of 7cm AAA and 5cm R common iliac artery aneurysm. 10/1-10/2 > Difficult to wean off sedation or vent due to agitation. Started on seroquel 10/3 > Tolerating PS   Consults:  PCCM.  Procedures:  ETT 9/30 >  L rad art line 9/30 >  R swan 9/30 >   Significant Diagnostic Tests:  TTE 06/19/19 - EF 60-65% No WMA or valvular abnormalities  Micro Data:  Blood 10/3>>>NTD Urine 10/3>>>NTD Sputum 10/3>>>E coli resistant to amp and unasyn, sensitive to zosyn  Antimicrobials:  Vanc 10/3>>>10/5 Zosyn 10/3>>>  Interim history/subjective:  Febrile overnight Did not wean this AM   Objective:  Blood pressure (!) 78/62, pulse 86, temperature  99.3 F (37.4 C), temperature source Oral, resp. rate (!) 22, height 6\' 2"  (1.88 m), weight 104.5 kg, SpO2 92 %. PAP: (25-30)/(14-19) 30/19 CVP:  [7 mmHg] 7 mmHg  Vent Mode: PRVC FiO2 (%):  [40 %-60 %] 50 % Set Rate:  [20 bmp] 20 bmp Vt Set:  [650 mL] 650 mL PEEP:  [5 cmH20] 5 cmH20 Pressure Support:  [10 cmH20] 10 cmH20 Plateau Pressure:  [9 [id number] cmH20] 15 cmH20   Intake/Output Summary (Last 24 hours) at 06/23/2019 1000 Last data filed at 06/23/2019 0600 Gross per 24 hour  Intake 1244.45 ml  Output 2199 ml  Net -954.55 ml   Filed Weights   06/22/19 0000 06/22/19 0409 06/23/19 0600  Weight: 107.2 kg 107.2 kg 104.5 kg   Physical Exam: General: Acute on chronically ill appearing male, NAD HENT: [place]/AT, PERRL, EOM-I and MMM, ETT in place Respiratory: Coarse bilaterally Cardiovascular: RRR, Nl S1/S2 and -M/R/G GI: Soft, NT, ND and +BS Extremities: -Edema,-tenderness, cool lower extremities, distal pulses on doppler Neuro: Sedate, not following command Skin: Pale mottled skin in lower extremities, improved per staff  I reviewed CXR myself, ETT in place, mild pulmonary edema  Assessment & Plan:  63 year old male with AAA and bilateral common iliac aneurysms s/p elective open AAA repair with intra-op EBL 6600 ml. He was resuscitated with 5.2L LR, 2.5L cellsaver, 2U PRBC and 2U FFP.   Hemorrhagic Shock - resolved Septic shock - Wean levophed for  systolic BP of [id number] - Trend CBC.  Transfuse for Hg goal >7 - D/C Vanc - Continue zosyn - F/U on culture - PCT 7.14 - Cortisol level 25.4 - Continue stress dose steroids while hypotensive   Agitation requiring sedation - RASS goal 0 and -1 - Wean Fentanyl and Precedex as able - Continue seroquel BID  Acute respiratory failure - Continue to hold weaning while hemodynamics are poor - Titrate O2 for sat of 88-92% - VAP prevention protocol - KVO IVF - Increase PEEP to 10 and FiO2 to 60%  AKI, oliguric. Likely ATN in setting of  shock - Trend BMP/UOP - Lasix 2 doses of 40 given today - Monitor CVP  Incidental AAA s/p open repair  - 7.4cm infrarenal aneurysm, EBL 6 units with 2.5 units returned via cell-saver  - Was hypotensive peri-operatively and started on Neo, but then hypertensive post-op and initiated on Cleviprex - Post op plan per vascular - KUB per vascular  Transaminitis - Likely ischemic hepatitis - Trend CMP - Restart TF - Free water as ordered  Acute blood loss anemia Post-op thrombocytopenia - S/p 1U Plt for goal > 50K - Trend CBC - Transfuse 1U Plt today   Hx HTN, CAD, NSTEMI - Hold home lopressor, plavix, Asa and statin  Hypomag - Replete  Labs   CBC: Recent Labs  Lab 06/19/19 0806 06/20/19 0326  06/21/19 0315  06/21/19 1136 06/21/19 1153 06/22/19 0311 06/23/19 0339 06/23/19 0351  WBC 14.9* 17.9*  --  9.8  --   --  12.5* 11.1* 10.0  --   NEUTROABS 11.6*  --   --   --   --   --   --  9.0*  --   --   HGB 11.9* 11.0*   < > 9.6*   < > 9.9* 10.0* 8.7* 8.8* 9.9*  HCT 36.1* 33.8*   < > 28.8*   < > 29.0* 29.5* 27.5* 25.8* 29.0*  MCV 93.0 94.4  --  93.5  --   --  93.4 96.5 93.1  --   PLT 55* 70*  --  51*  --   --  96* 68* 75*  --    < > = values in this interval not displayed.   Basic Metabolic Panel: Recent Labs  Lab 06/20/19 0912  06/20/19 1700 06/21/19 0315  06/21/19 1136 06/21/19 1153 06/21/19 1645 06/22/19 0311 06/23/19 0339 06/23/19 0351  NA  --    < > 141 142   < > 148* 149*  --  144 149* 149*  K  --    < > 3.9 3.7   < > 3.2* 3.4*  --  3.6 3.3* 3.3*  CL  --   --  110 111  --   --  111  --  110 114*  --   CO2  --   --  20* 21*  --   --  24  --  24 25  --   GLUCOSE  --   --  110* 156*  --   --  126*  --  109* 105*  --   BUN  --   --  49* 53*  --   --  53*  --  52* 50*  --   CREATININE  --   --  2.07* 2.15*  --   --  2.10*  --  1.92* 1.83*  --   CALCIUM  --   --  6.6* 6.5*  --   --  6.5*  --  6.8* 7.0*  --   MG 1.3*  --  1.4* 1.5*  --   --  1.5* 1.9 2.0 2.2  --    PHOS 4.7*  --  4.0 3.8  --   --   --  4.4  --  3.8  --    < > = values in this interval not displayed.   GFR: Estimated Creatinine Clearance: 51.9 mL/min (A) (by C-G formula based on SCr of 1.83 mg/dL (H)). Recent Labs  Lab 06/19/19 0807  06/21/19 0315 06/21/19 1153 06/21/19 1423 06/21/19 1908 06/21/19 2227 06/22/19 0311 06/22/19 0938 06/23/19 0339  PROCALCITON  --   --   --   --   --   --   --   --  7.57 7.14  WBC  --    < > 9.8 12.5*  --   --   --  11.1*  --  10.0  LATICACIDVEN 3.2*  --   --   --  2.7* 2.4* 2.0*  --   --   --    < > = values in this interval not displayed.   Liver Function Tests: Recent Labs  Lab 06/19/19 0000 06/19/19 0806 06/20/19 0326 06/21/19 0315 06/22/19 0311  AST 364* 1,432* 1,150* 412* 160*  ALT 198* 511* 446* 268* 154*  ALKPHOS 51 35* 37* 43 53  BILITOT 1.7* 2.4* 3.0* 2.8* 4.0*  PROT 4.7* 4.4* 4.4* 4.4* 4.4*  ALBUMIN 2.6* 3.1* 2.5* 1.8* 1.8*   Recent Labs  Lab 06/19/19 0000 06/22/19 0311  LIPASE  --  57*  AMYLASE 485*  --    No results for input(s): AMMONIA in the last 168 hours. ABG    Component Value Date/Time   PHART 7.473 (H) 06/23/2019 0351   PCO2ART 35.7 06/23/2019 0351   PO2ART 82.0 (L) 06/23/2019 0351   HCO3 26.3 06/23/2019 0351   TCO2 27 06/23/2019 0351   ACIDBASEDEF 4.0 (H) 06/21/2019 0346   O2SAT 97.0 06/23/2019 0351    Coagulation Profile: Recent Labs  Lab 06/03/2019 1651  INR 1.5*   Cardiac Enzymes: No results for input(s): CKTOTAL, CKMB, CKMBINDEX, TROPONINI in the last 168 hours. HbA1C: Hgb A1c MFr Bld  Date/Time Value Ref Range Status  12/10/2017 09:20 AM 5.9 4.6 - 6.5 % Final    Comment:    Glycemic Control Guidelines for People with Diabetes:Non Diabetic:  <6%Goal of Therapy: <7%Additional Action Suggested:  >8%   06/29/2016 08:11 AM 5.7 4.6 - 6.5 % Final    Comment:    Glycemic Control Guidelines for People with Diabetes:Non Diabetic:  <6%Goal of Therapy: <7%Additional Action Suggested:  >8%     CBG: Recent Labs  Lab 06/22/19 1548 06/22/19 2017 06/22/19 2355 06/23/19 0446 06/23/19 0753  GLUCAP 95 92 90 98 92   The patient is critically ill with multiple organ systems failure and requires high complexity decision making for assessment and support, frequent evaluation and titration of therapies, application of advanced monitoring technologies and extensive interpretation of multiple databases.   Critical Care Time devoted to patient care services described in this note is  32  Minutes. This time reflects time of care of this signee Dr [name]. This critical care time does not reflect procedure time, or teaching time or supervisory time of PA/NP/Med student/Med Resident etc but could involve care discussion time.  Rush Farmer, M.D. [facility] Pulmonary/Critical Care Medicine. Pager: [phone]. After hours pager: [phone].

## 2019-06-23 NOTE — Plan of Care (Signed)
  Problem: Clinical Measurements: Goal: Ability to maintain clinical measurements within normal limits will improve Outcome: Progressing Goal: Will remain free from infection Outcome: Progressing Goal: Diagnostic test results will improve Outcome: Progressing Goal: Cardiovascular complication will be avoided Outcome: Progressing   Problem: Education: Goal: Knowledge of General Education information will improve Description: Including pain rating scale, medication(s)/side effects and non-pharmacologic comfort measures Outcome: Not Progressing   Problem: Health Behavior/Discharge Planning: Goal: Ability to manage health-related needs will improve Outcome: Not Progressing   Problem: Clinical Measurements: Goal: Respiratory complications will improve Outcome: Not Progressing

## 2019-06-23 NOTE — Progress Notes (Signed)
OT Cancellation Note  Patient Details Name: Lawrence Wallace MRN: [id number] DOB: [birth year]   Cancelled Treatment:    Reason Eval/Treat Not Completed: Patient not medically ready pt sedated, intubated and in restraints.  Hold per RN.  Will follow and see as medically able and appropriate.   Delight Stare, OT Acute Rehabilitation Services Pager [phone] Office [phone]   Delight Stare 06/23/2019, 9:33 AM

## 2019-06-24 ENCOUNTER — Inpatient Hospital Stay (HOSPITAL_COMMUNITY): Payer: Medicare Other

## 2019-06-24 DIAGNOSIS — R579 Shock, unspecified: Secondary | ICD-10-CM

## 2019-06-24 LAB — BASIC METABOLIC PANEL
Anion gap: 14 (ref 5–15)
Anion gap: 12 (ref 5–15)
BUN: 69 mg/dL — ABNORMAL HIGH (ref 8–23)
BUN: 83 mg/dL — ABNORMAL HIGH (ref 8–23)
CO2: 24 mmol/L (ref 22–32)
CO2: 24 mmol/L (ref 22–32)
Calcium: 7 mg/dL — ABNORMAL LOW (ref 8.9–10.3)
Calcium: 6.8 mg/dL — ABNORMAL LOW (ref 8.9–10.3)
Chloride: 114 mmol/L — ABNORMAL HIGH (ref 98–111)
Chloride: 116 mmol/L — ABNORMAL HIGH (ref 98–111)
Creatinine, Ser: 2.37 mg/dL — ABNORMAL HIGH (ref 0.61–1.24)
Creatinine, Ser: 2.65 mg/dL — ABNORMAL HIGH (ref 0.61–1.24)
GFR calc Af Amer: 32 mL/min — ABNORMAL LOW (ref >60)
GFR calc Af Amer: 28 mL/min — ABNORMAL LOW (ref >60)
GFR calc non Af Amer: 28 mL/min — ABNORMAL LOW (ref >60)
GFR calc non Af Amer: 24 mL/min — ABNORMAL LOW (ref >60)
Glucose, Bld: 114 mg/dL — ABNORMAL HIGH (ref 70–99)
Glucose, Bld: 142 mg/dL — ABNORMAL HIGH (ref 70–99)
Potassium: 3.3 mmol/L — ABNORMAL LOW (ref 3.5–5.1)
Potassium: 3.4 mmol/L — ABNORMAL LOW (ref 3.5–5.1)
Sodium: 152 mmol/L — ABNORMAL HIGH (ref 135–145)
Sodium: 152 mmol/L — ABNORMAL HIGH (ref 135–145)

## 2019-06-24 LAB — GLUCOSE, CAPILLARY
Glucose-Capillary: 109 mg/dL — ABNORMAL HIGH (ref 70–99)
Glucose-Capillary: 110 mg/dL — ABNORMAL HIGH (ref 70–99)
Glucose-Capillary: 112 mg/dL — ABNORMAL HIGH (ref 70–99)
Glucose-Capillary: 113 mg/dL — ABNORMAL HIGH (ref 70–99)
Glucose-Capillary: 116 mg/dL — ABNORMAL HIGH (ref 70–99)
Glucose-Capillary: 141 mg/dL — ABNORMAL HIGH (ref 70–99)

## 2019-06-24 LAB — HEMOGLOBIN A1C
Hgb A1c MFr Bld: 5.7 % — ABNORMAL HIGH (ref 4.8–5.6)
Mean Plasma Glucose: 116.89 mg/dL

## 2019-06-24 LAB — CBC
HCT: 25.7 % — ABNORMAL LOW (ref 39.0–52.0)
Hemoglobin: 8.4 g/dL — ABNORMAL LOW (ref 13.0–17.0)
MCH: 31.2 pg (ref 26.0–34.0)
MCHC: 32.7 g/dL (ref 30.0–36.0)
MCV: 95.5 fL (ref 80.0–100.0)
Platelets: 119 10*3/uL — ABNORMAL LOW (ref 150–400)
RBC: 2.69 MIL/uL — ABNORMAL LOW (ref 4.22–5.81)
RDW: 15.1 % (ref 11.5–15.5)
WBC: 15.1 10*3/uL — ABNORMAL HIGH (ref 4.0–10.5)
nRBC: 0.2 % (ref 0.0–0.2)

## 2019-06-24 LAB — POCT I-STAT 7, (LYTES, BLD GAS, ICA,H+H)
Acid-Base Excess: 2 mmol/L (ref 0.0–2.0)
Bicarbonate: 26.1 mmol/L (ref 20.0–28.0)
Calcium, Ion: 1.08 mmol/L — ABNORMAL LOW (ref 1.15–1.40)
HCT: 23 % — ABNORMAL LOW (ref 39.0–52.0)
Hemoglobin: 7.8 g/dL — ABNORMAL LOW (ref 13.0–17.0)
O2 Saturation: 98 %
Patient temperature: 98.6
Potassium: 3.3 mmol/L — ABNORMAL LOW (ref 3.5–5.1)
Sodium: 151 mmol/L — ABNORMAL HIGH (ref 135–145)
TCO2: 27 mmol/L (ref 22–32)
pCO2 arterial: 39.8 mmHg (ref 32.0–48.0)
pH, Arterial: 7.425 (ref 7.350–7.450)
pO2, Arterial: 103 mmHg (ref 83.0–108.0)

## 2019-06-24 LAB — PHOSPHORUS: Phosphorus: 5.5 mg/dL — ABNORMAL HIGH (ref 2.5–4.6)

## 2019-06-24 LAB — PROCALCITONIN: Procalcitonin: 8.04 ng/mL

## 2019-06-24 LAB — MAGNESIUM: Magnesium: 2.4 mg/dL (ref 1.7–2.4)

## 2019-06-24 MED ORDER — POTASSIUM CHLORIDE 20 MEQ/15ML (10%) PO SOLN
40.0000 meq | Freq: Three times a day (TID) | ORAL | Status: AC
  Administered 2019-06-24 (×2): 40 meq
  Filled 2019-06-24 (×2): qty 30

## 2019-06-24 MED ORDER — PRO-STAT SUGAR FREE PO LIQD
30.0000 mL | Freq: Three times a day (TID) | ORAL | Status: DC
  Administered 2019-06-24 – 2019-06-26 (×6): 30 mL
  Filled 2019-06-24 (×6): qty 30

## 2019-06-24 MED ORDER — METOLAZONE 5 MG PO TABS
10.0000 mg | ORAL_TABLET | Freq: Once | ORAL | Status: AC
  Administered 2019-06-24: 10 mg via ORAL
  Filled 2019-06-24: qty 2

## 2019-06-24 MED ORDER — DEXTROSE 5 % IV SOLN
INTRAVENOUS | Status: DC
  Administered 2019-06-24 – 2019-06-26 (×4): via INTRAVENOUS

## 2019-06-24 MED ORDER — INSULIN ASPART 100 UNIT/ML ~~LOC~~ SOLN
0.0000 [IU] | SUBCUTANEOUS | Status: DC
  Administered 2019-06-25 (×5): 2 [IU] via SUBCUTANEOUS
  Administered 2019-06-25: 1 [IU] via SUBCUTANEOUS
  Administered 2019-06-26 (×4): 2 [IU] via SUBCUTANEOUS
  Administered 2019-06-26 – 2019-06-27 (×4): 1 [IU] via SUBCUTANEOUS
  Administered 2019-06-27: 3 [IU] via SUBCUTANEOUS
  Administered 2019-06-27: 12:00:00 5 [IU] via SUBCUTANEOUS

## 2019-06-24 MED ORDER — VITAL 1.5 CAL PO LIQD
1000.0000 mL | ORAL | Status: DC
  Administered 2019-06-24: 20 mL
  Administered 2019-06-26 – 2019-06-27 (×2): 1000 mL
  Filled 2019-06-24 (×6): qty 1000

## 2019-06-24 MED ORDER — SODIUM CHLORIDE 0.45 % IV SOLN
INTRAVENOUS | Status: DC
  Administered 2019-06-24 (×2): via INTRAVENOUS

## 2019-06-24 MED ORDER — CALCIUM GLUCONATE-NACL 1-0.675 GM/50ML-% IV SOLN
1.0000 g | Freq: Once | INTRAVENOUS | Status: AC
  Administered 2019-06-24: 1000 mg via INTRAVENOUS
  Filled 2019-06-24: qty 50

## 2019-06-24 MED ORDER — POTASSIUM CHLORIDE 20 MEQ/15ML (10%) PO SOLN
20.0000 meq | ORAL | Status: AC
  Administered 2019-06-24 (×2): 20 meq via ORAL
  Filled 2019-06-24 (×2): qty 15

## 2019-06-24 MED ORDER — POTASSIUM CHLORIDE 20 MEQ/15ML (10%) PO SOLN: 20.0000 meq | Freq: Once | ORAL | Status: DC

## 2019-06-24 MED ORDER — FUROSEMIDE 10 MG/ML IJ SOLN
10.0000 mg/h | INTRAVENOUS | Status: AC
  Administered 2019-06-24: 12:00:00 10 mg/h via INTRAVENOUS
  Filled 2019-06-24: qty 25

## 2019-06-24 NOTE — Progress Notes (Signed)
[facility] [place]  Subjective  -remains intubated.  Increasing vent requirements yesterday evening.  Postop day 6 status post open abdominal aortic and right common iliac aneurysm repair with bifurcated graft.     Objective 112/60 83 98.4 F (36.9 C) (Oral) 18 95%  Intake/Output Summary (Last 24 hours) at 06/24/2019 0901 Last data filed at 06/24/2019 0800 Gross per 24 hour  Intake 2141.48 ml  Output 3210 ml  Net -1068.52 ml   Intubated, sedated, GCS 3 T Midline incision c/d/i Distension improved Femoral pulses are palpable bilaterally Brisk doppler posterior tibial and dorsalis pedis signals bilaterally Left radial occluded distally, left ulnar artery triphasic, duskiness of left middle finger improved  Laboratory Lab Results: Recent Labs    06/23/19 0339  06/24/19 0330 06/24/19 0428  WBC 10.0  --   --  15.1*  HGB 8.8*   < > 7.8* 8.4*  HCT 25.8*   < > 23.0* 25.7*  PLT 75*  --   --  119*   < > = values in this interval not displayed.   BMET Recent Labs    06/23/19 0339  06/24/19 0330 06/24/19 0428  NA 149*   < > 151* 152*  K 3.3*   < > 3.3* 3.3*  CL 114*  --   --  114*  CO2 25  --   --  24  GLUCOSE 105*  --   --  114*  BUN 50*  --   --  69*  CREATININE 1.83*  --   --  2.37*  CALCIUM 7.0*  --   --  7.0*   < > = values in this interval not displayed.    COAG Lab Results  Component Value Date   INR 1.5 (H) 06/16/2019   INR 1.0 06/16/2019   No results found for: PTT  Assessment/Planning:  Postop day 6 status post open aortic aneurysm repair as well as right common iliac aneurysm repair with bifurcated dacron graft.  Remains intubated in the ICU critically ill.  Neuro: GCS 3 T this morning and intubated and sedated.  Encephalopathy. Very agitated when sedation weaned.  On precedex and low dose fentanyl. Cardiovascular: Vasopressin weaned off, Levophed down to 5 mcg.  Hgb 9.6 --> 9.9 --> 10 --> 8.7 --> 8.8. --> 8.4.  Platelets  improving.  HIT negative.   Pulmonary: Remains intubated.  Vent on Peep 10 and FiO2 100% today, lasix yesterday for diuresis, needs improvement of pulmonary funtion, e. Coli pneumonia GI: CT abd/pelvis yesterday Saturday, no signs of thickened bowel, pnematosis, or free air over weekend.  No plans for abdominal exploration.  Had normal BM Saturday.   Place transpyloric tube and start tube feeds at low rate Renal: Diuresis yesterday.  Cr 2.22 --> 2.07 --> 2.15 --> 1.92 --> 1.83 --> 2.37 after diuresis. Liver: Shock liver, enzymes improved FEN: pH improved 7.4. ID: BC no growth to date, tracheal aspirate e coli.  on vanc zosyn, suspect pneumonia, e. Coli pneumonia.  Will follow cultures. Continue lovenox daily for DVT prophylaxis.      Appreciate critical care support. Continue to wean pressors.  Wean vent.  Discussed with Dr. Nelda Marseille this morning and he will try Lasix drip today to try and wean his vent even though his creatinine took a bit of a bump yesterday.  Certainly may end up requiring CRRT to get volume off so he can be extubated.  [name], MD [facility] [place] Office: [phone]  Pager: [phone]   [name] 06/24/2019 9:01 AM --

## 2019-06-24 NOTE — Consult Note (Addendum)
[facility] Renal Consultation Note    Indication for Consultation:  Management of ESRD/hemodialysis; anemia, hypertension/volume and secondary hyperparathyroidism PCP: Vascular: Dr. Carlis Abbott Pulmonary Critical care  HPI: Lawrence Wallace is a 63 y.o. male with PMH of CAD, NSTEMI S/P PTCI to mid circ 12/2018, HTN, HLD, tobacco abuse. Patient was admitted by Dr. Carlis Abbott 05/25/2019 for repair of 7 cm abdominal aortic aneurysm and right common iliac aneurysm repair. Post Op course complicated by EBL 5176 with hypovolemic shock requiring pressors and transfusion. Course further complicated by acute hypoxemic respiratory failure and sepsis-tracheal  aspirate positive for E. Coli-he was started on Vancomycin and Zosyn which has now been narrowed to zosyn. Critical care following for vent management.   SCr prior to admission was 0.72-0.8 with EGFR >60. Since admission, SCr has progressively climbed to 2.37 BUN 69 EGFR 28 NA 152 Ca 7.0 C Ca 8.76 Phos 5.5 today. Urine output progressively declined, he is currently positive 10,476 cc. He has been started on furosemide drip at 10 mg/hr with one time dose of metolazone 10 mg PO today. Urine output has increased but remains overtly volume overloaded. Pressors have been weaned to low dose levophed at 4 mcg/min. He is not acidotic. Last HGB 8.4. He is sedated on full ventilator support.   Past Medical History:  Diagnosis Date  . Asthma    as a child  . Blood loss anemia   . CAD in native artery    a. cath 12/2018 - total occlusion of the mid Circumflex artery s/p successful PTCA/DES x 1. Moderate non-obstructive disease in the mid RCA and mid LAD.  [facility] (HCC)    Skin  . Coronary artery disease   . GERD (gastroesophageal reflux disease)   . Hyperlipidemia LDL goal <70 01/17/2019  . Hypertension 01/17/2019  . NSTEMI (non-ST elevated myocardial infarction) ([place]) 01/15/2019  . Tobacco abuse    Past Surgical History:  Procedure Laterality Date  .  ABDOMINAL AORTIC ANEURYSM REPAIR N/A 06/03/2019   Procedure: REPAIR OF  ABDOMINAL AORTIC ANEURYSM USING 18 X 9MM X 40 CM HEMASHIELD GOLD GRAFT;  Surgeon: [name], MD;  Location: [facility];  Service: Vascular;  Laterality: N/A;  . CORONARY STENT INTERVENTION N/A 01/16/2019   Procedure: CORONARY STENT INTERVENTION;  Surgeon: Burnell Blanks, MD;  Location: [facility];  Service: Cardiovascular;  Laterality: N/A;  . EYE SURGERY Left 1983   glass remmoved  . FOOT FRACTURE SURGERY Right   . LEFT HEART CATH AND CORONARY ANGIOGRAPHY N/A 01/16/2019   Procedure: LEFT HEART CATH AND CORONARY ANGIOGRAPHY;  Surgeon: Burnell Blanks, MD;  Location: [facility];  Service: Cardiovascular;  Laterality: N/A;   Family History  Problem Relation Age of Onset  . Stroke Mother   . Cancer Mother   . Cancer Father    Social History:  reports that he has been smoking cigarettes. He has a 33.75 pack-year smoking history. He has never used smokeless tobacco. He reports current alcohol use of about 6.0 standard drinks of alcohol per week. He reports that he does not use drugs. No Known Allergies Prior to Admission medications   Medication Sig Start Date End Date Taking? Authorizing Provider  aspirin EC 81 MG tablet Take 1 tablet (81 mg total) by mouth daily. 06/11/19  Yes Weaver, Scott T, PA-C  metoprolol tartrate (LOPRESSOR) 25 MG tablet Take 1 tablet (25 mg total) by mouth 2 (two) times daily. 05/22/19  Yes Sherren Mocha, MD  Multiple Vitamin (MULTIVITAMIN) tablet Take 1 tablet by mouth daily.   Yes [provider]  nitroGLYCERIN (NITROSTAT) 0.4 MG SL tablet Place 1 tablet (0.4 mg total) under the tongue every 5 (five) minutes x 3 doses as needed for chest pain. 05/22/19  Yes Sherren Mocha, MD  rosuvastatin (CRESTOR) 40 MG tablet Take 1 tablet (40 mg total) by mouth daily. Patient taking differently: Take 40 mg by mouth daily at 6 PM.  05/22/19  Yes Sherren Mocha, MD   varenicline (CHANTIX STARTING MONTH PAK) 0.5 MG X 11 & 1 MG X 42 tablet Take one 0.5 mg tablet by mouth once daily for 3 days, then increase to one 0.5 mg tablet twice daily for 4 days, then increase to one 1 mg tablet twice daily. Patient taking differently: Take 1 mg by mouth 2 (two) times daily. Not taking 05/08/19  Yes Pleas Koch, NP  clopidogrel (PLAVIX) 75 MG tablet Take 1 tablet (75 mg total) by mouth daily. 05/30/19   Leanor Kail, PA   Current Facility-Administered Medications  Medication Dose Route Frequency Provider Last Rate Last Dose  . 0.45 % sodium chloride infusion   Intravenous Continuous Anders Simmonds, MD 50 mL/hr at 06/24/19 1512    . 0.9 %  sodium chloride infusion (Manually program via Guardrails IV Fluids)   Intravenous Once Rhyne, Samantha J, PA-C      . 0.9 %  sodium chloride infusion   Intra-arterial PRN [name], MD 10 mL/hr at 06/24/19 1200    . acetaminophen (TYLENOL) solution 325-650 mg  325-650 mg Per Tube Q4H PRN [name], MD   650 mg at 06/22/19 0813  . alum & mag hydroxide-simeth (MAALOX/MYLANTA) 200-200-20 MG/5ML suspension 15-30 mL  15-30 mL Oral Q2H PRN Rhyne, Samantha J, PA-C      . bisacodyl (DULCOLAX) suppository 10 mg  10 mg Rectal Daily PRN Rhyne, Samantha J, PA-C      . chlorhexidine gluconate (MEDLINE KIT) (PERIDEX) 0.12 % solution 15 mL  15 mL Mouth Rinse BID [name], MD   15 mL at 06/24/19 0843  . Chlorhexidine Gluconate Cloth 2 % PADS 6 each  6 each Topical Daily [name], MD   6 each at 06/24/19 1041  . dexmedetomidine (PRECEDEX) 400 MCG/100ML (4 mcg/mL) infusion  0.4-1.2 mcg/kg/hr Intravenous Titrated Margaretha Seeds, MD 20.8 mL/hr at 06/24/19 1200 0.8 mcg/kg/hr at 06/24/19 1200  . docusate (COLACE) 50 MG/5ML liquid 100 mg  100 mg Oral Daily Vertis Kelch L, RPH   100 mg at 06/24/19 1016  . enoxaparin (LOVENOX) injection 30 mg  30 mg Subcutaneous Q24H [name], MD   30 mg  at 06/24/19 1139  . feeding supplement (PRO-STAT SUGAR FREE 64) liquid 30 mL  30 mL Per Tube TID Rush Farmer, MD   30 mL at 06/24/19 1515  . feeding supplement (VITAL 1.5 CAL) liquid 1,000 mL  1,000 mL Per Tube Continuous Rush Farmer, MD      . fentaNYL (SUBLIMAZE) bolus via infusion 25 mcg  25 mcg Intravenous Q15 min PRN Margaretha Seeds, MD   25 mcg at 06/22/19 2248  . fentaNYL (SUBLIMAZE) injection 50-200 mcg  50-200 mcg Intravenous Q30 min PRN Shearon Stalls, Rahul P, PA-C   50 mcg at 06/23/19 1940  . fentaNYL 2573mg in NS 2551m(1028mml) infusion-PREMIX  25-200 mcg/hr Intravenous Continuous EllMargaretha SeedsD 10 mL/hr at 06/24/19 1506 100 mcg/hr at 06/24/19 1506  .  free water 250 mL  250 mL Per Tube Q4H Rush Farmer, MD   250 mL at 06/24/19 1456  . furosemide (LASIX) 250 mg in dextrose 5 % 250 mL (1 mg/mL) infusion  10 mg/hr Intravenous Continuous Rush Farmer, MD 10 mL/hr at 06/24/19 1200 10 mg/hr at 06/24/19 1200  . hydrALAZINE (APRESOLINE) injection 5 mg  5 mg Intravenous Q20 Min PRN Rhyne, Samantha J, PA-C      . hydrocortisone sodium succinate (SOLU-CORTEF) 100 MG injection 50 mg  50 mg Intravenous Q6H Rush Farmer, MD   50 mg at 06/24/19 1516  . labetalol (NORMODYNE) injection 10 mg  10 mg Intravenous Q10 min PRN Rhyne, Samantha J, PA-C      . magnesium sulfate IVPB 2 g 50 mL  2 g Intravenous Once PRN Rhyne, Samantha J, PA-C      . MEDLINE mouth rinse  15 mL Mouth Rinse 10 times per day [name], MD   15 mL at 06/24/19 1450  . metoprolol tartrate (LOPRESSOR) injection 2-5 mg  2-5 mg Intravenous Q2H PRN Rhyne, Samantha J, PA-C   2.5 mg at 06/07/2019 1711  . midazolam (VERSED) injection 1 mg  1 mg Intravenous Q15 min PRN Margaretha Seeds, MD   1 mg at 06/22/19 1554  . midazolam (VERSED) injection 1 mg  1 mg Intravenous Q2H PRN Margaretha Seeds, MD   1 mg at 06/22/19 2245  . norepinephrine (LEVOPHED) 16 mg in 237m premix infusion  0-40 mcg/min Intravenous Titrated  CMarty Heck MD 3.75 mL/hr at 06/24/19 1200 4 mcg/min at 06/24/19 1200  . ondansetron (ZOFRAN) injection 4 mg  4 mg Intravenous Q6H PRN Rhyne, Samantha J, PA-C      . pantoprazole (PROTONIX) injection 40 mg  40 mg Intravenous QHS Rhyne, Samantha J, PA-C   40 mg at 06/23/19 2100  . phenol (CHLORASEPTIC) mouth spray 1 spray  1 spray Mouth/Throat PRN Rhyne, Samantha J, PA-C      . piperacillin-tazobactam (ZOSYN) IVPB 3.375 g  3.375 g Intravenous Q8H MKris Mouton RPH 12.5 mL/hr at 06/24/19 1248 3.375 g at 06/24/19 1248  . QUEtiapine (SEROQUEL) tablet 25 mg  25 mg Oral BID EMargaretha Seeds MD   25 mg at 06/24/19 1015  . sodium chloride flush (NS) 0.9 % injection 10-40 mL  10-40 mL Intracatheter Q12H CMarty Heck MD   10 mL at 06/24/19 1026  . sodium chloride flush (NS) 0.9 % injection 10-40 mL  10-40 mL Intracatheter PRN CMarty Heck MD      . sodium chloride flush (NS) 0.9 % injection 10-40 mL  10-40 mL Intracatheter Q12H CMarty Heck MD   10 mL at 06/24/19 1026  . sodium chloride flush (NS) 0.9 % injection 10-40 mL  10-40 mL Intracatheter PRN CMarty Heck MD      . vasopressin (PITRESSIN) 40 Units in sodium chloride 0.9 % 250 mL (0.16 Units/mL) infusion  0.04 Units/min Intravenous Continuous EMargaretha Seeds MD   Stopped at 06/22/19 1113   Labs: Basic Metabolic Panel: Recent Labs  Lab 06/21/19 1645 06/22/19 0311 06/23/19 0339 06/23/19 0351 06/24/19 0330 06/24/19 0428  NA  --  144 149* 149* 151* 152*  K  --  3.6 3.3* 3.3* 3.3* 3.3*  CL  --  110 114*  --   --  114*  CO2  --  24 25  --   --  24  GLUCOSE  --  109* 105*  --   --  114*  BUN  --  52* 50*  --   --  69*  CREATININE  --  1.92* 1.83*  --   --  2.37*  CALCIUM  --  6.8* 7.0*  --   --  7.0*  PHOS 4.4  --  3.8  --   --  5.5*   Liver Function Tests: Recent Labs  Lab 06/20/19 0326 06/21/19 0315 06/22/19 0311  AST 1,150* 412* 160*  ALT 446* 268* 154*  ALKPHOS 37* 43 53  BILITOT  3.0* 2.8* 4.0*  PROT 4.4* 4.4* 4.4*  ALBUMIN 2.5* 1.8* 1.8*   Recent Labs  Lab 06/19/19 0000 06/22/19 0311  LIPASE  --  57*  AMYLASE 485*  --    No results for input(s): AMMONIA in the last 168 hours. CBC: Recent Labs  Lab 06/19/19 0806  06/21/19 0315  06/21/19 1153 06/22/19 0311 06/23/19 0339 06/23/19 0351 06/24/19 0330 06/24/19 0428  WBC 14.9*   < > 9.8  --  12.5* 11.1* 10.0  --   --  15.1*  NEUTROABS 11.6*  --   --   --   --  9.0*  --   --   --   --   HGB 11.9*   < > 9.6*   < > 10.0* 8.7* 8.8* 9.9* 7.8* 8.4*  HCT 36.1*   < > 28.8*   < > 29.5* 27.5* 25.8* 29.0* 23.0* 25.7*  MCV 93.0   < > 93.5  --  93.4 96.5 93.1  --   --  95.5  PLT 55*   < > 51*  --  96* 68* 75*  --   --  119*   < > = values in this interval not displayed.   Cardiac Enzymes: No results for input(s): CKTOTAL, CKMB, CKMBINDEX, TROPONINI in the last 168 hours. CBG: Recent Labs  Lab 06/23/19 2119 06/23/19 2355 06/24/19 0408 06/24/19 0807 06/24/19 1200  GLUCAP 82 112* 113* 110* 109*   Iron Studies: No results for input(s): IRON, TIBC, TRANSFERRIN, FERRITIN in the last 72 hours. Studies/Results: Dg Abd 1 View  Result Date: 06/24/2019 CLINICAL DATA:  Feeding tube placement. EXAM: ABDOMEN - 1 VIEW COMPARISON:  06/21/2019 FINDINGS: Enteric tube extends through the stomach, tip projecting in the expected location of the 2nd to 3rd portion of the duodenum. Nasogastric tube tip lies in the mid stomach stable from the prior study. Loop of mildly dilated small bowel is noted in the left mid abdomen. IMPRESSION: 1. Enteric feeding tube tip projects at the junction of the 2nd to 3rd portion of the duodenum. Electronically Signed   By: Lajean Manes M.D.   On: 06/24/2019 12:26   Dg Chest Port 1 View  Result Date: 06/24/2019 CLINICAL DATA:  63 year old male intubated, status post coronary catheterization. EXAM: PORTABLE CHEST 1 VIEW COMPARISON:  06/23/2019 and earlier. FINDINGS: Portable AP semi upright view at  0506 hours. New tracheal tube appears stable with tip between the level the clavicles and carina. Enteric tube courses to the abdomen, tip not included. Stable right IJ introducer sheath. A right upper extremity approach PICC line has been placed, tip at the lower SVC level. Mildly improved lung volumes. Stable pulmonary vascularity, no edema. No pneumothorax. Mildly improved ventilation at the left lung base with increased visualization of the left hemidiaphragm. Residual veiling and confluent opacity. Normal cardiac size and mediastinal contours. Calcified aortic atherosclerosis. IMPRESSION: 1. Right PICC line placed.  Otherwise stable  lines and tubes. 2. Improved but not normalized ventilation at the left lung base. 3. No new cardiopulmonary abnormality. Electronically Signed   By: Genevie Ann M.D.   On: 06/24/2019 07:53   Dg Chest Port 1 View  Result Date: 06/23/2019 CLINICAL DATA:  Triple a repair.  Endotracheal tube placement. EXAM: PORTABLE CHEST 1 VIEW COMPARISON:  06/21/2019 FINDINGS: The endotracheal tube terminates above the carina. There is a right-sided central venous catheter with tip projecting over the right internal jugular vein. There are worsening bibasilar airspace opacities. The heart size is stable. Aortic calcifications are noted. There are old healed left-sided rib fractures. There is no pneumothorax. IMPRESSION: 1. Lines and tubes as above. 2. Worsening bibasilar airspace opacities favored to represent a combination of small bilateral pleural effusions and atelectasis. Electronically Signed   By: Constance Holster M.D.   On: 06/23/2019 07:44    ROS: As per HPI otherwise negative.   Physical Exam: Vitals:   06/24/19 1400 06/24/19 1415 06/24/19 1430 06/24/19 1445  BP: (!) 109/57 (!) 108/56 (!) 108/59 (!) 107/58  Pulse: 80 80 80 80  Resp: 19 19 19 20   Temp:      TempSrc:      SpO2: 97% 97% 97% 97%  Weight:      Height:         General: Acutely ill older male intubated,  sedated on vent. Head: Normocephalic, atraumatic, sclera non-icteric, mucus membranes are moist Neck: Supple. JVD + 1/4 to mandible (difficult to assess with lines) Lungs: Orally intubated with bilateral breath sounds, symmetrical chest excursions. Decreased in bases, few bibasilar crackles at present. FIO2 0.80% O2 Sats low 90s.  Heart: RRR with S1 S2. No murmurs, rubs, or gallops appreciated. SR on monitor.  Abdomen: Soft, active BS. FT patent with TF infusing. Foley patent dark yellow urine, adequate UOP.  Lower extremities: 1-2+BLE edema, feet and LE mottled, edema in thighs, hands, lower abdomen.  Neuro: Opens eyes to verbal but does not follow commands.  Psych: Unable to assess  Assessment/Plan: 1. S/P AAA repair/right common iliac aneurysm repair-per VVS 2. AKI in setting of hemorrhagic/septic shock, likely ATN-SCr  2.37 BUN 69 NA 152. Started on furosemide drip/metolazone X 1 dose with excellent response so far. Monitor for hypokalemia. Check renal US when more stable. Watch SCr for worsening RF. May ultimately require CRRT. 3. Hypernatremia-change 1/2NS to D5W at 50 cc/hr.   4.  VDRF-per PCCM. High O2 requirements, agitation requiring sedation. 5.  Tracheal  aspirate positive for Ecoli-WBC 15.1 On Zosyn per primary.  6.  Hyportension/volume  -On low dose Levophed-4 mcg/min. BP stable, wean as tolerated. Keep SBP > 100. Currently positive over 10 liters. Agree with continuing furosemide gtt for 24 hrs, reassess volume and RF in AM.  7.  Anemia  - HGB 8.4 today. Follow HGB and transfuse as needed.  8.  Metabolic bone disease - Ca 7.0 C Ca 8.76 Phos 5.5 follow trends 9.  Nutrition -Albumin low 1.8. Prostat, TF. 10.  H/O CAD  Rita H. Owens Shark, NP-C 06/24/2019, 3:40 PM  [employer] [phone]

## 2019-06-24 NOTE — Progress Notes (Addendum)
NAME:  Lawrence Wallace, MRN:  [id number], DOB:  1956/08/28, LOS: 6 ADMISSION DATE:  06/17/2019, CONSULTATION DATE:  05/23/2019 REFERRING MD:  Carlis Abbott  CHIEF COMPLAINT:  Vent Management   Brief History   Lawrence Wallace is a 63 y.o. male who was admitted 9/30 for repair of large AAA.  He had significant intraoperative blood loss with subsequent hypotension requiring 5L IVF's, 2u PRBC, 2 FFP, 2.5L from cellsaver, and neosynephrine.  Upon leaving PACU, he was somewhat hypertensive and was started on cleviprex.  History of present illness   Pt is encephelopathic; therefore, this HPI is obtained from chart review. Lawrence Wallace is a 63 y.o. male who has a PMH including but not limited to tobacco, abuse, AAA, NSTEMI, HTN, HLD, CAD, skin CA, GERD.  He presented to [facility] 9/30 for repair of 7cm AAA and 5cm R common iliac artery aneurysm.  He had significant blood loss intraoperatively and required 5L IVF's, 2u PRBC, 2 FFP, 2.5L cell saver, and neosynephrine.  Just prior to leaving PACU, he was noted to be hypertensive and was started on cleviprex.  Neosynephrine was discontinued upon arrival to ICU and cleviprex is being titrated down.  Past Medical History  tobacco, abuse, AAA, NSTEMI, HTN, HLD, CAD, skin CA, GERD.  Significant Hospital Events   9/30 > admitted for OR repair of 7cm AAA and 5cm R common iliac artery aneurysm. 10/1-10/2 > Difficult to wean off sedation or vent due to agitation. Started on seroquel 10/3 > Tolerating PS   Consults:  PCCM.  Procedures:  ETT 9/30 >  L rad art line 9/30 >  R swan 9/30 >   Significant Diagnostic Tests:  TTE 06/19/19 - EF 60-65% No WMA or valvular abnormalities  Micro Data:  Blood 10/3>>>NTD Urine 10/3>>>NTD Sputum 10/3>>>E coli resistant to amp and unasyn, sensitive to zosyn  Antimicrobials:  Vanc 10/3>>>10/5 Zosyn 10/3>>>  Interim history/subjective:  Febrile overnight Did not wean 10/6 am + 10.8 L Afebrile  Objective:  Blood pressure 112/60, pulse  83, temperature 98.4 F (36.9 C), temperature source Oral, resp. rate 18, height 6\' 2"  (1.88 m), weight 104.4 kg, SpO2 95 %. CVP:  [1 mmHg-7 mmHg] 3 mmHg  Vent Mode: PRVC FiO2 (%):  [60 %-100 %] 100 % Set Rate:  [20 bmp] 20 bmp Vt Set:  [650 mL] 650 mL PEEP:  [10 cmH20] 10 cmH20 Plateau Pressure:  [20 cmH20-25 cmH20] 20 cmH20   Intake/Output Summary (Last 24 hours) at 06/24/2019 0900 Last data filed at 06/24/2019 0800 Gross per 24 hour  Intake 2141.48 ml  Output 3210 ml  Net -1068.52 ml   Filed Weights   06/22/19 0409 06/23/19 0600 06/24/19 0413  Weight: 107.2 kg 104.5 kg 104.4 kg   Physical Exam: General: Acute on chronically ill appearing male, intubated and sedated, NAD HENT: [place]/AT, PERRL, EOM-I and MMM, ETT in place and secure, No LAD Respiratory: Bilateral chest excursion, Coarse bilaterally throughout, diminished per bases L>R, crackles per bases Cardiovascular: S1, S2, RRR, No RMG GI: Soft, NT, ND and +BS Extremities: -Edema,-tenderness, cool lower extremities, distal pulses on doppler, mottling and both feet with line of demarcation around toes  Neuro: Sedated, intubated, not following command Skin: Pale mottled skin in lower extremities, improved per staff  CXR personally reviewed 10/6, Mildly improved lung volumes, Stable pulmonary vascularity, no edema. Mildly improved ventilation at the left lung base with increased visualization of the left hemidiaphragm. Residual veiling and confluent opacity.  Assessment & Plan:  63  year old male with AAA and bilateral common iliac aneurysms s/p elective open AAA repair with intra-op EBL 6600 ml. He was resuscitated with 5.2L LR, 2.5L cellsaver, 2U PRBC and 2U FFP.   Hemorrhagic Shock - resolved Septic shock Off pressors at present HGB 8.4 on 10/6 WBC up trending 10/6, Sputum + for E coli - Pressor goal of [id number] systolic as needed ( off pressors at present) - Trend CBC.  Transfuse for Hg goal >7 - Vanc d/c'd 10/5 - Continue  zosyn - PCT 8.04 - Cortisol level 25.4 - Continue stress dose steroids while hypotensive   Agitation requiring sedation - RASS goal 0 and -1 - Wean Fentanyl and Precedex as able - Continue seroquel BID  Acute respiratory failure + 10.8 L  - Continue to hold weaning while hemodynamics are poor - Start Lasix gtt x 24 hours - Zaroxolyn 10 mg x 1 today  - Wean  FiO2 for sat of 88-92% - VAP prevention protocol - KVO IVF - Wean FiO2 to 70%  AKI, oliguric. Likely ATN in setting of shock Creatinine and BUN up trending 10/6 Hypokalemia Hypernatremia Hyperphos - Will consult renal service>> suspect will need CVVH - Trend BMP/UOP - Lasix gtt x 24 hours - Zaroxolyn  10 x 1 dose  - Monitor CVP - Trend BMET tonight at 1800 ( Watch BUN and Creatinine) - Continue free H2O ( 250 Q 4) - replete electrolytes as needed  Incidental AAA s/p open repair  - 7.4cm infrarenal aneurysm, EBL 6 units with 2.5 units returned via cell-saver  - Was hypotensive peri-operatively and started on Neo, but then hypertensive post-op and initiated on Cleviprex - Post op plan per vascular - KUB per vascular  Transaminitis - Likely ischemic hepatitis - Trend CMP - Restart TF - Free water as ordered - Place Cortrak Feeding tube  Acute blood loss anemia Post-op thrombocytopenia>> resolving  - S/p 1U Plt for goal > 50K - Trend CBC - Transfuse 1U Plt 10/5   Hx HTN, CAD, NSTEMI - Hold home lopressor, plavix, Asa and statin  Hypomag - Replete - Trend   Discussion + 10.8 L, CXR with ? L effusion Will start lasix gtt x 24 hours, Zaroxolyn x 1 10/6 with BMET at 1800 tonight to eval creatinine , Na and BUN  And evaluate for repletion of electrolytes  Labs   CBC: Recent Labs  Lab 06/19/19 0806  06/21/19 0315  06/21/19 1153 06/22/19 0311 06/23/19 0339 06/23/19 0351 06/24/19 0330 06/24/19 0428  WBC 14.9*   < > 9.8  --  12.5* 11.1* 10.0  --   --  15.1*  NEUTROABS 11.6*  --   --   --   --   9.0*  --   --   --   --   HGB 11.9*   < > 9.6*   < > 10.0* 8.7* 8.8* 9.9* 7.8* 8.4*  HCT 36.1*   < > 28.8*   < > 29.5* 27.5* 25.8* 29.0* 23.0* 25.7*  MCV 93.0   < > 93.5  --  93.4 96.5 93.1  --   --  95.5  PLT 55*   < > 51*  --  96* 68* 75*  --   --  119*   < > = values in this interval not displayed.   Basic Metabolic Panel: Recent Labs  Lab 06/20/19 1700 06/21/19 0315  06/21/19 1153 06/21/19 1645 06/22/19 0311 06/23/19 0339 06/23/19 0351 06/24/19 0330 06/24/19 0428  NA 141 142   < >  149*  --  144 149* 149* 151* 152*  K 3.9 3.7   < > 3.4*  --  3.6 3.3* 3.3* 3.3* 3.3*  CL 110 111  --  111  --  110 114*  --   --  114*  CO2 20* 21*  --  24  --  24 25  --   --  24  GLUCOSE 110* 156*  --  126*  --  109* 105*  --   --  114*  BUN 49* 53*  --  53*  --  52* 50*  --   --  69*  CREATININE 2.07* 2.15*  --  2.10*  --  1.92* 1.83*  --   --  2.37*  CALCIUM 6.6* 6.5*  --  6.5*  --  6.8* 7.0*  --   --  7.0*  MG 1.4* 1.5*  --  1.5* 1.9 2.0 2.2  --   --  2.4  PHOS 4.0 3.8  --   --  4.4  --  3.8  --   --  5.5*   < > = values in this interval not displayed.   GFR: Estimated Creatinine Clearance: 40 mL/min (A) (by C-G formula based on SCr of 2.37 mg/dL (H)). Recent Labs  Lab 06/19/19 0807  06/21/19 1153 06/21/19 1423 06/21/19 1908 06/21/19 2227 06/22/19 0311 06/22/19 0938 06/23/19 0339 06/24/19 0428  PROCALCITON  --   --   --   --   --   --   --  7.57 7.14 8.04  WBC  --    < > 12.5*  --   --   --  11.1*  --  10.0 15.1*  LATICACIDVEN 3.2*  --   --  2.7* 2.4* 2.0*  --   --   --   --    < > = values in this interval not displayed.   Liver Function Tests: Recent Labs  Lab 06/19/19 0000 06/19/19 0806 06/20/19 0326 06/21/19 0315 06/22/19 0311  AST 364* 1,432* 1,150* 412* 160*  ALT 198* 511* 446* 268* 154*  ALKPHOS 51 35* 37* 43 53  BILITOT 1.7* 2.4* 3.0* 2.8* 4.0*  PROT 4.7* 4.4* 4.4* 4.4* 4.4*  ALBUMIN 2.6* 3.1* 2.5* 1.8* 1.8*   Recent Labs  Lab 06/19/19 0000 06/22/19 0311   LIPASE  --  57*  AMYLASE 485*  --    No results for input(s): AMMONIA in the last 168 hours. ABG    Component Value Date/Time   PHART 7.425 06/24/2019 0330   PCO2ART 39.8 06/24/2019 0330   PO2ART 103.0 06/24/2019 0330   HCO3 26.1 06/24/2019 0330   TCO2 27 06/24/2019 0330   ACIDBASEDEF 4.0 (H) 06/21/2019 0346   O2SAT 98.0 06/24/2019 0330    Coagulation Profile: Recent Labs  Lab 06/01/2019 1651  INR 1.5*   Cardiac Enzymes: No results for input(s): CKTOTAL, CKMB, CKMBINDEX, TROPONINI in the last 168 hours. HbA1C: Hgb A1c MFr Bld  Date/Time Value Ref Range Status  12/10/2017 09:20 AM 5.9 4.6 - 6.5 % Final    Comment:    Glycemic Control Guidelines for People with Diabetes:Non Diabetic:  <6%Goal of Therapy: <7%Additional Action Suggested:  >8%   06/29/2016 08:11 AM 5.7 4.6 - 6.5 % Final    Comment:    Glycemic Control Guidelines for People with Diabetes:Non Diabetic:  <6%Goal of Therapy: <7%Additional Action Suggested:  >8%    CBG: Recent Labs  Lab 06/23/19 1540 06/23/19 2119 06/23/19 2355 06/24/19 0408 06/24/19 [id number]  GLUCAP 74 82 112* 113* 110*   CC APP time 40 minutes  Magdalen Spatz, MSN, AGACNP-BC [place] Pager # [phone] After 4 pm please call [phone] 06/24/2019 9:00 AM  Attending Note:  63 year old male with AAA repair and concern for ischemic bowel that now has subsided.  Patient remains on low dose levophed and low UOP overnight.  On exam, he is arousable but not following commands.  I reviewed CXR myself, ETT is in a good position with pulmonary edema noted.  Discussed with VVS-MD.  Will start lasix drip today.  Continue abx as ordered.  TF after a post pyloric panda.  Repeat BMET this afternoon.  Consult with nephrology.  Will likely require CRRT.  KVO IVF.  Free water as ordered.  Zaroxolyn ordered.  PCCM will continue to follow.  The patient is critically ill with multiple organ systems failure and requires high  complexity decision making for assessment and support, frequent evaluation and titration of therapies, application of advanced monitoring technologies and extensive interpretation of multiple databases.   Critical Care Time devoted to patient care services described in this note is  33  Minutes. This time reflects time of care of this signee Dr [name]. This critical care time does not reflect procedure time, or teaching time or supervisory time of PA/NP/Med student/Med Resident etc but could involve care discussion time.  Rush Farmer, M.D. [facility] Pulmonary/Critical Care Medicine. Pager: [phone]. After hours pager: [phone].

## 2019-06-24 NOTE — Progress Notes (Signed)
PT Cancellation Note  Patient Details Name: Lawrence Wallace MRN: [id number] DOB: [birth year]   Cancelled Treatment:    Reason Eval/Treat Not Completed: Patient not medically ready. Pt remains intubated and sedated. Acute PT will sign off. Please reconsult when pt medically appropriate to participate in evaluation.  Mabeline Caras, PT, DPT Acute Rehabilitation Services  Pager [phone] Office [place] 06/24/2019, 10:57 AM

## 2019-06-24 NOTE — Progress Notes (Signed)
eLink Physician-Brief Progress Note Patient Name: Lawrence Wallace DOB: September 06, 1956 MRN: [id number]   Date of Service  06/24/2019  HPI/Events of Note  K+ = 3.3, Na+ = 152 and Creatinine = 2.37.  eICU Interventions  Will order: 1. Cautiously replace K+. 2. 0.45 NaCl to run IV at 50 mL/hour.      Intervention Category Major Interventions: Electrolyte abnormality - evaluation and management  Fionna Merriott [place] 06/24/2019, 5:38 AM

## 2019-06-24 NOTE — Progress Notes (Signed)
OT Cancellation Note  Patient Details Name: Lawrence Wallace MRN: [id number] DOB: [birth year]   Cancelled Treatment:    Reason Eval/Treat Not Completed: Patient not medically ready. Pt remains intubated and sedated. Acute OT will sign off, please re-consult when medically appropriate.   Delight Stare, OT Acute Rehabilitation Services Pager [phone] Office [phone]   Delight Stare 06/24/2019, 10:59 AM

## 2019-06-24 NOTE — Progress Notes (Signed)
Nutrition Follow-up  DOCUMENTATION CODES:   Not applicable  INTERVENTION:   Once post-pyloric Cortrak tube placement confirmed, initiate tube feeds: -Start Vital 1.5 @ 20 ml/hr and titrate by 10 ml q 8 hours until goal rate of 50 ml/hr (1200 ml/day) - Pro-stat 30 ml TID - Free water per MD  Tube feeding regimen at goal provides 2100 kcal, 126 grams of protein, and 917 ml of H2O.   NUTRITION DIAGNOSIS:   Increased nutrient needs related to post-op healing as evidenced by estimated needs.  Ongoing, being addressed via TF  GOAL:   Patient will meet greater than or equal to 90% of their needs  Progressing  MONITOR:   Vent status, Weight trends, TF tolerance, Skin, I & O's  REASON FOR ASSESSMENT:   Consult Enteral/tube feeding initiation and management  ASSESSMENT:   63 year old male who presented on 9/30 for repair of abdominal aortic aneurysm and right common iliac artery aneurysm. Pt with significant intraoperative blood loss with subsequent hypotension requiring multiple blood products and pressors. PMH of CAD, skin cancer, GERD, HLD, HTN, NSTEMI.   10/3 - large emesis during transition to pressure support, NG tube placed to suction and TF d/c  Discussed pt with RN and during ICU rounds.  RD received consult for TF initiation and management. Post-pyloric Cortrak tube placed today by Cortrak team, abdominal x-ray confirmation pending at this time.  Free water flushes of 250 ml q 4 hours have been ordered by MD.  NG tube in place, currently to low intermittent suction.  EDW: 92.9 kg (BMI 26.28)  Patient remains intubated on ventilator support MV: 13.1 L/min Temp (24hrs), Avg:98.7 F (37.1 C), Min:97.5 F (36.4 C), Max:99.7 F (37.6 C) BP (cuff): 116/60 MAP (cuff): 75  Drips: Precedex: 20.8 ml/hr Fentanyl: 5 ml/hr Levophed: 2.8 ml/hr Lasix: 10 ml/hr 1/2NS: 50 ml/hr  Medications reviewed and include: Colace, Solu-Cortef, Protonix, KCl 40 mEq TID, IV  abx  Labs reviewed: sodium 152, potassium 3.3, chloride 114, BUN 69, creatinine 2.37, phosphorus 5.5, hemoglobin 8.4 CBG's: 74-113 x 24 hours  UOP: 2625 ml x 24 hours NG tube output: 700 ml x 24 hours I/O's: +10.8 L since admit  Diet Order:   Diet Order            Diet NPO time specified  Diet effective now              EDUCATION NEEDS:   No education needs have been identified at this time  Skin:  Skin Assessment: Skin Integrity Issues: Skin Integrity Issues: Incisions: closed abdomen  Last BM:  06/21/19  Height:   Ht Readings from Last 1 Encounters:  06/20/19 6\' 2"  (1.88 m)    Weight:   Wt Readings from Last 1 Encounters:  06/24/19 104.4 kg    Ideal Body Weight:  86.4 kg  BMI:  Body mass index is 29.55 kg/m.  Estimated Nutritional Needs:   Kcal:  2090  Protein:  120-140 grams  Fluid:  >/= 2.0 L    Gaynell Face, MS, RD, LDN Inpatient Clinical Dietitian Pager: [phone] Weekend/After Hours: [phone]

## 2019-06-24 NOTE — Procedures (Signed)
Cortrak  Person Inserting Tube:  Willey Blade E, RD Tube Type:  Cortrak - 43 inches Tube Location:  Right nare Initial Placement:  Postpyloric Secured by: Bridle Technique Used to Measure Tube Placement:  Documented cm marking at nare/ corner of mouth Cortrak Secured At:  95 cm    Cortrak Tube Team Note:  Consult received to place a Cortrak feeding tube.   X-ray is required, abdominal x-ray has been ordered by the Cortrak team. Please confirm tube placement before using the Cortrak tube.   If the tube becomes dislodged please keep the tube and contact the Cortrak team at [email or web address] ([email or web address]) for replacement.  If after hours and replacement cannot be delayed, place a NG tube and confirm placement with an abdominal x-ray.   Willey Blade, MS, [place], LDN Office: [phone] Pager: [phone] After Hours/Weekend Pager: [phone]

## 2019-06-24 NOTE — Progress Notes (Signed)
eLink Physician-Brief Progress Note Patient Name: Lawrence Wallace DOB: Feb 21, 1956 MRN: [id number]   Date of Service  06/24/2019  HPI/Events of Note  Hyperglycemia - Patient is on trickle enteral feeds. Blood glucose = 142.  eICU Interventions  Will order: 1. Q 4 hour sensitive Novolog SSI coverage.      Intervention Category Major Interventions: Hyperglycemia - active titration of insulin therapy  Felma Pfefferle [place] 06/24/2019, 10:04 PM

## 2019-06-25 ENCOUNTER — Inpatient Hospital Stay (HOSPITAL_COMMUNITY): Payer: Medicare Other

## 2019-06-25 LAB — POCT I-STAT 7, (LYTES, BLD GAS, ICA,H+H)
Acid-Base Excess: 2 mmol/L (ref 0.0–2.0)
Bicarbonate: 26 mmol/L (ref 20.0–28.0)
Calcium, Ion: 1.04 mmol/L — ABNORMAL LOW (ref 1.15–1.40)
HCT: 24 % — ABNORMAL LOW (ref 39.0–52.0)
Hemoglobin: 8.2 g/dL — ABNORMAL LOW (ref 13.0–17.0)
O2 Saturation: 94 %
Patient temperature: 98.6
Potassium: 3.2 mmol/L — ABNORMAL LOW (ref 3.5–5.1)
Sodium: 151 mmol/L — ABNORMAL HIGH (ref 135–145)
TCO2: 27 mmol/L (ref 22–32)
pCO2 arterial: 35.8 mmHg (ref 32.0–48.0)
pH, Arterial: 7.47 — ABNORMAL HIGH (ref 7.350–7.450)
pO2, Arterial: 66 mmHg — ABNORMAL LOW (ref 83.0–108.0)

## 2019-06-25 LAB — BASIC METABOLIC PANEL
Anion gap: 16 — ABNORMAL HIGH (ref 5–15)
BUN: 106 mg/dL — ABNORMAL HIGH (ref 8–23)
CO2: 25 mmol/L (ref 22–32)
Calcium: 7.3 mg/dL — ABNORMAL LOW (ref 8.9–10.3)
Chloride: 110 mmol/L (ref 98–111)
Creatinine, Ser: 3.01 mg/dL — ABNORMAL HIGH (ref 0.61–1.24)
GFR calc Af Amer: 24 mL/min — ABNORMAL LOW (ref >60)
GFR calc non Af Amer: 21 mL/min — ABNORMAL LOW (ref >60)
Glucose, Bld: 175 mg/dL — ABNORMAL HIGH (ref 70–99)
Potassium: 3.1 mmol/L — ABNORMAL LOW (ref 3.5–5.1)
Sodium: 151 mmol/L — ABNORMAL HIGH (ref 135–145)

## 2019-06-25 LAB — COMPREHENSIVE METABOLIC PANEL
ALT: 135 U/L — ABNORMAL HIGH (ref 0–44)
AST: 299 U/L — ABNORMAL HIGH (ref 15–41)
Albumin: 1.4 g/dL — ABNORMAL LOW (ref 3.5–5.0)
Alkaline Phosphatase: 63 U/L (ref 38–126)
Anion gap: 13 (ref 5–15)
BUN: 96 mg/dL — ABNORMAL HIGH (ref 8–23)
CO2: 25 mmol/L (ref 22–32)
Calcium: 7.1 mg/dL — ABNORMAL LOW (ref 8.9–10.3)
Chloride: 112 mmol/L — ABNORMAL HIGH (ref 98–111)
Creatinine, Ser: 2.86 mg/dL — ABNORMAL HIGH (ref 0.61–1.24)
GFR calc Af Amer: 26 mL/min — ABNORMAL LOW (ref >60)
GFR calc non Af Amer: 22 mL/min — ABNORMAL LOW (ref >60)
Glucose, Bld: 171 mg/dL — ABNORMAL HIGH (ref 70–99)
Potassium: 3.1 mmol/L — ABNORMAL LOW (ref 3.5–5.1)
Sodium: 150 mmol/L — ABNORMAL HIGH (ref 135–145)
Total Bilirubin: 2.9 mg/dL — ABNORMAL HIGH (ref 0.3–1.2)
Total Protein: 5.1 g/dL — ABNORMAL LOW (ref 6.5–8.1)

## 2019-06-25 LAB — CBC
HCT: 25.6 % — ABNORMAL LOW (ref 39.0–52.0)
Hemoglobin: 8.3 g/dL — ABNORMAL LOW (ref 13.0–17.0)
MCH: 30.9 pg (ref 26.0–34.0)
MCHC: 32.4 g/dL (ref 30.0–36.0)
MCV: 95.2 fL (ref 80.0–100.0)
Platelets: 143 10*3/uL — ABNORMAL LOW (ref 150–400)
RBC: 2.69 MIL/uL — ABNORMAL LOW (ref 4.22–5.81)
RDW: 15.2 % (ref 11.5–15.5)
WBC: 16.2 10*3/uL — ABNORMAL HIGH (ref 4.0–10.5)
nRBC: 0.2 % (ref 0.0–0.2)

## 2019-06-25 LAB — CULTURE, BLOOD (ROUTINE X 2)
Culture: NO GROWTH
Culture: NO GROWTH
Special Requests: ADEQUATE
Special Requests: ADEQUATE

## 2019-06-25 LAB — GLUCOSE, CAPILLARY
Glucose-Capillary: 160 mg/dL — ABNORMAL HIGH (ref 70–99)
Glucose-Capillary: 155 mg/dL — ABNORMAL HIGH (ref 70–99)
Glucose-Capillary: 156 mg/dL — ABNORMAL HIGH (ref 70–99)
Glucose-Capillary: 170 mg/dL — ABNORMAL HIGH (ref 70–99)
Glucose-Capillary: 161 mg/dL — ABNORMAL HIGH (ref 70–99)
Glucose-Capillary: 143 mg/dL — ABNORMAL HIGH (ref 70–99)

## 2019-06-25 LAB — PROCALCITONIN: Procalcitonin: 7.23 ng/mL

## 2019-06-25 LAB — PHOSPHORUS: Phosphorus: 5.6 mg/dL — ABNORMAL HIGH (ref 2.5–4.6)

## 2019-06-25 LAB — POTASSIUM: Potassium: 3 mmol/L — ABNORMAL LOW (ref 3.5–5.1)

## 2019-06-25 LAB — MAGNESIUM: Magnesium: 2.4 mg/dL (ref 1.7–2.4)

## 2019-06-25 MED ORDER — POTASSIUM CHLORIDE 20 MEQ/15ML (10%) PO SOLN
40.0000 meq | Freq: Three times a day (TID) | ORAL | Status: AC
  Administered 2019-06-25 (×2): 40 meq
  Filled 2019-06-25 (×2): qty 30

## 2019-06-25 MED ORDER — POTASSIUM CHLORIDE 10 MEQ/50ML IV SOLN
10.0000 meq | INTRAVENOUS | Status: AC
  Administered 2019-06-25 – 2019-06-26 (×4): 10 meq via INTRAVENOUS
  Filled 2019-06-25 (×4): qty 50

## 2019-06-25 MED ORDER — METOLAZONE 5 MG PO TABS
10.0000 mg | ORAL_TABLET | Freq: Once | ORAL | Status: AC
  Administered 2019-06-25: 13:00:00 10 mg via ORAL
  Filled 2019-06-25: qty 2

## 2019-06-25 MED ORDER — POTASSIUM CHLORIDE 10 MEQ/50ML IV SOLN
10.0000 meq | INTRAVENOUS | Status: AC
  Administered 2019-06-25 (×4): 10 meq via INTRAVENOUS
  Filled 2019-06-25 (×4): qty 50

## 2019-06-25 MED ORDER — FUROSEMIDE 10 MG/ML IJ SOLN
40.0000 mg | Freq: Four times a day (QID) | INTRAMUSCULAR | Status: DC
  Administered 2019-06-25: 40 mg via INTRAVENOUS
  Filled 2019-06-25: qty 4

## 2019-06-25 NOTE — Plan of Care (Signed)
Spoke with pulmonary.  Excellent response to diuresis and now down to FI02 40/PEEP 5 per their last adjustments.  Pause diuresis for now (discontinued scheduled lasix - ok with pulm).   Harrie Jeans, MD

## 2019-06-25 NOTE — Progress Notes (Signed)
[facility]  Subjective  -remains intubated.  Weaned on the vent overnight.  FIO2 50% from 100% yesterday.  Lasix drip with rising Cr.  Appreciate nephrology input.   Objective (!) 98/55 86 98.1 F (36.7 C) (Oral) (!) 21 95%  Intake/Output Summary (Last 24 hours) at 06/25/2019 1231 Last data filed at 06/25/2019 1200 Gross per 24 hour  Intake 3059.6 ml  Output 4685 ml  Net -1625.4 ml   Intubated, sedated, GCS 3 T - nursing reports following commands when sedation weaned Midline incision c/d/i Femoral pulses are palpable bilaterally Brisk doppler posterior tibial and dorsalis pedis signals bilaterally Left radial occluded distally, left ulnar artery triphasic, duskiness of left middle finger improved  Laboratory Lab Results: Recent Labs    06/24/19 0428 06/25/19 0350 06/25/19 0352  WBC 15.1*  --  16.2*  HGB 8.4* 8.2* 8.3*  HCT 25.7* 24.0* 25.6*  PLT 119*  --  143*   BMET Recent Labs    06/24/19 1758 06/25/19 0350 06/25/19 0352  NA 152* 151* 150*  K 3.4* 3.2* 3.1*  CL 116*  --  112*  CO2 24  --  25  GLUCOSE 142*  --  171*  BUN 83*  --  96*  CREATININE 2.65*  --  2.86*  CALCIUM 6.8*  --  7.1*    COAG Lab Results  Component Value Date   INR 1.5 (H) 05/26/2019   INR 1.0 06/16/2019   No results found for: PTT  Assessment/Planning:  Postop day 7 status post open aortic aneurysm repair as well as right common iliac aneurysm repair with bifurcated dacron graft.  Remains intubated in the ICU critically ill.  Neuro: GCS 3 T this morning and intubated and sedated.  Encephalopathy. Very agitated when sedation weaned.  On precedex and low dose fentanyl.  Nursing now reports he is following commands when sedation weaned Cardiovascular: Vasopressin weaned off, Levophed down to 1 mcg.  Hgb 9.6 --> 9.9 --> 10 --> 8.7 --> 8.8. --> 8.4 --> 8.3.  Platelets improving.  HIT negative.   Pulmonary: Remains intubated.  Vent on Peep 10 and FiO2  weaned from 100% to 50% today, lasix gtt for diuresis, needs improvement of pulmonary funtion, e. Coli pneumonia, diuresis limited by rising creatinine.  Appreciate nephrology input GI: CT abd/pelvis yesterday Saturday, no signs of thickened bowel, pnematosis, or free air over weekend.  No plans for abdominal exploration.  Had normal BM Saturday.   Transpyloric tube placed yesterday.  Trickle feeds started, he seems to be tolerating. Renal: Diuresis yesterday with lasix drip.  Cr 2.22 --> 2.07 --> 2.15 --> 1.92 --> 1.83 --> 2.37 --> 2.65 --> 2.86 after diuresis.  Lasix drip stopped.  Nephrology following Liver: Shock liver, enzymes improved FEN: pH improved 7.4. ID: BC no growth to date, tracheal aspirate e coli.  on zosyn, suspect pneumonia, e. Coli pneumonia.  Will follow cultures. Continue lovenox daily for DVT prophylaxis.      Appreciate critical care support.  Weaned to 1 mcg of Levophed.  Limited by high vent requirements yesterday and getting diuresis to wean vent but rising creatinine.  Nephrology involved now.  He has had excellent response to diuresis yesterday with 4L UOP.    [name], MD [facility] [place] Office: [phone] Pager: [phone]   [name] 06/25/2019 12:31 PM --

## 2019-06-25 NOTE — Progress Notes (Addendum)
NAME:  Lawrence Wallace, MRN:  [id number], DOB:  17-Nov-1955, LOS: 7 ADMISSION DATE:  06/07/2019, CONSULTATION DATE:  05/22/2019 REFERRING MD:  Carlis Abbott  CHIEF COMPLAINT:  Vent Management   Brief History   Lawrence Wallace is a 63 y.o. male who was admitted 9/30 for repair of large AAA.  He had significant intraoperative blood loss with subsequent hypotension requiring 5L IVF's, 2u PRBC, 2 FFP, 2.5L from cellsaver, and neosynephrine.  Upon leaving PACU, he was somewhat hypertensive and was started on cleviprex.  History of present illness   Pt is encephelopathic; therefore, this HPI is obtained from chart review. Lawrence Wallace is a 63 y.o. male who has a PMH including but not limited to tobacco, abuse, AAA, NSTEMI, HTN, HLD, CAD, skin CA, GERD.  He presented to [facility] 9/30 for repair of 7cm AAA and 5cm R common iliac artery aneurysm.  He had significant blood loss intraoperatively and required 5L IVF's, 2u PRBC, 2 FFP, 2.5L cell saver, and neosynephrine.  Just prior to leaving PACU, he was noted to be hypertensive and was started on cleviprex.  Neosynephrine was discontinued upon arrival to ICU and cleviprex is being titrated down.  Past Medical History  tobacco, abuse, AAA, NSTEMI, HTN, HLD, CAD, skin CA, GERD.  Significant Hospital Events   9/30 > admitted for OR repair of 7cm AAA and 5cm R common iliac artery aneurysm. 10/1-10/2 > Difficult to wean off sedation or vent due to agitation. Started on seroquel 10/3 > Tolerating PS   Consults:  PCCM.  Procedures:  ETT 9/30 >  L rad art line 9/30 >  R swan 9/30 >   Significant Diagnostic Tests:  TTE 06/19/19 - EF 60-65% No WMA or valvular abnormalities  Micro Data:  Blood 10/3>>>NTD Urine 10/3>>>NTD Sputum 10/3>>>E coli resistant to amp and unasyn, sensitive to zosyn  Antimicrobials:  Vanc 10/3>>>10/5 Zosyn 10/3>>>  Interim history/subjective:  Negative I&O and response to Lasix.  Currently sedated on full mechanical ventilatory support.  FiO2 is  weaning  Objective:  Blood pressure (!) 96/53, pulse 79, temperature 99 F (37.2 C), temperature source Oral, resp. rate 20, height 6\' 2"  (1.88 m), weight 103.3 kg, SpO2 95 %. CVP:  [4 mmHg-9 mmHg] 6 mmHg  Vent Mode: PRVC FiO2 (%):  [40 %-80 %] 40 % Set Rate:  [20 bmp] 20 bmp Vt Set:  [650 mL] 650 mL PEEP:  [10 cmH20] 10 cmH20 Plateau Pressure:  [13 cmH20-24 cmH20] 22 cmH20   Intake/Output Summary (Last 24 hours) at 06/25/2019 0910 Last data filed at 06/25/2019 0800 Gross per 24 hour  Intake 2840.82 ml  Output 4430 ml  Net -1589.18 ml   Filed Weights   06/23/19 0600 06/24/19 0413 06/25/19 0328  Weight: 104.5 kg 104.4 kg 103.3 kg   Physical Exam: General: Appears older than stated age of 34 HEENT: Pupils equal react to light.  Appears to track. Neuro: Does not follow commands. CV: Heart sounds are regular PULM: Coarse rhonchi bilateral decreased breath sounds in the bases GI: Abdomen is distended faint bowel sounds Extremities: warm/dry, 2+ Skin: Lower extremities are noted with vascular insufficiency   06/25/2019 support apparatus in proper position, left basilar atelectasis      Gastric tube and core tract noted. Assessment & Plan:  63 year old male with AAA and bilateral common iliac aneurysms s/p elective open AAA repair with intra-op EBL 6600 ml. He was resuscitated with 5.2L LR, 2.5L cellsaver, 2U PRBC and 2U FFP.  Hemorrhagic Shock - resolved Septic shock Off pressors at present HGB 8.4 on 10/6 WBC up trending 10/6, Sputum + for E coli Continue to wean pressors as tolerated Transfuse as needed Continue Zosyn Currently on stress dose steroids low threshold to begin weaning   Agitation requiring sedation Continue fentanyl Precedex Continue Seroquel   Acute respiratory failure + 10.8 L   Intake/Output Summary (Last 24 hours) at 06/25/2019 [id number] Last data filed at 06/25/2019 0900 Gross per 24 hour  Intake 3077.98 ml  Output 4655 ml  Net -1577.02 ml    Wean as tolerated Expectation as he is diuresed decreasing volume overloaded to be more amenable to weaning FiO2 is now down to 50% PEEP continue to wean Monitor chest x-rays     AKI, oliguric. Likely ATN in setting of shock Creatinine and BUN up trending 10/6 Hypokalemia Hypernatremia Hyperphos Lab Results  Component Value Date   CREATININE 2.86 (H) 06/25/2019   CREATININE 2.65 (H) 06/24/2019   CREATININE 2.37 (H) 06/24/2019   Recent Labs  Lab 06/24/19 1758 06/25/19 0350 06/25/19 0352  K 3.4* 3.2* 3.1*   Recent Labs  Lab 06/24/19 1758 06/25/19 0350 06/25/19 0352  NA 152* 151* 150*   Continue Lasix drip Replete potassium as needed Patient nephrology's input Still may need CRRT in the future Continue free water and D5W with sodium study     Incidental AAA s/p open repair  7.4cm infrarenal aneurysm, EBL 6 units with 2.5 units returned via cell-saver   Was hypotensive peri-operatively and started on Neo, but then hypertensive post-op and initiated on Cleviprex Per vascular vein specialist  Transaminitis Continue to monitor Some expectation of rise with tube feedings  Acute blood loss anemia Post-op thrombocytopenia>> resolving  Recent Labs    06/25/19 0350 06/25/19 0352  HGB 8.2* 8.3*    Monitor and replete per protocol   Hx HTN, CAD, NSTEMI Continue holding home Lopressor Plavix aspirin and statin in the setting of shock  Hypomag Monitor replete as needed   Discussion Started on Lasix drip 06/24/2019 with one-time dose of Zaroxolyn.  -1300 cc 24 hours.  Continue current interventions appreciate nephrology's input.  Intake/Output Summary (Last 24 hours) at 06/25/2019 0913 Last data filed at 06/25/2019 0900 Gross per 24 hour  Intake 3077.98 ml  Output 4430 ml  Net -1352.02 ml    Labs   CBC: Recent Labs  Lab 06/19/19 0806  06/21/19 1153 06/22/19 0311 06/23/19 0339 06/23/19 0351 06/24/19 0330 06/24/19 0428 06/25/19 0350 06/25/19  0352  WBC 14.9*   < > 12.5* 11.1* 10.0  --   --  15.1*  --  16.2*  NEUTROABS 11.6*  --   --  9.0*  --   --   --   --   --   --   HGB 11.9*   < > 10.0* 8.7* 8.8* 9.9* 7.8* 8.4* 8.2* 8.3*  HCT 36.1*   < > 29.5* 27.5* 25.8* 29.0* 23.0* 25.7* 24.0* 25.6*  MCV 93.0   < > 93.4 96.5 93.1  --   --  95.5  --  95.2  PLT 55*   < > 96* 68* 75*  --   --  119*  --  143*   < > = values in this interval not displayed.   Basic Metabolic Panel: Recent Labs  Lab 06/21/19 0315  06/21/19 1645 06/22/19 0311 06/23/19 0339  06/24/19 0330 06/24/19 0428 06/24/19 1758 06/25/19 0350 06/25/19 0352  NA 142   < >  --  144 149*   < > 151* 152* 152* 151* 150*  K 3.7   < >  --  3.6 3.3*   < > 3.3* 3.3* 3.4* 3.2* 3.1*  CL 111   < >  --  110 114*  --   --  114* 116*  --  112*  CO2 21*   < >  --  24 25  --   --  24 24  --  25  GLUCOSE 156*   < >  --  109* 105*  --   --  114* 142*  --  171*  BUN 53*   < >  --  52* 50*  --   --  69* 83*  --  96*  CREATININE 2.15*   < >  --  1.92* 1.83*  --   --  2.37* 2.65*  --  2.86*  CALCIUM 6.5*   < >  --  6.8* 7.0*  --   --  7.0* 6.8*  --  7.1*  MG 1.5*   < > 1.9 2.0 2.2  --   --  2.4  --   --  2.4  PHOS 3.8  --  4.4  --  3.8  --   --  5.5*  --   --  5.6*   < > = values in this interval not displayed.   GFR: Estimated Creatinine Clearance: 33 mL/min (A) (by C-G formula based on SCr of 2.86 mg/dL (H)). Recent Labs  Lab 06/19/19 0807  06/21/19 1423 06/21/19 1908 06/21/19 2227 06/22/19 0311 06/22/19 0938 06/23/19 0339 06/24/19 0428 06/25/19 0352  PROCALCITON  --   --   --   --   --   --  7.57 7.14 8.04 7.23  WBC  --    < >  --   --   --  11.1*  --  10.0 15.1* 16.2*  LATICACIDVEN 3.2*  --  2.7* 2.4* 2.0*  --   --   --   --   --    < > = values in this interval not displayed.   Liver Function Tests: Recent Labs  Lab 06/19/19 0806 06/20/19 0326 06/21/19 0315 06/22/19 0311 06/25/19 0352  AST 1,432* 1,150* 412* 160* 299*  ALT 511* 446* 268* 154* 135*  ALKPHOS 35*  37* 43 53 63  BILITOT 2.4* 3.0* 2.8* 4.0* 2.9*  PROT 4.4* 4.4* 4.4* 4.4* 5.1*  ALBUMIN 3.1* 2.5* 1.8* 1.8* 1.4*   Recent Labs  Lab 06/19/19 0000 06/22/19 0311  LIPASE  --  57*  AMYLASE 485*  --    No results for input(s): AMMONIA in the last 168 hours. ABG    Component Value Date/Time   PHART 7.470 (H) 06/25/2019 0350   PCO2ART 35.8 06/25/2019 0350   PO2ART 66.0 (L) 06/25/2019 0350   HCO3 26.0 06/25/2019 0350   TCO2 27 06/25/2019 0350   ACIDBASEDEF 4.0 (H) 06/21/2019 0346   O2SAT 94.0 06/25/2019 0350    Coagulation Profile: Recent Labs  Lab 05/24/2019 1651  INR 1.5*   Cardiac Enzymes: No results for input(s): CKTOTAL, CKMB, CKMBINDEX, TROPONINI in the last 168 hours. HbA1C: Hgb A1c MFr Bld  Date/Time Value Ref Range Status  06/24/2019 10:04 PM 5.7 (H) 4.8 - 5.6 % Final    Comment:    (NOTE) Pre diabetes:          5.7%-6.4% Diabetes:              >6.4% Glycemic control  for   <7.0% adults with diabetes   12/10/2017 09:20 AM 5.9 4.6 - 6.5 % Final    Comment:    Glycemic Control Guidelines for People with Diabetes:Non Diabetic:  <6%Goal of Therapy: <7%Additional Action Suggested:  >8%    CBG: Recent Labs  Lab 06/24/19 1616 06/24/19 1953 06/25/19 0025 06/25/19 0344 06/25/19 0803  GLUCAP 116* 141* 160* 155* 156*   CC APP time 30 minutes  Richardson Landry Minor ACNP Maryanna Shape PCCM Pager [phone] till 1 pm If no answer page [phone] 06/25/2019, 9:10 AM  Attending Note:  63 year old male s/p AAA repair and respiratory failure with fluid overload and circulatory shock.  Overnight, diuresed well but Cr increased.  On exam, diffuse crackles continue but pressor demand is decreasing.  I reviewed CXR myself, ETT is in a good position and pulmonary edema noted.  Discussed with PCCM-NP.  Will d/c lasix drip.  Change to lasix pushes.  Begin PS trials.  Decrease to 40% and PEEP of 5.  D/C sedation and evaluate mental status.  Anticipate extubation in AM pending mental status.   PCCM will continue to follow.  The patient is critically ill with multiple organ systems failure and requires high complexity decision making for assessment and support, frequent evaluation and titration of therapies, application of advanced monitoring technologies and extensive interpretation of multiple databases.   Critical Care Time devoted to patient care services described in this note is  33  Minutes. This time reflects time of care of this signee Dr [name]. This critical care time does not reflect procedure time, or teaching time or supervisory time of PA/NP/Med student/Med Resident etc but could involve care discussion time.  Rush Farmer, M.D. [facility] Pulmonary/Critical Care Medicine. Pager: [phone]. After hours pager: [phone].

## 2019-06-25 NOTE — Plan of Care (Signed)
  Problem: Clinical Measurements: Goal: Ability to maintain clinical measurements within normal limits will improve Outcome: Progressing Goal: Will remain free from infection Outcome: Progressing Goal: Diagnostic test results will improve Outcome: Progressing Goal: Respiratory complications will improve Outcome: Progressing Goal: Cardiovascular complication will be avoided Outcome: Progressing   Problem: Education: Goal: Knowledge of General Education information will improve Description: Including pain rating scale, medication(s)/side effects and non-pharmacologic comfort measures Outcome: Not Progressing   Problem: Health Behavior/Discharge Planning: Goal: Ability to manage health-related needs will improve Outcome: Not Progressing   Problem: Activity: Goal: Risk for activity intolerance will decrease Outcome: Not Progressing

## 2019-06-25 NOTE — Progress Notes (Signed)
eLink Physician-Brief Progress Note Patient Name: Lawrence Wallace DOB: 12/17/1955 MRN: [id number]   Date of Service  06/25/2019  HPI/Events of Note  K+ = 3.1 and Creatinine = 2.86. Patient is on Laxis IV infusion at 10 mg/hour.   eICU Interventions  Will order: 1. Replace K+. 2. Repeat BMP at 12 noon.      Intervention Category Major Interventions: Electrolyte abnormality - evaluation and management  Sommer,Steven Eugene 06/25/2019, 5:45 AM

## 2019-06-25 NOTE — Progress Notes (Signed)
RT note- sedation has been decreased fwith a increase of work of breathing. fio2 and peep requirements needed, placed back to 10 PEEP and fio2 60%, continue to monitor and wean as tolerated.

## 2019-06-25 NOTE — Progress Notes (Signed)
[facility] Progress Note  Name: Lawrence Wallace MRN: [id number] DOB: 1955-11-29  Chief Complaint:  AAA repair   Subjective:  He had 3.7 liters UOP over 10/6 on the lasix and metolazone.  Note additional 40 meq potassium ordered overnight.  Levo down to 2 mcg/min.  FIO2 down to 50% with diuresis.   Review of systems:  Unable to obtain 2/2 mechanical ventilation  ----------- Background on consult:  HPI: Lawrence Wallace is a 63 y.o. male with PMH of CAD, NSTEMI S/P PTCI to mid circ 12/2018, HTN, HLD, tobacco abuse. Patient was admitted by Dr. Carlis Abbott 06/05/2019 for repair of 7 cm abdominal aortic aneurysm and right common iliac aneurysm repair. Post Op course complicated by EBL 3845 with hypovolemic shock requiring pressors and transfusion. Course further complicated by acute hypoxemic respiratory failure and sepsis-tracheal  aspirate positive for E. Coli-he was started on Vancomycin and Zosyn which has now been narrowed to zosyn. Critical care following for vent management.   SCr prior to admission was 0.72-0.8 with EGFR >60. Since admission, SCr has progressively climbed to 2.37 BUN 69 EGFR 28 NA 152 Ca 7.0 C Ca 8.76 Phos 5.5 today. Urine output progressively declined, he is currently positive 10,476 cc. He has been started on furosemide drip at 10 mg/hr with one time dose of metolazone 10 mg PO today. Urine output has increased but remains overtly volume overloaded. Pressors have been weaned to low dose levophed at 4 mcg/min. He is not acidotic. Last HGB 8.4. He is sedated on full ventilator support.   Intake/Output Summary (Last 24 hours) at 06/25/2019 0547 Last data filed at 06/25/2019 0500 Gross per 24 hour  Intake 2702.65 ml  Output 4865 ml  Net -2162.35 ml    Vitals:  Vitals:   06/25/19 0316 06/25/19 0328 06/25/19 0400 06/25/19 0500  BP:   (!) 101/57 (!) 101/58  Pulse:   76 76  Resp:   20 (!) 23  Temp:   97.6 F (36.4 C)   TempSrc:   Oral   SpO2: 96%  95% 95%  Weight:   103.3 kg    Height:         Physical Exam: General adult male intubated critically ill  HEENT normocephalic atraumatic  Lungs coarse mechanical breath sounds  Heart RRR; no rub Abdomen soft nontender nondistended Extremities 2+ edema bilaterally upper and lower  Psych no agitation  Neuro - continuous sedation   Medications reviewed   Labs:  BMP Latest Ref Rng & Units 06/25/2019 06/25/2019 06/24/2019  Glucose 70 - 99 mg/dL 171(H) - 142(H)  BUN 8 - 23 mg/dL 96(H) - 83(H)  Creatinine 0.61 - 1.24 mg/dL 2.86(H) - 2.65(H)  Sodium 135 - 145 mmol/L 150(H) 151(H) 152(H)  Potassium 3.5 - 5.1 mmol/L 3.1(L) 3.2(L) 3.4(L)  Chloride 98 - 111 mmol/L 112(H) - 116(H)  CO2 22 - 32 mmol/L 25 - 24  Calcium 8.9 - 10.3 mg/dL 7.1(L) - 6.8(L)     Assessment/Plan:   # AKI  - multifactorial prerenal and ischemic insults.  Note concurrent shock liver.  Now with worsening with diuresis to optimize volume status.  CT with normal kidneys and no hydro a few days ago.  Urinalysis negative for protein or red blood cellsHis wife would want him to have dialysis if needed.   - Excellent urine output on lasix gtt - trial for 24 hours and reassessing - note BMP ordered for noon - Monitoring for the need for renal replacement therapy    #  Shock-multifactorial.  History of hemorrhagic shock and septic shock.   - On low-dose levo  # Acute hypoxic respiratory failure on mechanical ventilation.  Optimizing volume status  # Acute blood loss anemia - no acute indication for PRBC's    # Hypokalemia - repleting.  Losses with lasix gtt.  BMP ordered at noon    # Hypernatremia he is on D5W and enteric free water 250 mL every 4 hours   # E. coli pneumonia antibiotics per primary team.  Would dose Zosyn for renal function   # AAA status post repair per vascular surgery  # Hypoalbuminemia nutrition per primary team  Claudia Desanctis, MD 06/25/2019 5:47 AM

## 2019-06-26 ENCOUNTER — Inpatient Hospital Stay (HOSPITAL_COMMUNITY): Payer: Medicare Other

## 2019-06-26 ENCOUNTER — Other Ambulatory Visit: Payer: Self-pay

## 2019-06-26 LAB — BLOOD GAS, ARTERIAL
Acid-base deficit: 0.2 mmol/L (ref 0.0–2.0)
Acid-base deficit: 0.2 mmol/L (ref 0.0–2.0)
Bicarbonate: 24.2 mmol/L (ref 20.0–28.0)
Bicarbonate: 24.3 mmol/L (ref 20.0–28.0)
Drawn by: 347621
Drawn by: 51185
FIO2: 60
FIO2: 100
MECHVT: 650 mL
MECHVT: 650 mL
O2 Saturation: 90.5 %
O2 Saturation: 97.9 %
PEEP: 8 cmH2O
PEEP: 8 cmH2O
Patient temperature: 98.7
Patient temperature: 98.6
RATE: 20 resp/min
RATE: 20 resp/min
pCO2 arterial: 41.3 mmHg (ref 32.0–48.0)
pCO2 arterial: 42.4 mmHg (ref 32.0–48.0)
pH, Arterial: 7.386 (ref 7.350–7.450)
pH, Arterial: 7.376 (ref 7.350–7.450)
pO2, Arterial: 66.6 mmHg — ABNORMAL LOW (ref 83.0–108.0)
pO2, Arterial: 118 mmHg — ABNORMAL HIGH (ref 83.0–108.0)

## 2019-06-26 LAB — BASIC METABOLIC PANEL
Anion gap: 17 — ABNORMAL HIGH (ref 5–15)
BUN: 114 mg/dL — ABNORMAL HIGH (ref 8–23)
CO2: 23 mmol/L (ref 22–32)
Calcium: 6.9 mg/dL — ABNORMAL LOW (ref 8.9–10.3)
Chloride: 109 mmol/L (ref 98–111)
Creatinine, Ser: 3.06 mg/dL — ABNORMAL HIGH (ref 0.61–1.24)
GFR calc Af Amer: 24 mL/min — ABNORMAL LOW (ref >60)
GFR calc non Af Amer: 20 mL/min — ABNORMAL LOW (ref >60)
Glucose, Bld: 179 mg/dL — ABNORMAL HIGH (ref 70–99)
Potassium: 3.3 mmol/L — ABNORMAL LOW (ref 3.5–5.1)
Sodium: 149 mmol/L — ABNORMAL HIGH (ref 135–145)

## 2019-06-26 LAB — CBC WITH DIFFERENTIAL/PLATELET
Abs Immature Granulocytes: 0.37 10*3/uL — ABNORMAL HIGH (ref 0.00–0.07)
Basophils Absolute: 0.1 10*3/uL (ref 0.0–0.1)
Basophils Relative: 0 %
Eosinophils Absolute: 0 10*3/uL (ref 0.0–0.5)
Eosinophils Relative: 0 %
HCT: 27.7 % — ABNORMAL LOW (ref 39.0–52.0)
Hemoglobin: 9 g/dL — ABNORMAL LOW (ref 13.0–17.0)
Immature Granulocytes: 1 %
Lymphocytes Relative: 3 %
Lymphs Abs: 0.9 10*3/uL (ref 0.7–4.0)
MCH: 30.6 pg (ref 26.0–34.0)
MCHC: 32.5 g/dL (ref 30.0–36.0)
MCV: 94.2 fL (ref 80.0–100.0)
Monocytes Absolute: 0.8 10*3/uL (ref 0.1–1.0)
Monocytes Relative: 3 %
Neutro Abs: 26.3 10*3/uL — ABNORMAL HIGH (ref 1.7–7.7)
Neutrophils Relative %: 93 %
Platelets: 192 10*3/uL (ref 150–400)
RBC: 2.94 MIL/uL — ABNORMAL LOW (ref 4.22–5.81)
RDW: 15.4 % (ref 11.5–15.5)
WBC: 28.5 10*3/uL — ABNORMAL HIGH (ref 4.0–10.5)
nRBC: 0.2 % (ref 0.0–0.2)

## 2019-06-26 LAB — RENAL FUNCTION PANEL
Albumin: 1.3 g/dL — ABNORMAL LOW (ref 3.5–5.0)
Anion gap: 11 (ref 5–15)
BUN: 94 mg/dL — ABNORMAL HIGH (ref 8–23)
CO2: 25 mmol/L (ref 22–32)
Calcium: 6.5 mg/dL — ABNORMAL LOW (ref 8.9–10.3)
Chloride: 110 mmol/L (ref 98–111)
Creatinine, Ser: 2.16 mg/dL — ABNORMAL HIGH (ref 0.61–1.24)
GFR calc Af Amer: 36 mL/min — ABNORMAL LOW (ref >60)
GFR calc non Af Amer: 31 mL/min — ABNORMAL LOW (ref >60)
Glucose, Bld: 173 mg/dL — ABNORMAL HIGH (ref 70–99)
Phosphorus: 4.3 mg/dL (ref 2.5–4.6)
Potassium: 3.1 mmol/L — ABNORMAL LOW (ref 3.5–5.1)
Sodium: 146 mmol/L — ABNORMAL HIGH (ref 135–145)

## 2019-06-26 LAB — GLUCOSE, CAPILLARY
Glucose-Capillary: 135 mg/dL — ABNORMAL HIGH (ref 70–99)
Glucose-Capillary: 145 mg/dL — ABNORMAL HIGH (ref 70–99)
Glucose-Capillary: 149 mg/dL — ABNORMAL HIGH (ref 70–99)
Glucose-Capillary: 152 mg/dL — ABNORMAL HIGH (ref 70–99)
Glucose-Capillary: 171 mg/dL — ABNORMAL HIGH (ref 70–99)
Glucose-Capillary: 175 mg/dL — ABNORMAL HIGH (ref 70–99)
Glucose-Capillary: 182 mg/dL — ABNORMAL HIGH (ref 70–99)

## 2019-06-26 LAB — MAGNESIUM: Magnesium: 2.2 mg/dL (ref 1.7–2.4)

## 2019-06-26 LAB — TROPONIN I (HIGH SENSITIVITY)
Troponin I (High Sensitivity): 32 ng/L — ABNORMAL HIGH (ref <18)
Troponin I (High Sensitivity): 29 ng/L — ABNORMAL HIGH (ref <18)

## 2019-06-26 LAB — PROCALCITONIN: Procalcitonin: 5.51 ng/mL

## 2019-06-26 LAB — PHOSPHORUS: Phosphorus: 5.1 mg/dL — ABNORMAL HIGH (ref 2.5–4.6)

## 2019-06-26 MED ORDER — POTASSIUM CHLORIDE 10 MEQ/100ML IV SOLN
10.0000 meq | INTRAVENOUS | Status: AC
  Administered 2019-06-26 (×4): 10 meq via INTRAVENOUS
  Filled 2019-06-26 (×4): qty 100

## 2019-06-26 MED ORDER — CALCIUM GLUCONATE-NACL 1-0.675 GM/50ML-% IV SOLN: 1.0000 g | Freq: Once | INTRAVENOUS | Status: DC

## 2019-06-26 MED ORDER — VANCOMYCIN HCL 10 G IV SOLR
2000.0000 mg | Freq: Once | INTRAVENOUS | Status: AC
  Administered 2019-06-26: 2000 mg via INTRAVENOUS
  Filled 2019-06-26: qty 2000

## 2019-06-26 MED ORDER — PRISMASOL BGK 4/2.5 32-4-2.5 MEQ/L IV SOLN
INTRAVENOUS | Status: DC
  Administered 2019-06-26 – 2019-06-27 (×7): via INTRAVENOUS_CENTRAL

## 2019-06-26 MED ORDER — PIPERACILLIN-TAZOBACTAM 3.375 G IVPB 30 MIN
3.3750 g | Freq: Four times a day (QID) | INTRAVENOUS | Status: DC
  Administered 2019-06-26 – 2019-06-27 (×4): 3.375 g via INTRAVENOUS
  Filled 2019-06-26 (×11): qty 50

## 2019-06-26 MED ORDER — ETOMIDATE 2 MG/ML IV SOLN
INTRAVENOUS | Status: AC
  Administered 2019-06-26: 20 mg via INTRAVENOUS
  Filled 2019-06-26: qty 10

## 2019-06-26 MED ORDER — VECURONIUM BROMIDE 10 MG IV SOLR
INTRAVENOUS | Status: AC
  Administered 2019-06-26: 10 mg via INTRAVENOUS
  Filled 2019-06-26: qty 10

## 2019-06-26 MED ORDER — PRISMASOL BGK 4/2.5 32-4-2.5 MEQ/L REPLACEMENT SOLN
Status: DC
  Administered 2019-06-26 – 2019-06-27 (×2): via INTRAVENOUS_CENTRAL

## 2019-06-26 MED ORDER — MIDAZOLAM HCL 2 MG/2ML IJ SOLN
1.0000 mg | INTRAMUSCULAR | Status: DC | PRN
  Administered 2019-06-26 – 2019-06-27 (×5): 2 mg via INTRAVENOUS
  Filled 2019-06-26 (×5): qty 2

## 2019-06-26 MED ORDER — VECURONIUM BROMIDE 10 MG IV SOLR
10.0000 mg | Freq: Once | INTRAVENOUS | Status: AC
  Administered 2019-06-26: 10:00:00 10 mg via INTRAVENOUS

## 2019-06-26 MED ORDER — HEPARIN BOLUS VIA INFUSION (CRRT)
1000.0000 [IU] | INTRAVENOUS | Status: DC | PRN
  Administered 2019-06-26 (×2): 1000 [IU] via INTRAVENOUS_CENTRAL
  Filled 2019-06-26: qty 1000

## 2019-06-26 MED ORDER — METOPROLOL TARTRATE 25 MG/10 ML ORAL SUSPENSION
25.0000 mg | Freq: Two times a day (BID) | ORAL | Status: DC
  Administered 2019-06-26: 25 mg via ORAL
  Filled 2019-06-26: qty 10

## 2019-06-26 MED ORDER — PRISMASOL BGK 4/2.5 32-4-2.5 MEQ/L REPLACEMENT SOLN
Status: DC
  Administered 2019-06-26 (×2): via INTRAVENOUS_CENTRAL

## 2019-06-26 MED ORDER — ETOMIDATE 2 MG/ML IV SOLN
20.0000 mg | Freq: Once | INTRAVENOUS | Status: AC
  Administered 2019-06-26: 10:00:00 20 mg via INTRAVENOUS

## 2019-06-26 MED ORDER — FREE WATER
150.0000 mL | Freq: Three times a day (TID) | Status: DC
  Administered 2019-06-26 – 2019-06-27 (×3): 150 mL

## 2019-06-26 MED ORDER — POTASSIUM CHLORIDE 10 MEQ/100ML IV SOLN
10.0000 meq | INTRAVENOUS | Status: AC
  Administered 2019-06-26 (×4): 10 meq via INTRAVENOUS
  Filled 2019-06-26 (×6): qty 100

## 2019-06-26 MED ORDER — HEPARIN SODIUM (PORCINE) 1000 UNIT/ML DIALYSIS
1000.0000 [IU] | INTRAMUSCULAR | Status: DC | PRN
  Administered 2019-06-26: 2000 [IU] via INTRAVENOUS_CENTRAL
  Filled 2019-06-26 (×3): qty 6

## 2019-06-26 MED ORDER — PHENYLEPHRINE HCL-NACL 10-0.9 MG/250ML-% IV SOLN
0.0000 ug/min | INTRAVENOUS | Status: DC
  Administered 2019-06-26: 60 ug/min via INTRAVENOUS
  Administered 2019-06-26: 70 ug/min via INTRAVENOUS
  Administered 2019-06-26: 13.333 ug/min via INTRAVENOUS
  Administered 2019-06-26: 20 ug/min via INTRAVENOUS
  Administered 2019-06-27: 90 ug/min via INTRAVENOUS
  Administered 2019-06-27: 100 ug/min via INTRAVENOUS
  Administered 2019-06-27 (×2): 90 ug/min via INTRAVENOUS
  Filled 2019-06-26 (×9): qty 250

## 2019-06-26 MED ORDER — SODIUM CHLORIDE 0.9 % IV SOLN
250.0000 [IU]/h | INTRAVENOUS | Status: DC
  Administered 2019-06-26: 250 [IU]/h via INTRAVENOUS_CENTRAL
  Administered 2019-06-27: 900 [IU]/h via INTRAVENOUS_CENTRAL
  Filled 2019-06-26 (×2): qty 2

## 2019-06-26 MED ORDER — FENTANYL BOLUS VIA INFUSION
200.0000 ug | Freq: Once | INTRAVENOUS | Status: AC
  Administered 2019-06-26: 200 ug via INTRAVENOUS

## 2019-06-26 MED ORDER — PRO-STAT SUGAR FREE PO LIQD
60.0000 mL | Freq: Two times a day (BID) | ORAL | Status: DC
  Administered 2019-06-26 – 2019-06-27 (×2): 60 mL
  Filled 2019-06-26 (×2): qty 60

## 2019-06-26 MED ORDER — PIPERACILLIN-TAZOBACTAM 3.375 G IVPB 30 MIN
3.3750 g | Freq: Four times a day (QID) | INTRAVENOUS | Status: DC
  Filled 2019-06-26 (×2): qty 50

## 2019-06-26 MED ORDER — POTASSIUM CHLORIDE 20 MEQ/15ML (10%) PO SOLN
20.0000 meq | Freq: Once | ORAL | Status: AC
  Administered 2019-06-26: 20 meq via ORAL
  Filled 2019-06-26: qty 15

## 2019-06-26 MED ORDER — HEPARIN BOLUS VIA INFUSION (CRRT)
1000.0000 [IU] | INTRAVENOUS | Status: DC | PRN
  Filled 2019-06-26: qty 1000

## 2019-06-26 MED ORDER — VANCOMYCIN HCL IN DEXTROSE 1-5 GM/200ML-% IV SOLN
1000.0000 mg | INTRAVENOUS | Status: DC
  Administered 2019-06-27: 1000 mg via INTRAVENOUS
  Filled 2019-06-26: qty 200

## 2019-06-26 MED ORDER — SODIUM CHLORIDE 0.45 % IV SOLN
Freq: Once | INTRAVENOUS | Status: AC
  Administered 2019-06-26: 12:00:00 via INTRAVENOUS

## 2019-06-26 NOTE — Progress Notes (Signed)
Dr. Nelda Marseille exchanged pt ETT due to a suspected cuff leak, pt was getting low VT and was desatting. Pt is now getting adequate volumes and good sats. Vitals are stable, RT will continue to monitor.

## 2019-06-26 NOTE — Progress Notes (Signed)
Nutrition Follow-up  DOCUMENTATION CODES:   Not applicable  INTERVENTION:   Tube feeding via post-pyloric Cortrak: - Vital 1.5 @ 50 ml/hr (1200 ml/day) - Pro-stat 60 ml BID - Free water per MD   Tube feeding regimen provides 2200 kcal, 141 grams of protein, and 917 ml of H2O.   NUTRITION DIAGNOSIS:   Increased nutrient needs related to post-op healing as evidenced by estimated needs.  Ongoing, being addressed via TF  GOAL:   Patient will meet greater than or equal to 90% of their needs  Met via TF  MONITOR:   Vent status, Weight trends, TF tolerance, Skin, I & O's  REASON FOR ASSESSMENT:   Consult Enteral/tube feeding initiation and management  ASSESSMENT:   63 year old male who presented on 9/30 for repair of abdominal aortic aneurysm and right common iliac artery aneurysm. Pt with significant intraoperative blood loss with subsequent hypotension requiring multiple blood products and pressors. PMH of CAD, skin cancer, GERD, HLD, HTN, NSTEMI.  10/3 - large emesis during transition to pressure support, NG tube placed to suction and TF d/c 10/6 - post-pyloric Cortrak placed  Discussed pt with RN and during ICU rounds. Noted plan to initiate CRRT today. Per RN, pt tolerating current TF without issue. RD to add Pro-stat given increased protein needs on CRRT.  Current TF via post-pyloric Cortrak: Vital 1.5 @ 50 ml/hr, Pro-stat 30 ml TID, free water 250 ml q 4 hours  NG tube remains in place to low intermittent suction.  Weight up 23 lbs since admit. Will utilize weight of 92.9 kg as EDW (BMI 26.28).  Patient is currently intubated on ventilator support MV: 12.1 L/min Temp (24hrs), Avg:98.4 F (36.9 C), Min:97.4 F (36.3 C), Max:99 F (37.2 C) BP (cuff): 123/93 MAP (cuff): 81  Drips: Precedex: 31.2 ml/hr Fentanyl: 20 ml/hr Levophed: 7.5 ml/hr  Medications reviewed and include: Colace, Solu-cortef, SSI q 4 hours, Protonix, IV abx, IV KCl 10 mEq x 4  runs  Labs reviewed: sodium 149, potassium 3.3, BUN 114, creatinine 3.06, phosphorus 5.1, hemoglobin 9.0 CBG's: 143-182 x 24 hours  UOP: 4375 ml x 24 hours NGT: 200 ml x 24 hours I/O's: +8.3 L since admit  Diet Order:   Diet Order            Diet NPO time specified  Diet effective now              EDUCATION NEEDS:   No education needs have been identified at this time  Skin:  Skin Assessment: Skin Integrity Issues: Skin Integrity Issues: Incisions: closed abdomen  Last BM:  06/26/19  Height:   Ht Readings from Last 1 Encounters:  06/20/19 _0  (1.88 m)    Weight:   Wt Readings from Last 1 Encounters:  06/26/19 103 kg    Ideal Body Weight:  86.4 kg  BMI:  Body mass index is 29.15 kg/m.  Estimated Nutritional Needs:   Kcal:  2092  Protein:  140-160 grams  Fluid:  >/= 2.0 L    Gaynell Face, MS, RD, LDN Inpatient Clinical Dietitian Pager: [phone] Weekend/After Hours: [phone]

## 2019-06-26 NOTE — Procedures (Signed)
Intubation Procedure Note Lawrence Wallace [id number] [birth year]  Procedure: Intubation Indications: Respiratory insufficiency  Procedure Details Consent: Unable to obtain consent because of emergent medical necessity. Time Out: Verified patient identification, verified procedure, site/side was marked, verified correct patient position, special equipment/implants available, medications/allergies/relevent history reviewed, required imaging and test results available.  Performed  Maximum sterile technique was used including gloves, hand hygiene and mask.  MAC    Evaluation Hemodynamic Status: BP stable throughout; O2 sats: stable throughout Patient's Current Condition: stable Complications: No apparent complications Patient did tolerate procedure well. Chest X-ray ordered to verify placement.  CXR: pending.   Lawrence Wallace 06/26/2019

## 2019-06-26 NOTE — Progress Notes (Signed)
Pharmacy Antibiotic Note  Lawrence Wallace is a 63 y.o. male  s/p aortic aneurysm repair with possible PNA (on zosyn for Ecoli PNA). Pharmacy consulted to dose vancomycin. CRRT started 10/8 -new cultures pending -WBC= 28.5 (on steroids, afeb   Plan: vanc 2000mg  IV x1 followed by 1000mg  IV q24h -Will follow cultures and clinical progress   Height: 6\' 2"  (188 cm) Weight: 227 lb 1.2 oz (103 kg) IBW/kg (Calculated) : 82.2  Temp (24hrs), Avg:98.4 F (36.9 C), Min:97.4 F (36.3 C), Max:99 F (37.2 C)  Recent Labs  Lab 06/21/19 1423 06/21/19 1908 06/21/19 2227 06/22/19 0311 06/23/19 0339 06/24/19 0428 06/24/19 1758 06/25/19 0352 06/25/19 1205 06/26/19 0438  WBC  --   --   --  11.1* 10.0 15.1*  --  16.2*  --  28.5*  CREATININE  --   --   --  1.92* 1.83* 2.37* 2.65* 2.86* 3.01* 3.06*  LATICACIDVEN 2.7* 2.4* 2.0*  --   --   --   --   --   --   --     Estimated Creatinine Clearance: 30.8 mL/min (A) (by C-G formula based on SCr of 3.06 mg/dL (H)).    No Known Allergies  Antimicrobials this admission: 10/2 vanc>>10/5; 10/8>> 10/2 zosyn>>  Dose adjustments this admission:   Microbiology results: 10/2 blood x2 neg 10/2 TA ecoli S zosyn non ESBL S ancef and rocephin  10/8 resp 10/8 blood x2  Thank you for allowing pharmacy to be a part of this patient's care.  Hildred Laser, PharmD Clinical Pharmacist **Pharmacist phone directory can now be found on [email or web address] ([email or web address]).  Listed under [place].

## 2019-06-26 NOTE — Progress Notes (Signed)
[facility]   Subjective  -remains intubated.  He was weaned aggressively on the vent yesterday from nearly 100% to 40% FiO2.  Overnight vent settings had to go back up after he had a bath and a bowel movement and got agitated.  He has had excellent response to diuresis with greater than 4 L urine output in the past 24 hours.  However with rising vent settings today plan is to start CRRT.  Remains afebrile and on minimal pressors at 1 mcg of Levophed.  White count is rising and that could be in the setting of stress dose steroids that he has received.  Previously has had a E. coli pneumonia and remains on zosyn.   Objective 95/61 78 (!) 97.4 F (36.3 C) (Axillary) 16 95%  Intake/Output Summary (Last 24 hours) at 06/26/2019 0846 Last data filed at 06/26/2019 0800 Gross per 24 hour  Intake 3732.16 ml  Output 4675 ml  Net -942.84 ml   Intubated, sedated, GCS 3 T - nursing reports following commands when sedation weaned Midline incision c/d/i Femoral pulses are palpable bilaterally Brisk doppler posterior tibial and dorsalis pedis signals bilaterally Left radial occluded distally, left ulnar artery triphasic, duskiness of left middle finger improved  Laboratory Lab Results: Recent Labs    06/25/19 0352 06/26/19 0438  WBC 16.2* 28.5*  HGB 8.3* 9.0*  HCT 25.6* 27.7*  PLT 143* 192   BMET Recent Labs    06/25/19 1205 06/25/19 1950 06/26/19 0438  NA 151*  --  149*  K 3.1* 3.0* 3.3*  CL 110  --  109  CO2 25  --  23  GLUCOSE 175*  --  179*  BUN 106*  --  114*  CREATININE 3.01*  --  3.06*  CALCIUM 7.3*  --  6.9*    COAG Lab Results  Component Value Date   INR 1.5 (H) 06/16/2019   INR 1.0 06/16/2019   No results found for: PTT  Assessment/Planning:  Postop day 8 status post open aortic aneurysm repair as well as right common iliac aneurysm repair with bifurcated dacron graft.  Remains intubated in the ICU critically ill.  Neuro: GCS 3 T  this morning and intubated and sedated.  Encephalopathy. Very agitated when sedation weaned.  On precedex and low dose fentanyl.  Nursing now reports he is following commands when sedation weaned.  I did get him to squeeze my hand yesterday. Cardiovascular: Vasopressin has remained off for several days, Levophed down to 1-2 mcg (ws weaned off and then he got agitated and restarted).  Hgb 9.6 --> 9.9 --> 10 --> 8.7 --> 8.8. --> 8.4 --> 8.3 --> 9.0, no indication for transfusion.  Platelets improving.  HIT negative previously.   Pulmonary: Remains intubated.  Vent on Peep 8 and FiO2 back to 100%, has been getting lasix gtt for diuresis, needs improvement of pulmonary funtion to be extubated, plan to start CRRT today, e. Coli pneumonia, diuresis limited by rising creatinine.  Appreciate nephrology input GI: CT abd/pelvis yesterday Saturday, no signs of thickened bowel, pnematosis, or free air over weekend.  No plans for abdominal exploration.  Continues to have normal bowel function.  Tube feeds via post-pyloric tube. Transpyloric tube placed. Trickle feeds he seems to be tolerating. Renal: Diuresis yesterday with lasix schedule.  Cr 2.22 --> 2.07 --> 2.15 --> 1.92 --> 1.83 --> 2.37 --> 2.65 --> 2.86 --> 3.05, BUN rising to 106, making excellent urine > 4 L after diuresis.  Lasix drip  stopped.  Nephrology following.  Plan to place RIJ vac cath and start CRRT to get volume off for extubation Liver: Shock liver, enzymes improved FEN: pH improved 7.38. ID: BC no growth to date, tracheal aspirate e coli.  on zosyn, suspect pneumonia, e. Coli pneumonia.  I discussed with critical care about new blood cultures and tracheal aspirate today given rising leukocytosis.  He has been afebrile.  This may be in the setting of stress dose steroids.  I would like to broaden him back out to vanc and Zosyn renally dosed. Continue lovenox daily for DVT prophylaxis.      Appreciate ongoing critical care support.  He did  really well yesterday weaning his vent.  Unfortunately vent settings went back up last night when he got agitated.  We will start CRRT today after placement of a right IJ Vas-Cath.  Also discussed broadening out his antibiotics with rising leukocytosis and new cultures.  This may be related to stress dose steroids but I do not want take any chances.  [name], MD [facility] [place] Office: [phone] Pager: [phone]   [name] 06/26/2019 8:46 AM --

## 2019-06-26 NOTE — Significant Event (Addendum)
 Pulmonary Critical Care   06/26/19 11:22 am Develoiped a fib RVR  160's HD stable. On Lopressor at home   General:  63 yo sedated on vent HEENT: OTT in place Neuro: sedated rom recent intubation CV: intermittent Afib/flutter with RVR PULM: Decreased in bases GI: soft, bsx4 active  Extremities: warm/dry,+ edema  Skin: no rashes or lesions  I/p New Afib with RVR. On BB at home.(lopressor 25 mg bid) 12 lead EKG now Lopressor 2.5 mg IV now Lopressor 25 mg per tube BID .45 saline 1 litre Check tropin's    Richardson Landry Minor ACNP [facility] Pager [phone] till 1 pm If no answer page [phone] 06/26/2019, 11:28 AM

## 2019-06-26 NOTE — Progress Notes (Addendum)
[facility] Progress Note  Name: TOR TSUDA MRN: [id number] DOB: 1956-01-24  Chief Complaint:  AAA repair   Subjective:  Lasix gtt turned off yesterday and he received bolus lasix.  Received another dose of metolazone yesterday.  Stopped scheduled after discussion with pulm as pt had improved to FiO2 40/PEEP 5.  He had 4.1 liters UOP over 10/7.  His oxygen requirement went up at the beginning of night shift and then when cleaned after a BM earlier this AM he had to go back up to 100%.    Review of systems:  Unable to obtain 2/2 mechanical ventilation  ----------- Background on consult:  HPI: Theoplis D Quevedo is a 63 y.o. male with PMH of CAD, NSTEMI S/P PTCI to mid circ 12/2018, HTN, HLD, tobacco abuse. Patient was admitted by Dr. Carlis Abbott 05/23/2019 for repair of 7 cm abdominal aortic aneurysm and right common iliac aneurysm repair. Post Op course complicated by EBL 5400 with hypovolemic shock requiring pressors and transfusion. Course further complicated by acute hypoxemic respiratory failure and sepsis-tracheal  aspirate positive for E. Coli-he was started on Vancomycin and Zosyn which has now been narrowed to zosyn. Critical care following for vent management.  SCr prior to admission was 0.72-0.8 with EGFR >60. Since admission, SCr has progressively climbed to 2.37 BUN 69 EGFR 28 NA 152 Ca 7.0 C Ca 8.76 Phos 5.5 today. Urine output progressively declined, he is currently positive 10,476 cc. He has been started on furosemide drip at 10 mg/hr with one time dose of metolazone 10 mg PO today. Urine output has increased but remains overtly volume overloaded. Pressors have been weaned to low dose levophed at 4 mcg/min. He is not acidotic. Last HGB 8.4. He is sedated on full ventilator support.   Intake/Output Summary (Last 24 hours) at 06/26/2019 0540 Last data filed at 06/26/2019 0400 Gross per 24 hour  Intake 2777.98 ml  Output 4625 ml  Net -1847.02 ml    Vitals:  Vitals:   06/26/19 0300 06/26/19 0400 06/26/19 0417 06/26/19 0532  BP: (!) 94/55     Pulse: 83 (!) 119    Resp: 19 (!) 26    Temp:      TempSrc:      SpO2: 94% 96%  96%  Weight:   103 kg   Height:         Physical Exam: General adult male intubated critically ill  HEENT normocephalic atraumatic  Lungs coarse mechanical breath sounds  Heart RRR; no rub Abdomen soft nontender nondistended Extremities 2+ edema bilaterally upper and lower extremities  Psych no agitation  Neuro - continuous sedation   Medications reviewed   Labs:  BMP Latest Ref Rng & Units 06/26/2019 06/25/2019 06/25/2019  Glucose 70 - 99 mg/dL 179(H) - 175(H)  BUN 8 - 23 mg/dL 114(H) - 106(H)  Creatinine 0.61 - 1.24 mg/dL 3.06(H) - 3.01(H)  Sodium 135 - 145 mmol/L 149(H) - 151(H)  Potassium 3.5 - 5.1 mmol/L 3.3(L) 3.0(L) 3.1(L)  Chloride 98 - 111 mmol/L 109 - 110  CO2 22 - 32 mmol/L 23 - 25  Calcium 8.9 - 10.3 mg/dL 6.9(L) - 7.3(L)     Assessment/Plan:   # AKI  - multifactorial prerenal and ischemic insults.  Note concurrent shock liver.  Now with worsening with diuresis to optimize volume status.  CT with normal kidneys and no hydro a few days ago.  Urinalysis negative for protein or red blood cells.  His wife would want him to have  dialysis if needed.   - Excellent response to lasix however remains overloaded now back on 100% FIO2 and progressive azotemia  - Will initiate CRRT and will contact team regarding access placement; 4K bath   - replete K and calcium   - spoke with his wife and updated her this AM  # Shock-multifactorial.  History of hemorrhagic shock and septic shock.   - On low-dose levo    # Acute hypoxic respiratory failure on mechanical ventilation.  Optimizing volume status    # Acute blood loss anemia - no acute indication for PRBC's    # Hypokalemia - repleting.  Losses with lasix    # Hypernatremia he is on D5W and enteric free water 250 mL every 4 hours    # E. coli pneumonia  antibiotics per primary team.  Would dose Zosyn for renal function   # AAA status post repair per vascular surgery  # Hypoalbuminemia nutrition per primary team  Claudia Desanctis, MD 06/26/2019 5:40 AM

## 2019-06-26 NOTE — Progress Notes (Addendum)
NAME:  Lawrence Wallace, MRN:  [id number], DOB:  1955/10/05, LOS: 38 ADMISSION DATE:  06/12/2019, CONSULTATION DATE:  06/13/2019 REFERRING MD:  Carlis Abbott  CHIEF COMPLAINT:  Vent Management   Brief History   Lawrence Wallace is a 63 y.o. male who was admitted 9/30 for repair of large AAA.  He had significant intraoperative blood loss with subsequent hypotension requiring 5L IVF's, 2u PRBC, 2 FFP, 2.5L from cellsaver, and neosynephrine.  Upon leaving PACU, he was somewhat hypertensive and was started on cleviprex.  History of present illness   Pt is encephelopathic; therefore, this HPI is obtained from chart review. Lawrence Wallace is a 63 y.o. male who has a PMH including but not limited to tobacco, abuse, AAA, NSTEMI, HTN, HLD, CAD, skin CA, GERD.  He presented to [facility] 9/30 for repair of 7cm AAA and 5cm R common iliac artery aneurysm.  He had significant blood loss intraoperatively and required 5L IVF's, 2u PRBC, 2 FFP, 2.5L cell saver, and neosynephrine.  Just prior to leaving PACU, he was noted to be hypertensive and was started on cleviprex.  Neosynephrine was discontinued upon arrival to ICU and cleviprex is being titrated down.  Past Medical History  tobacco, abuse, AAA, NSTEMI, HTN, HLD, CAD, skin CA, GERD.  Significant Hospital Events   9/30 > admitted for OR repair of 7cm AAA and 5cm R common iliac artery aneurysm. 10/1-10/2 > Difficult to wean off sedation or vent due to agitation. Started on seroquel 10/3 > Tolerating PS   Consults:  PCCM.  Procedures:  ETT 9/30 >  L rad art line 9/30 >  R swan 9/30 > Cordis DC'd 06/26/2019 Right IJ HD catheter>> Significant Diagnostic Tests:  TTE 06/19/19 - EF 60-65% No WMA or valvular abnormalities  Micro Data:  Blood 10/3>>>NTD Urine 10/3>>>NTD Sputum 10/3>>>E coli resistant to amp and unasyn, sensitive to zosyn 06/26/2019 sputum>> 06/26/2019 blood culture>> Antimicrobials:  Vanc 10/3>>>10/5 restarted on 06/26/2019>> Zosyn 10/3>>>  Interim  history/subjective:  Increasing FiO2 needs.  Hemodialysis catheter to be placed.  Objective:  Blood pressure (!) 92/59, pulse 76, temperature (!) 97.4 F (36.3 C), temperature source Axillary, resp. rate (!) 26, height 6\' 2"  (1.88 m), weight 103 kg, SpO2 97 %. CVP:  [3 mmHg-7 mmHg] 3 mmHg  Vent Mode: PRVC FiO2 (%):  [40 %-100 %] 70 % Set Rate:  [20 bmp] 20 bmp Vt Set:  [650 mL] 650 mL PEEP:  [5 cmH20-10 cmH20] 8 cmH20 Plateau Pressure:  [21 cmH20-26 cmH20] 26 cmH20   Intake/Output Summary (Last 24 hours) at 06/26/2019 0948 Last data filed at 06/26/2019 [id number] Gross per 24 hour  Intake 3718.14 ml  Output 4560 ml  Net -841.86 ml   Filed Weights   06/24/19 0413 06/25/19 0328 06/26/19 0417  Weight: 104.4 kg 103.3 kg 103 kg   Physical Exam: General: Jaundiced male currently sedated on full mechanical ventilatory support with FiO2 70% HEENT: Endotracheal tube in place.  Right IJ cordis to be changed to a hemodialysis catheter. Neuro:  CV: Heart sounds regular regular rate rhythm PULM: Coarse rhonchi bilaterally decreased breath sounds at the bases GI: soft, bsx4 active  Extremities: warm/dry, 2+ edema  Skin: no rashes or lesions  Assessment & Plan:  63 year old male with AAA and bilateral common iliac aneurysms s/p elective open AAA repair with intra-op EBL 6600 ml. He was resuscitated with 5.2L LR, 2.5L cellsaver, 2U PRBC and 2U FFP.   Hemorrhagic Shock - resolved Septic shock Off pressors  at present HGB 8.4 on 10/6 WBC up trending 10/6, Sputum + for E coli Wean pressors as needed Broaden antibiotics to Zosyn and vancomycin Continue stress dose steroids   Agitation requiring sedation Fentanyl Precedex Continue Seroquel   Acute respiratory failure + 10.8 L   Intake/Output Summary (Last 24 hours) at 06/26/2019 0948 Last data filed at 06/26/2019 [id number] Gross per 24 hour  Intake 3718.14 ml  Output 4560 ml  Net -841.86 ml   FiO2 is come back up to 70% despite being a  negative I&O Reculture sputum Wean FiO2 as tolerated Not a candidate for weaning at this time     AKI, oliguric. Likely ATN in setting of shock Creatinine and BUN up trending 10/6 Hypokalemia Hypernatremia Hyperphos Lab Results  Component Value Date   CREATININE 3.06 (H) 06/26/2019   CREATININE 3.01 (H) 06/25/2019   CREATININE 2.86 (H) 06/25/2019   Recent Labs  Lab 06/25/19 1205 06/25/19 1950 06/26/19 0438  K 3.1* 3.0* 3.3*   Recent Labs  Lab 06/25/19 0352 06/25/19 1205 06/26/19 0438  NA 150* 151* 149*   Renal is requesting HD catheter D5W Free water Replete electrolytes as needed     Incidental AAA s/p open repair  7.4cm infrarenal aneurysm, EBL 6 units with 2.5 units returned via cell-saver   Was hypotensive peri-operatively and started on Neo, but then hypertensive post-op and initiated on Cleviprex Per vascular vein specialist  Transaminitis Continue to monitor  Acute blood loss anemia Post-op thrombocytopenia>> resolving  Recent Labs    06/25/19 0352 06/26/19 0438  HGB 8.3* 9.0*    Continue to monitor As per protocol   Hx HTN, CAD, NSTEMI Continue to hold home Lopressor Plavix and aspirin statin  Hypomag Monitor replete as needed   Discussion Nephrology is requested hemodialysis catheter be placed 06/26/2019.  Surgery was requested to broaden antimicrobial therapy reculture 06/26/2019.  Intake/Output Summary (Last 24 hours) at 06/26/2019 0948 Last data filed at 06/26/2019 [id number] Gross per 24 hour  Intake 3718.14 ml  Output 4560 ml  Net -841.86 ml    Labs   CBC: Recent Labs  Lab 06/22/19 0311 06/23/19 0339  06/24/19 0330 06/24/19 0428 06/25/19 0350 06/25/19 0352 06/26/19 0438  WBC 11.1* 10.0  --   --  15.1*  --  16.2* 28.5*  NEUTROABS 9.0*  --   --   --   --   --   --  26.3*  HGB 8.7* 8.8*   < > 7.8* 8.4* 8.2* 8.3* 9.0*  HCT 27.5* 25.8*   < > 23.0* 25.7* 24.0* 25.6* 27.7*  MCV 96.5 93.1  --   --  95.5  --  95.2 94.2  PLT  68* 75*  --   --  119*  --  143* 192   < > = values in this interval not displayed.   Basic Metabolic Panel: Recent Labs  Lab 06/21/19 1645 06/22/19 0311 06/23/19 0339  06/24/19 0428 06/24/19 1758 06/25/19 0350 06/25/19 0352 06/25/19 1205 06/25/19 1950 06/26/19 0438  NA  --  144 149*   < > 152* 152* 151* 150* 151*  --  149*  K  --  3.6 3.3*   < > 3.3* 3.4* 3.2* 3.1* 3.1* 3.0* 3.3*  CL  --  110 114*  --  114* 116*  --  112* 110  --  109  CO2  --  24 25  --  24 24  --  25 25  --  23  GLUCOSE  --  109* 105*  --  114* 142*  --  171* 175*  --  179*  BUN  --  52* 50*  --  69* 83*  --  96* 106*  --  114*  CREATININE  --  1.92* 1.83*  --  2.37* 2.65*  --  2.86* 3.01*  --  3.06*  CALCIUM  --  6.8* 7.0*  --  7.0* 6.8*  --  7.1* 7.3*  --  6.9*  MG 1.9 2.0 2.2  --  2.4  --   --  2.4  --   --  2.2  PHOS 4.4  --  3.8  --  5.5*  --   --  5.6*  --   --  5.1*   < > = values in this interval not displayed.   GFR: Estimated Creatinine Clearance: 30.8 mL/min (A) (by C-G formula based on SCr of 3.06 mg/dL (H)). Recent Labs  Lab 06/21/19 1423 06/21/19 1908 06/21/19 2227  06/23/19 0339 06/24/19 0428 06/25/19 0352 06/26/19 0438  PROCALCITON  --   --   --    < > 7.14 8.04 7.23 5.51  WBC  --   --   --    < > 10.0 15.1* 16.2* 28.5*  LATICACIDVEN 2.7* 2.4* 2.0*  --   --   --   --   --    < > = values in this interval not displayed.   Liver Function Tests: Recent Labs  Lab 06/20/19 0326 06/21/19 0315 06/22/19 0311 06/25/19 0352  AST 1,150* 412* 160* 299*  ALT 446* 268* 154* 135*  ALKPHOS 37* 43 53 63  BILITOT 3.0* 2.8* 4.0* 2.9*  PROT 4.4* 4.4* 4.4* 5.1*  ALBUMIN 2.5* 1.8* 1.8* 1.4*   Recent Labs  Lab 06/22/19 0311  LIPASE 57*   No results for input(s): AMMONIA in the last 168 hours. ABG    Component Value Date/Time   PHART 7.386 06/26/2019 0540   PCO2ART 41.3 06/26/2019 0540   PO2ART 66.6 (L) 06/26/2019 0540   HCO3 24.2 06/26/2019 0540   TCO2 27 06/25/2019 0350    ACIDBASEDEF 0.2 06/26/2019 0540   O2SAT 90.5 06/26/2019 0540    Coagulation Profile: No results for input(s): INR, PROTIME in the last 168 hours. Cardiac Enzymes: No results for input(s): CKTOTAL, CKMB, CKMBINDEX, TROPONINI in the last 168 hours. HbA1C: Hgb A1c MFr Bld  Date/Time Value Ref Range Status  06/24/2019 10:04 PM 5.7 (H) 4.8 - 5.6 % Final    Comment:    (NOTE) Pre diabetes:          5.7%-6.4% Diabetes:              >6.4% Glycemic control for   <7.0% adults with diabetes   12/10/2017 09:20 AM 5.9 4.6 - 6.5 % Final    Comment:    Glycemic Control Guidelines for People with Diabetes:Non Diabetic:  <6%Goal of Therapy: <7%Additional Action Suggested:  >8%    CBG: Recent Labs  Lab 06/25/19 1706 06/25/19 2004 06/26/19 0033 06/26/19 0405 06/26/19 0757  GLUCAP 161* 143* 171* 149* 182*   CC APP time 30 minutes  Richardson Landry Minor ACNP [facility] PCCM Pager [phone] till 1 pm If no answer page [phone] 06/26/2019, 9:48 AM  Attending Note:  63 year old male with extensive PMH who presents to PCCM with respiratory failure post AAA surgery.  Patient overnight required higher O2 need but I am concerned that the patient pulled his ETT out resulting in that.  On exam, significant air leak with the cuff up.  Based on that the patient was reintubated.  Seen by nephrology.  Patient is to start CRRT today with hopefully significant volume negative.  Target for today is to get FiO2 and PEEP down to 40 and 5 since now reintubated and the airway is secured.  Pressors will likely need to be continued given volume negative.  PCCM will continue to follow.  The patient is critically ill with multiple organ systems failure and requires high complexity decision making for assessment and support, frequent evaluation and titration of therapies, application of advanced monitoring technologies and extensive interpretation of multiple databases.   Critical Care Time devoted to patient care services  described in this note is  37  Minutes. This time reflects time of care of this signee Dr [name]. This critical care time does not reflect procedure time, or teaching time or supervisory time of PA/NP/Med student/Med Resident etc but could involve care discussion time.  Rush Farmer, M.D. [facility] Pulmonary/Critical Care Medicine. Pager: [phone]. After hours pager: [phone].

## 2019-06-26 NOTE — Procedures (Signed)
Hemodialysis Insertion Procedure Note JOMES POPELKA [id number] [birth year]  Procedure: Insertion of Hemodialysis Catheter Type: 3 port  Indications: Hemodialysis   Procedure Details Consent: Risks of procedure as well as the alternatives and risks of each were explained to the (patient/caregiver).  Consent for procedure obtained. Time Out: Verified patient identification, verified procedure, site/side was marked, verified correct patient position, special equipment/implants available, medications/allergies/relevent history reviewed, required imaging and test results available.  Performed  Maximum sterile technique was used including antiseptics, cap, gloves, gown, hand hygiene, mask and sheet. Skin prep: Chlorhexidine; local anesthetic administered A antimicrobial bonded/coated triple lumen catheter was placed in the right internal jugular vein using the Seldinger technique. Ultrasound guidance used.Yes.   Catheter placed to 16 cm. Blood aspirated via all 3 ports and then flushed x 3. Line sutured x 2 and dressing applied.  Evaluation Blood flow good Complications: No apparent complications Patient did tolerate procedure well. Chest X-ray ordered to verify placement.  CXR: pending.  Richardson Landry Zarra Geffert ACNP [facility] P[facility] Pager [phone] till 1 pm If no answer page [phone] 06/26/2019, 10:37 AM

## 2019-06-27 ENCOUNTER — Inpatient Hospital Stay (HOSPITAL_COMMUNITY): Payer: Medicare Other

## 2019-06-27 DIAGNOSIS — E872 Acidosis: Secondary | ICD-10-CM

## 2019-06-27 DIAGNOSIS — I4819 Other persistent atrial fibrillation: Secondary | ICD-10-CM

## 2019-06-27 DIAGNOSIS — R6521 Severe sepsis with septic shock: Secondary | ICD-10-CM

## 2019-06-27 DIAGNOSIS — A419 Sepsis, unspecified organism: Secondary | ICD-10-CM

## 2019-06-27 LAB — POCT ACTIVATED CLOTTING TIME
Activated Clotting Time: 131 seconds
Activated Clotting Time: 147 seconds
Activated Clotting Time: 147 seconds
Activated Clotting Time: 153 seconds
Activated Clotting Time: 158 seconds
Activated Clotting Time: 158 seconds
Activated Clotting Time: 180 seconds
Activated Clotting Time: 191 seconds
Activated Clotting Time: 208 seconds
Activated Clotting Time: 439 seconds
Activated Clotting Time: 142 seconds

## 2019-06-27 LAB — CBC WITH DIFFERENTIAL/PLATELET
Abs Immature Granulocytes: 0.33 10*3/uL — ABNORMAL HIGH (ref 0.00–0.07)
Basophils Absolute: 0.1 10*3/uL (ref 0.0–0.1)
Basophils Relative: 0 %
Eosinophils Absolute: 0 10*3/uL (ref 0.0–0.5)
Eosinophils Relative: 0 %
HCT: 29 % — ABNORMAL LOW (ref 39.0–52.0)
Hemoglobin: 8.9 g/dL — ABNORMAL LOW (ref 13.0–17.0)
Immature Granulocytes: 1 %
Lymphocytes Relative: 4 %
Lymphs Abs: 1.4 10*3/uL (ref 0.7–4.0)
MCH: 30.5 pg (ref 26.0–34.0)
MCHC: 30.7 g/dL (ref 30.0–36.0)
MCV: 99.3 fL (ref 80.0–100.0)
Monocytes Absolute: 1 10*3/uL (ref 0.1–1.0)
Monocytes Relative: 3 %
Neutro Abs: 29.6 10*3/uL — ABNORMAL HIGH (ref 1.7–7.7)
Neutrophils Relative %: 92 %
Platelets: 185 10*3/uL (ref 150–400)
RBC: 2.92 MIL/uL — ABNORMAL LOW (ref 4.22–5.81)
RDW: 15.9 % — ABNORMAL HIGH (ref 11.5–15.5)
Smear Review: ADEQUATE
WBC: 32.4 10*3/uL — ABNORMAL HIGH (ref 4.0–10.5)
nRBC: 0.2 % (ref 0.0–0.2)

## 2019-06-27 LAB — POCT I-STAT 7, (LYTES, BLD GAS, ICA,H+H)
Acid-base deficit: 16 mmol/L — ABNORMAL HIGH (ref 0.0–2.0)
Acid-base deficit: 7 mmol/L — ABNORMAL HIGH (ref 0.0–2.0)
Acid-base deficit: 5 mmol/L — ABNORMAL HIGH (ref 0.0–2.0)
Bicarbonate: 13.4 mmol/L — ABNORMAL LOW (ref 20.0–28.0)
Bicarbonate: 19.7 mmol/L — ABNORMAL LOW (ref 20.0–28.0)
Bicarbonate: 22.8 mmol/L (ref 20.0–28.0)
Calcium, Ion: 1 mmol/L — ABNORMAL LOW (ref 1.15–1.40)
Calcium, Ion: 1.02 mmol/L — ABNORMAL LOW (ref 1.15–1.40)
Calcium, Ion: 0.98 mmol/L — ABNORMAL LOW (ref 1.15–1.40)
HCT: 31 % — ABNORMAL LOW (ref 39.0–52.0)
HCT: 27 % — ABNORMAL LOW (ref 39.0–52.0)
HCT: 29 % — ABNORMAL LOW (ref 39.0–52.0)
Hemoglobin: 10.5 g/dL — ABNORMAL LOW (ref 13.0–17.0)
Hemoglobin: 9.2 g/dL — ABNORMAL LOW (ref 13.0–17.0)
Hemoglobin: 9.9 g/dL — ABNORMAL LOW (ref 13.0–17.0)
O2 Saturation: 95 %
O2 Saturation: 95 %
O2 Saturation: 91 %
Patient temperature: 90
Patient temperature: 34.6
Potassium: 3.7 mmol/L (ref 3.5–5.1)
Potassium: 4.7 mmol/L (ref 3.5–5.1)
Potassium: 4.5 mmol/L (ref 3.5–5.1)
Sodium: 152 mmol/L — ABNORMAL HIGH (ref 135–145)
Sodium: 145 mmol/L (ref 135–145)
Sodium: 145 mmol/L (ref 135–145)
TCO2: 15 mmol/L — ABNORMAL LOW (ref 22–32)
TCO2: 21 mmol/L — ABNORMAL LOW (ref 22–32)
TCO2: 25 mmol/L (ref 22–32)
pCO2 arterial: 43.6 mmHg (ref 32.0–48.0)
pCO2 arterial: 37.3 mmHg (ref 32.0–48.0)
pCO2 arterial: 51.4 mmHg — ABNORMAL HIGH (ref 32.0–48.0)
pH, Arterial: 7.097 — CL (ref 7.350–7.450)
pH, Arterial: 7.304 — ABNORMAL LOW (ref 7.350–7.450)
pH, Arterial: 7.242 — ABNORMAL LOW (ref 7.350–7.450)
pO2, Arterial: 102 mmHg (ref 83.0–108.0)
pO2, Arterial: 66 mmHg — ABNORMAL LOW (ref 83.0–108.0)
pO2, Arterial: 64 mmHg — ABNORMAL LOW (ref 83.0–108.0)

## 2019-06-27 LAB — RENAL FUNCTION PANEL
Albumin: 1.2 g/dL — ABNORMAL LOW (ref 3.5–5.0)
Albumin: 1.3 g/dL — ABNORMAL LOW (ref 3.5–5.0)
Anion gap: 10 (ref 5–15)
Anion gap: 18 — ABNORMAL HIGH (ref 5–15)
BUN: 52 mg/dL — ABNORMAL HIGH (ref 8–23)
BUN: 74 mg/dL — ABNORMAL HIGH (ref 8–23)
CO2: 19 mmol/L — ABNORMAL LOW (ref 22–32)
CO2: 20 mmol/L — ABNORMAL LOW (ref 22–32)
Calcium: 5.3 mg/dL — CL (ref 8.9–10.3)
Calcium: 6.7 mg/dL — ABNORMAL LOW (ref 8.9–10.3)
Chloride: 114 mmol/L — ABNORMAL HIGH (ref 98–111)
Chloride: 109 mmol/L (ref 98–111)
Creatinine, Ser: 1.1 mg/dL (ref 0.61–1.24)
Creatinine, Ser: 2.01 mg/dL — ABNORMAL HIGH (ref 0.61–1.24)
GFR calc Af Amer: >60 mL/min (ref >60)
GFR calc Af Amer: 39 mL/min — ABNORMAL LOW (ref >60)
GFR calc non Af Amer: >60 mL/min (ref >60)
GFR calc non Af Amer: 34 mL/min — ABNORMAL LOW (ref >60)
Glucose, Bld: 166 mg/dL — ABNORMAL HIGH (ref 70–99)
Glucose, Bld: 226 mg/dL — ABNORMAL HIGH (ref 70–99)
Phosphorus: 2.7 mg/dL (ref 2.5–4.6)
Phosphorus: 7 mg/dL — ABNORMAL HIGH (ref 2.5–4.6)
Potassium: 2.6 mmol/L — CL (ref 3.5–5.1)
Potassium: 4.7 mmol/L (ref 3.5–5.1)
Sodium: 143 mmol/L (ref 135–145)
Sodium: 147 mmol/L — ABNORMAL HIGH (ref 135–145)

## 2019-06-27 LAB — LACTIC ACID, PLASMA
Lactic Acid, Venous: 4.5 mmol/L (ref 0.5–1.9)
Lactic Acid, Venous: 6.6 mmol/L (ref 0.5–1.9)
Lactic Acid, Venous: 7.7 mmol/L (ref 0.5–1.9)

## 2019-06-27 LAB — GLUCOSE, CAPILLARY
Glucose-Capillary: 147 mg/dL — ABNORMAL HIGH (ref 70–99)
Glucose-Capillary: 237 mg/dL — ABNORMAL HIGH (ref 70–99)
Glucose-Capillary: 271 mg/dL — ABNORMAL HIGH (ref 70–99)
Glucose-Capillary: 185 mg/dL — ABNORMAL HIGH (ref 70–99)

## 2019-06-27 LAB — MAGNESIUM: Magnesium: 1.7 mg/dL (ref 1.7–2.4)

## 2019-06-27 LAB — APTT: aPTT: 64 seconds — ABNORMAL HIGH (ref 24–36)

## 2019-06-27 LAB — PROCALCITONIN: Procalcitonin: 2.25 ng/mL

## 2019-06-27 MED ORDER — AMIODARONE HCL IN DEXTROSE 360-4.14 MG/200ML-% IV SOLN
30.0000 mg/h | INTRAVENOUS | Status: DC
  Administered 2019-06-27: 30 mg/h via INTRAVENOUS

## 2019-06-27 MED ORDER — PIPERACILLIN-TAZOBACTAM 3.375 G IVPB 30 MIN
3.3750 g | Freq: Four times a day (QID) | INTRAVENOUS | Status: DC
  Filled 2019-06-27 (×3): qty 50

## 2019-06-27 MED ORDER — MIDAZOLAM HCL 2 MG/2ML IJ SOLN: 1.0000 mg | INTRAMUSCULAR | Status: DC | PRN

## 2019-06-27 MED ORDER — VECURONIUM BROMIDE 10 MG IV SOLR
7.0000 mg | Freq: Once | INTRAVENOUS | Status: AC
  Administered 2019-06-27: 7 mg via INTRAVENOUS

## 2019-06-27 MED ORDER — SODIUM BICARBONATE 8.4 % IV SOLN
INTRAVENOUS | Status: AC
  Filled 2019-06-27: qty 100

## 2019-06-27 MED ORDER — MIDAZOLAM 50MG/50ML (1MG/ML) PREMIX INFUSION
0.0000 mg/h | INTRAVENOUS | Status: DC
  Administered 2019-06-27: 5 mg/h via INTRAVENOUS
  Filled 2019-06-27: qty 50

## 2019-06-27 MED ORDER — GLYCOPYRROLATE 0.2 MG/ML IJ SOLN: 0.2000 mg | INTRAMUSCULAR | Status: DC | PRN

## 2019-06-27 MED ORDER — POTASSIUM CHLORIDE 10 MEQ/50ML IV SOLN
10.0000 meq | INTRAVENOUS | Status: AC
  Administered 2019-06-27 (×3): 10 meq via INTRAVENOUS
  Filled 2019-06-27 (×4): qty 50

## 2019-06-27 MED ORDER — STERILE WATER FOR INJECTION IV SOLN
INTRAVENOUS | Status: DC
  Administered 2019-06-27: 11:00:00 via INTRAVENOUS
  Filled 2019-06-27: qty 850

## 2019-06-27 MED ORDER — PHENYLEPHRINE HCL-NACL 40-0.9 MG/250ML-% IV SOLN: 0.0000 ug/min | INTRAVENOUS | Status: DC

## 2019-06-27 MED ORDER — DEXTROSE 5 % IV SOLN: INTRAVENOUS | Status: DC

## 2019-06-27 MED ORDER — POLYVINYL ALCOHOL 1.4 % OP SOLN
1.0000 [drp] | Freq: Four times a day (QID) | OPHTHALMIC | Status: DC | PRN
  Filled 2019-06-27: qty 15

## 2019-06-27 MED ORDER — SODIUM BICARBONATE 8.4 % IV SOLN
100.0000 meq | Freq: Once | INTRAVENOUS | Status: AC
  Administered 2019-06-27 (×2): 100 meq via INTRAVENOUS

## 2019-06-27 MED ORDER — DEXTROSE 5 % IV SOLN
Status: DC
  Administered 2019-06-27: 15:00:00 via INTRAVENOUS_CENTRAL
  Filled 2019-06-27 (×2): qty 1500

## 2019-06-27 MED ORDER — AMIODARONE HCL IN DEXTROSE 360-4.14 MG/200ML-% IV SOLN
INTRAVENOUS | Status: AC
  Administered 2019-06-27: 09:00:00 via INTRAVENOUS
  Filled 2019-06-27: qty 200

## 2019-06-27 MED ORDER — DIPHENHYDRAMINE HCL 50 MG/ML IJ SOLN: 25.0000 mg | INTRAMUSCULAR | Status: DC | PRN

## 2019-06-27 MED ORDER — PHENYLEPHRINE HCL-NACL 40-0.9 MG/250ML-% IV SOLN
0.0000 ug/min | INTRAVENOUS | Status: DC
  Administered 2019-06-27: 400 ug/min via INTRAVENOUS
  Administered 2019-06-27: 340 ug/min via INTRAVENOUS
  Filled 2019-06-27 (×7): qty 250

## 2019-06-27 MED ORDER — SODIUM CHLORIDE 0.9 % IV SOLN
0.0000 ug/min | INTRAVENOUS | Status: DC
  Administered 2019-06-27 (×2): 400 ug/min via INTRAVENOUS
  Filled 2019-06-27 (×4): qty 8

## 2019-06-27 MED ORDER — MIDAZOLAM BOLUS VIA INFUSION (WITHDRAWAL LIFE SUSTAINING TX)
2.0000 mg | INTRAVENOUS | Status: DC | PRN
  Filled 2019-06-27: qty 2

## 2019-06-27 MED ORDER — METOPROLOL TARTRATE 5 MG/5ML IV SOLN
2.5000 mg | Freq: Once | INTRAVENOUS | Status: AC
  Administered 2019-06-27: 2.5 mg via INTRAVENOUS

## 2019-06-27 MED ORDER — GLYCOPYRROLATE 1 MG PO TABS
1.0000 mg | ORAL_TABLET | ORAL | Status: DC | PRN
  Filled 2019-06-27: qty 1

## 2019-06-27 MED ORDER — METOPROLOL TARTRATE 5 MG/5ML IV SOLN
INTRAVENOUS | Status: AC
  Filled 2019-06-27: qty 5

## 2019-06-27 MED ORDER — SODIUM BICARBONATE 8.4 % IV SOLN
100.0000 meq | Freq: Once | INTRAVENOUS | Status: AC
  Administered 2019-06-27: 100 meq via INTRAVENOUS

## 2019-06-27 MED ORDER — SODIUM BICARBONATE 8.4 % IV SOLN
INTRAVENOUS | Status: AC
  Administered 2019-06-27: 100 meq via INTRAVENOUS
  Filled 2019-06-27: qty 100

## 2019-06-27 MED ORDER — VECURONIUM BROMIDE 10 MG IV SOLR
INTRAVENOUS | Status: AC
  Administered 2019-06-27: 7 mg
  Filled 2019-06-27: qty 10

## 2019-06-27 MED ORDER — SODIUM CHLORIDE 0.9 % IV SOLN: INTRAVENOUS | Status: DC | PRN

## 2019-06-27 MED ORDER — STERILE WATER FOR INJECTION IV SOLN
INTRAVENOUS | Status: DC
  Administered 2019-06-27 (×2): via INTRAVENOUS_CENTRAL
  Filled 2019-06-27 (×3): qty 150

## 2019-06-27 MED ORDER — POTASSIUM CHLORIDE 10 MEQ/100ML IV SOLN
10.0000 meq | Freq: Once | INTRAVENOUS | Status: AC
  Administered 2019-06-27: 10 meq via INTRAVENOUS
  Filled 2019-06-27: qty 100

## 2019-06-27 MED ORDER — AMIODARONE HCL IN DEXTROSE 360-4.14 MG/200ML-% IV SOLN
60.0000 mg/h | INTRAVENOUS | Status: DC
  Administered 2019-06-27: 60 mg/h via INTRAVENOUS
  Administered 2019-06-27: 09:00:00 via INTRAVENOUS
  Filled 2019-06-27 (×2): qty 200

## 2019-06-27 MED ORDER — MORPHINE SULFATE (PF) 2 MG/ML IV SOLN
2.0000 mg | INTRAVENOUS | Status: DC | PRN
  Filled 2019-06-27: qty 2

## 2019-06-27 MED ORDER — POTASSIUM CHLORIDE 20 MEQ/15ML (10%) PO SOLN
40.0000 meq | Freq: Two times a day (BID) | ORAL | Status: DC
  Administered 2019-06-27: 40 meq via ORAL
  Filled 2019-06-27: qty 30

## 2019-06-27 MED ORDER — DEXTROSE 5 % IV SOLN
20.0000 g | INTRAVENOUS | Status: DC
  Administered 2019-06-27: 20 g via INTRAVENOUS_CENTRAL
  Filled 2019-06-27: qty 200

## 2019-06-27 MED ORDER — ACETAMINOPHEN 650 MG RE SUPP: 650.0000 mg | Freq: Four times a day (QID) | RECTAL | Status: DC | PRN

## 2019-06-27 MED ORDER — ALTEPLASE 2 MG IJ SOLR
2.0000 mg | Freq: Once | INTRAMUSCULAR | Status: AC
  Administered 2019-06-27: 2 mg
  Filled 2019-06-27: qty 2

## 2019-06-27 MED ORDER — AMIODARONE LOAD VIA INFUSION
150.0000 mg | Freq: Once | INTRAVENOUS | Status: AC
  Administered 2019-06-27: 150 mg via INTRAVENOUS
  Filled 2019-06-27: qty 83.34

## 2019-06-27 MED ORDER — CALCIUM GLUCONATE-NACL 1-0.675 GM/50ML-% IV SOLN
1.0000 g | Freq: Once | INTRAVENOUS | Status: AC
  Administered 2019-06-27: 1000 mg via INTRAVENOUS
  Filled 2019-06-27: qty 50

## 2019-06-27 MED ORDER — DARBEPOETIN ALFA 100 MCG/0.5ML IJ SOSY
100.0000 ug | PREFILLED_SYRINGE | INTRAMUSCULAR | Status: DC
  Filled 2019-06-27: qty 0.5

## 2019-06-27 MED ORDER — ACETAMINOPHEN 325 MG PO TABS: 650.0000 mg | ORAL_TABLET | Freq: Four times a day (QID) | ORAL | Status: DC | PRN

## 2019-06-29 LAB — CULTURE, RESPIRATORY W GRAM STAIN: Special Requests: NORMAL

## 2019-06-30 LAB — POCT I-STAT 7, (LYTES, BLD GAS, ICA,H+H)
Acid-base deficit: 8 mmol/L — ABNORMAL HIGH (ref 0.0–2.0)
Bicarbonate: 19.6 mmol/L — ABNORMAL LOW (ref 20.0–28.0)
Calcium, Ion: 1 mmol/L — ABNORMAL LOW (ref 1.15–1.40)
HCT: 26 % — ABNORMAL LOW (ref 39.0–52.0)
Hemoglobin: 8.8 g/dL — ABNORMAL LOW (ref 13.0–17.0)
O2 Saturation: 88 %
Patient temperature: 36.4
Potassium: 5.1 mmol/L (ref 3.5–5.1)
Sodium: 145 mmol/L (ref 135–145)
TCO2: 21 mmol/L — ABNORMAL LOW (ref 22–32)
pCO2 arterial: 50.1 mmHg — ABNORMAL HIGH (ref 32.0–48.0)
pH, Arterial: 7.197 — CL (ref 7.350–7.450)
pO2, Arterial: 65 mmHg — ABNORMAL LOW (ref 83.0–108.0)

## 2019-07-01 LAB — CULTURE, BLOOD (ROUTINE X 2)
Culture: NO GROWTH
Culture: NO GROWTH

## 2019-07-02 ENCOUNTER — Telehealth: Payer: Self-pay | Admitting: Vascular Surgery

## 2019-07-02 ENCOUNTER — Telehealth: Payer: Self-pay

## 2019-07-02 NOTE — Telephone Encounter (Signed)
°  Received call from [place] [facility], they will be bringing death certificate they need it signed so the body can be cremated. When the original comes in it will need to be signed by Dr Carlis Abbott.

## 2019-07-02 NOTE — Telephone Encounter (Signed)
Received dc from [facility].   DC is for cremation and a patient of Doctor Elsworth Soho.   DC will be taken to [facility] ICU for signature.  Received a phone call from Doctor Elsworth Soho.   The dc will need to go to [facility] with [facility]...   I called the funeral home to let them know this information.

## 2019-07-20 NOTE — Discharge Summary (Signed)
Discharge Summary    Lawrence Wallace Dec 14, 1955 63 y.o. male  [id number]  Admission Date: 06/17/2019  Discharge Date: 07/17/19  Physician: Dr. Carlis Abbott  Admission Diagnosis: AAA (abdominal aortic aneurysm) [facility]) [I71.4]   HPI:   This is a 63 y.o. male  with history of coronary artery disease s/p NSTEMI with dug-eluting stent in 12/2018 in the circumflex, hypertension, hyperlipidemia that presents forfollow-upof abdominal aortic aneurysm after CTA.  As previously noted,he was having hemoptysisearlier this year andunderwent a CT chest by his PCP. Ultimately on this scan got partial imaging of a 5.4 cm abdominal aortic aneurysm. Patient reports no prior knowledge of the aneurysm.   He is on Plavix following his NSTEMI with coronary stent placed in April of this year by Dr. Burt Knack. As far as belly surgery goes he states he did have a cyst removed from his upper abdomen in the past. On follow-up today after CT scan no additional abdominal or back pain.  Hospital Course:  The patient was admitted to the hospital and taken to the operating room on 06/17/2019 and underwent: Open abdominal aortic aneurysm and right common iliac artery aneurysm repair with 18 mm x 9 mm bifurcated Dacron graft    Findings: Large 7+ cm abdominal aortic aneurysm with very short neck required ligation of the left renal vein in order to get adequate exposure for aortic clamp placement on the neck of the aneurysm.  Also had a very large greater than 5 cm right common iliac artery aneurysm.  Ultimately this was repaired with a infrarenal clamp and a bifurcated 18 mm x 9 mm graft sewn from the infrarenal aorta to both common iliac arteries with exclusion of the right common iliac artery aneurysm.  The IMA had no backbleeding and was ligated.   The pt tolerated the procedure well and was transported to the ICU intubated and critical care was consulted for management.   POD 1, pt was requiring pressor  support.  Pt was hemodynamically improving.  BP had been labile overnight.    POD 2, pt still requiring some pressor support.  He was hemodynamically improving.  He did have AKI with rising creatinine.    POD 3, pt remained intubated.  It was reported that he was following some commands the night before for the first time.  AKI likely ATN.  UOP slowly improving.  Shock liver with enzymes improving.  BC and tracheal aspirate pending.  Pt is on Vanc/Zosyn for possible PNA on CXR.    Later that evening, pt seen by Dr. Carlis Abbott with following:  Remains critically ill in the ICU on Levophed and vasopressin.  He had been started empirically on Vanc and Zosyn yesterday with concern for possible underlying pneumonia on chest x-ray while febrile.  He does have gram negative rods growing out of his tracheal aspirate.  Today he was a bit more distended this morning and then had a large emesis during transition to pressure support to try and extubate.  At that time he became profoundly hypotensive and had to go up on his pressors.  In an effort to ensure there are no other underlying etiologies, I did get a CT abdomen pelvis without contrast and I do not see any evidence of pneumatosis, perforation, or free air that would warrant abdominal exploration.  Expected post-op findings on CT with stranding in RP and some fluid - no thickened bowel etc.  I have placed his NG back to suction and stopped his tube feeds given distension this  am.  His CVP is now 4 and I think he still dry.  We will attempt to give him 2 L of LR tonight for further resuscitation and wean pressors.  He will continue his antibiotics and will follow his blood cultures as well as his respiratory cultures.  WBC trending down.  Lactic acid 2.7 from 3.2.  Renal function and liver function slowly recovering.  POD 4, pt remains intubated.  Continuing to try to wean sedation.  Continues to require pressor support. BC with no growth.  Tracheal aspirate reveals E  coli.  He is continued on broad spectrum abx.  He continued to have thrombocytopenia.  He had brisk doppler signals PT and DP bilaterally.  He had duskiness to left middle finger.  His left radial was occluded distally and his left ulnar artery is triphasic.   POD 5, pt remains intubated.   Pressors being weaned.  CT of abd/pelvis with no signs of thickened bowel, pnematosis, or free air over weekend.  No plans for abdominal exploration.  Had normal BM Saturday.  His creatinine did bump.  On POD 6, he did require increasing vent settings.  He is very agitated when sedation is weaned.  Vasopressin weaned off and on Levophed and down to 43mcg.  Platelets continue to improve.  HIT was negative.    Pt did have renal u/s, which revealed normal kidneys and no hydro.  U/a negative for protein.    POD 7, pt remains intubated and dow to levophed 74mcg.  Vent settings decreasing.  TF were started and he is tolerating.  On zosyn for E coli pna.  He did have excellent response to diuresis with 4L UOP.   POD 8, remains intubated.  RN reports he is now following commands.  Levophed was weaned briefly but restarted.  Pt to receive CRRT as he needs improved pulmonary function to be extubated.  He is having leukocytosis and could be related to stress steroids, but new cx obtained.    POD 9, pt had a critical event this morning where he went into A. fib with RVR and became profoundly hypotensive at the same time required higher vent settings and hypoxic.  I consulted cardiology and he was started on amiodarone and is also had increasing pressor requirements as well as increasing vent requirements and now back on 100% FiO2 even in the setting of CRRT to pull off volume the past few days.  Discussed with family that the only other option I can offer would be ex lap to ensure there is no ischemic colon.  Family has decided no additional surgery.  I am frankly not sure he is stable enough for the operating room and now maxed out  on Levophed and neo and still profoundly hypotensive.  I have discussed with him that I think he has a very grim prognosis and this likely nonsurvivable.  We will consult palliative care.  Will maintain the course for now to see if he makes any clinical improvement with maximal medical support.  His condition deteriorated that day and max pressor support.  Family request withdrawal of life support given turn of events.  Pt extubated with comfort support.   Discharge Diagnosis:  AAA (abdominal aortic aneurysm) ([place]) [I71.4]  Pneumonia   Secondary Diagnosis: Patient Active Problem List   Diagnosis Date Noted  . Persistent atrial fibrillation ([place])   . Septic shock ([place])   . S/P AAA repair 06/14/2019  . AAA (abdominal aortic aneurysm) ([place]) 06/04/2019  .  Acute respiratory failure with hypoxia ([place])   . Blood loss anemia   . Encephalopathy acute   . Hypotension   . AAA (abdominal aortic aneurysm) without rupture ([place]) 05/13/2019  . Hemoptysis 05/08/2019  . Hypertension 01/17/2019  . Hyperlipidemia LDL goal <70 01/17/2019  . CAD in native artery 01/17/2019  . NSTEMI (non-ST elevated myocardial infarction) ([place]) 01/15/2019  . Lower extremity pain 03/13/2017  . Chest wall pain 03/01/2016  . GERD (gastroesophageal reflux disease) 05/31/2015  . Encounter for feeding tube placement 05/31/2015  . Tobacco abuse 05/31/2015   Past Medical History:  Diagnosis Date  . Asthma    as a child  . Blood loss anemia   . CAD in native artery    a. cath 12/2018 - total occlusion of the mid Circumflex artery s/p successful PTCA/DES x 1. Moderate non-obstructive disease in the mid RCA and mid LAD.  Marland Kitchen Cancer (HCC)    Skin  . Coronary artery disease   . GERD (gastroesophageal reflux disease)   . Hyperlipidemia LDL goal <70 01/17/2019  . Hypertension 01/17/2019  . NSTEMI (non-ST elevated myocardial infarction) ([place]) 01/15/2019  . Tobacco abuse      Patient's condition:deceased   Leontine Locket, PA-C  [facility] [phone] 07/04/2019  12:52 PM

## 2019-07-20 NOTE — Progress Notes (Signed)
Chaplain provided follow-up care from previous chaplain's visit upon family's request for a chaplain.  Chaplain provided spiritual presence and support.    Please page as needed.

## 2019-07-20 NOTE — Procedures (Signed)
Extubation Procedure Note  Patient Details:   Name: Lawrence Wallace DOB: [birth year] MRN: [id number]   Airway Documentation:    Vent end date: 26-Jul-2019 Vent end time: 1825   Evaluation  O2 sats: currently acceptable Complications: Complications of terminal extubation Patient did tolerate procedure well. Bilateral Breath Sounds: Clear, Diminished   No   Pt was extubated to comfort care measures per MD order and family request. Pt had a cuff leak prior to extubation and was suctioned before hand. Pt now resting comfortably.    Sohil Timko A Maizie Garno 07-26-2019, 6:30 PM

## 2019-07-20 NOTE — Progress Notes (Signed)
Noted pt transition to comfort care.  Have d/c'd all CRRT orders. Will be available as needed.  Madelon Lips MD [facility] pgr [phone]

## 2019-07-20 NOTE — Procedures (Signed)
  Arterial Catheter Insertion Procedure Note Lawrence Wallace [id number] 11-13-1955  Procedure: Insertion of Arterial Catheter  Indications: Blood pressure monitoring and Frequent blood sampling  Procedure Details Consent: Unable to obtain consent because of emergent medical necessity. Time Out: Verified patient identification, verified procedure, site/side was marked, verified correct patient position, special equipment/implants available, medications/allergies/relevent history reviewed, required imaging and test results available.  Performed  Maximum sterile technique was used including antiseptics, cap, gloves, gown, hand hygiene, mask and sheet. Skin prep: Chlorhexidine; local anesthetic administered 20 gauge catheter was inserted into right femoral artery using the Seldinger technique.  Evaluation Blood flow good; BP tracing good. Complications: No apparent complications.   Richardson Landry Minor ACNP Maryanna Shape PCCM Pager [phone] till 3 pm If no answer page [phone] 05-Jul-2019, 10:02 AM

## 2019-07-20 NOTE — Progress Notes (Addendum)
Discussed with renal and vascular Acidosis has been corrected somewhat to 7.2-7.3 range with bicarb, respiratory rate will be increased.  Changes will be made to CRRT per renal. Discussed with vascular-he does have mild lactic acidosis, bowel ischemia and lower extremity ischemia possible.  He will certainly not survive a surgical exploration and I do not think we have enough evidence to subject him to that. If he does stabilize, we can proceed with CT abdomen with IV contrast. Discussed above issues with his wife, no CPR no cardioversion orders issued.  We will continue with organ support measures and see if he will turn around  Additional critical care time x 52mins  Rosaura Bolon V. Elsworth Soho MD  Addendum   - Doing worse, acidotic, hypotensive on max pressors -Family requests withdrawal of life support Orders given , max fent gtt, add versed gtt for comfort , morphine prn -then extubate once comfortable   Noriko Macari V. Elsworth Soho MD  5:19 PM

## 2019-07-20 NOTE — Progress Notes (Signed)
Citrate now ready

## 2019-07-20 NOTE — Progress Notes (Signed)
Wasted Versed 87ml with Lesli Albee  Wasted 131ml Fentanyl with sarah burnham

## 2019-07-20 NOTE — Progress Notes (Signed)
63 year old male remains critically ill in the ICU after open abdominal aortic aneurysm repair.  He had a critical event this morning where he went into A. fib with RVR and became profoundly hypotensive at the same time required higher vent settings and hypoxic.  I consulted cardiology and he was started on amiodarone and is also had increasing pressor requirements as well as increasing vent requirements and now back on 100% FiO2 even in the setting of CRRT to pull off volume the past few days.  Discussed with family that the only other option I can offer would be ex lap to ensure there is no ischemic colon.  Family has decided no additional surgery.  I am frankly not sure he is stable enough for the operating room and now maxed out on Levophed and neo and still profoundly hypotensive.  I have discussed with him that I think he has a very grim prognosis and this likely nonsurvivable.  We will consult palliative care.  Will maintain the course for now to see if he makes any clinical improvement with maximal medical support.  [name], MD [facility] [place] Office: [phone] Pager: [place]

## 2019-07-20 NOTE — Progress Notes (Signed)
[facility] [place]  Subjective  -patient got even more critically ill this morning.  When I went down that he was in A. fib with RVR with a heart rate of 150 and was profoundly hypotensive.  He had a back up on large doses of Levophed and neo-.  Also did increase the vent settings back to 100%.  He was started on CRRT yesterday but that clotted several times.  Objective 102/67 (!) 134 (!) 93.2 F (34 C) 20 98%  Intake/Output Summary (Last 24 hours) at Jul 03, 2019 1231 Last data filed at 07/03/2019 1130 Gross per 24 hour  Intake 6471.48 ml  Output 5000 ml  Net 1471.48 ml   Intubated, sedated, GCS 3 T - nursing reports following commands when sedation weaned Midline incision c/d/i; abdomen is very soft.  There is no rebound.  Movements have been normal with no melena or bloody stools. Femoral pulses are palpable bilaterally Brisk doppler posterior tibial and dorsalis pedis signals bilaterally Left radial occluded distally, left ulnar artery triphasic, duskiness of left middle finger again with increased pressors Both feet appear dusky even though signals are brisk.  Laboratory Lab Results: Recent Labs    06/26/19 0438 07-03-19 0421  July 03, 2019 1014 03-Jul-2019 1159  WBC 28.5* 32.4*  --   --   --   HGB 9.0* 8.9*   < > 9.2* 9.9*  HCT 27.7* 29.0*   < > 27.0* 29.0*  PLT 192 185  --   --   --    < > = values in this interval not displayed.   BMET Recent Labs    06/26/19 1603 07/03/2019 0421  2019/07/03 1014 07-03-19 1159  NA 146* 143   < > 145 145  K 3.1* 2.6*   < > 4.7 4.5  CL 110 114*  --   --   --   CO2 25 19*  --   --   --   GLUCOSE 173* 166*  --   --   --   BUN 94* 52*  --   --   --   CREATININE 2.16* 1.10  --   --   --   CALCIUM 6.5* 5.3*  --   --   --    < > = values in this interval not displayed.    COAG Lab Results  Component Value Date   INR 1.5 (H) 05/22/2019   INR 1.0 06/16/2019   No results found for:  PTT  Assessment/Planning:  Postop day 9 status post open aortic aneurysm repair as well as right common iliac aneurysm repair with bifurcated dacron graft.  Remains intubated in the ICU critically ill.  Neuro: GCS 3 T this morning and intubated and sedated.  Encephalopathy. Very agitated when sedation weaned.  On precedex and fentanyl.   Cardiovascular: Back on pressors.  Neo-at 300 mics and Levophed at 15.  Was following an acute event this morning.  Hgb stable.  Hgb 9.6 --> 9.9 --> 10 --> 8.7 --> 8.8. --> 8.4 --> 8.3 --> 9.0 -->9.9, no indication for transfusion.  Platelets improving.  HIT negative previously.   Pulmonary: Remains intubated.  Back on 100% FiO2 after his event this morning.  Had failed aggressive diuresis and required CRRT.  CRRT has clotted several times.  Back on CRRT at this time. GI: CT abd/pelvis yesterday Saturday, no signs of thickened bowel, pnematosis, or free air over weekend.   Certainly with acute change this morning could have risk of intestinal  ischemia.  Lactic acid is 4.  My concern is that a negative exploration will likely be catastrophic and I am not sure he can even survive the current event. Renal: Right IJ Vas-Cath.  CRRT.  Potassium replaced this morning.  Appreciate nephrology input. Liver: Shock liver, enzymes improved FEN: DH 7.0 after event this morning.  Replace with bicarb. ID: BC no growth to date, tracheal aspirate e coli.  on zosyn, suspect pneumonia, e. Coli pneumonia.  Ankles added yesterday due to rising leukocytosis.  He is remained afebrile.  He is on stress dose steroids which could be contributory.  New tracheal and blood cultures from yesterday with no growth. Continue lovenox daily for DVT prophylaxis.     63 year old male that remains in suspected septic shock following open abdominal aortic aneurysm repair.  Initially this was attributed to E. coli pneumonia.  Unfortunately could not wean the ventilator and he failed aggressive diuresis  and was started on CRRT.  Clinical condition has worsened today now with A. fib with RVR and started him on amiodarone after consulting cardiology.  He was broadened out on antibiotics yesterday after rising WBC in setting of stress steroids.  His pressors have come back up and his lactic acid is now 4.  Certainly possibility of intra-abdominal intestinal ischemia but I am not sure he could survive a laparotomy and certainly a negative laparotomy would be fatal.  Hopefully he will improve with maximum supportive care.  Abdomen remains soft.  His bowel movements have been normal in appearance with no bloody stools.  I have talked to his wife about his very poor prognosis.   [name], MD [facility] [place] Office: [phone] Pager: [phone]   [name] 2019-07-17 12:31 PM --

## 2019-07-20 NOTE — Consult Note (Addendum)
Cardiology Consultation:   Patient ID: Lawrence Wallace; [id number]; 1956-04-06   Admit date: 06/10/2019 Date of Consult: 07/19/19  Primary Care Provider: Pleas Koch, NP Primary Cardiologist: Lawrence Mocha, MD Primary Electrophysiologist:  N/A   Patient Profile:   Lawrence Wallace is a 63 y.o. male with a hx of NSTEMi s/p DESx1 to LCx, Tobacco use, HTN, HLD, and open aortic aneurysm repairs post op day 8 who is being seen today for the evaluation of A.fib w/ RVR at the request of [place].  History of Present Illness:   [name] initially presented to [facility] on 06/15/2019 for abdominal aortic aneurysm repair. He had open repiar of 7cm abdominal aortic aneurysm and 5cm right common iliac artery aneurysm on 05/27/2019. His post-operative course was complicated by hemorrhagic shock with requirement for pressors and multiple units of pRBCs, plasma and platelets.   Hospital course further complicated by sepsis due to E.Coli pneumonia with multi-organ failure. He has been on broad-spectrum abx. He had difficulty being weaned off the ventilator due to worsening AKI, agitation and respiratory failure due to hypervolemia. Nephro was also consulted on 06/24/19 for worsening AKI and CRRT was started on 06/26/19. On evening of 06/26/19, he developed A.fib with RVR and cardiology was consulted.  Past Medical History:  Diagnosis Date   Asthma    as a child   Blood loss anemia    CAD in native artery    a. cath 12/2018 - total occlusion of the mid Circumflex artery s/p successful PTCA/DES x 1. Moderate non-obstructive disease in the mid RCA and mid LAD.   Cancer [facility])    Skin   Coronary artery disease    GERD (gastroesophageal reflux disease)    Hyperlipidemia LDL goal <70 01/17/2019   Hypertension 01/17/2019   NSTEMI (non-ST elevated myocardial infarction) ([place]) 01/15/2019   Tobacco abuse     Past Surgical History:  Procedure Laterality Date   ABDOMINAL AORTIC ANEURYSM REPAIR N/A 06/12/2019   Procedure: REPAIR OF  ABDOMINAL AORTIC ANEURYSM USING 18 X 9MM X 40 CM HEMASHIELD GOLD GRAFT;  Surgeon: Lawrence Heck, MD;  Location: [place];  Service: Vascular;  Laterality: N/A;   CORONARY STENT INTERVENTION N/A 01/16/2019   Procedure: CORONARY STENT INTERVENTION;  Surgeon: Lawrence Blanks, MD;  Location: [place] [facility];  Service: Cardiovascular;  Laterality: N/A;   EYE SURGERY Left 1983   glass remmoved   FOOT FRACTURE SURGERY Right    LEFT HEART CATH AND CORONARY ANGIOGRAPHY N/A 01/16/2019   Procedure: LEFT HEART CATH AND CORONARY ANGIOGRAPHY;  Surgeon: Lawrence Blanks, MD;  Location: [place] [facility];  Service: Cardiovascular;  Laterality: N/A;     Home Medications:  Prior to Admission medications   Medication Sig Start Date End Date Taking? Authorizing Provider  aspirin EC 81 MG tablet Take 1 tablet (81 mg total) by mouth daily. 06/11/19  Yes Weaver, Scott T, PA-C  metoprolol tartrate (LOPRESSOR) 25 MG tablet Take 1 tablet (25 mg total) by mouth 2 (two) times daily. 05/22/19  Yes Lawrence Mocha, MD  Multiple Vitamin (MULTIVITAMIN) tablet Take 1 tablet by mouth daily.   Yes [provider]  nitroGLYCERIN (NITROSTAT) 0.4 MG SL tablet Place 1 tablet (0.4 mg total) under the tongue every 5 (five) minutes x 3 doses as needed for chest pain. 05/22/19  Yes Lawrence Mocha, MD  rosuvastatin (CRESTOR) 40 MG tablet Take 1 tablet (40 mg total) by mouth daily. Patient taking differently: Take 40 mg  by mouth daily at 6 PM.  05/22/19  Yes Lawrence Mocha, MD  varenicline (CHANTIX STARTING MONTH PAK) 0.5 MG X 11 & 1 MG X 42 tablet Take one 0.5 mg tablet by mouth once daily for 3 days, then increase to one 0.5 mg tablet twice daily for 4 days, then increase to one 1 mg tablet twice daily. Patient taking differently: Take 1 mg by mouth 2 (two) times daily. Not taking 05/08/19  Yes Lawrence Koch, NP  clopidogrel (PLAVIX) 75 MG tablet Take 1 tablet (75 mg total) by mouth  daily. 05/30/19   Lawrence Kail, PA    Inpatient Medications: Scheduled Meds:  sodium chloride   Intravenous Once   chlorhexidine gluconate (MEDLINE KIT)  15 mL Mouth Rinse BID   Chlorhexidine Gluconate Cloth  6 each Topical Daily   darbepoetin (ARANESP) injection - NON-DIALYSIS  100 mcg Subcutaneous Q Fri-1800   enoxaparin (LOVENOX) injection  30 mg Subcutaneous Q24H   feeding supplement (PRO-STAT SUGAR FREE 64)  60 mL Per Tube BID   free water  150 mL Per Tube Q8H   hydrocortisone sodium succinate  50 mg Intravenous Q6H   insulin aspart  0-9 Units Subcutaneous Q4H   mouth rinse  15 mL Mouth Rinse 10 times per day   metoprolol tartrate       pantoprazole (PROTONIX) IV  40 mg Intravenous QHS   potassium chloride  40 mEq Oral BID   QUEtiapine  25 mg Oral BID   sodium chloride flush  10-40 mL Intracatheter Q12H   sodium chloride flush  10-40 mL Intracatheter Q12H   Continuous Infusions:   prismasol BGK 4/2.5 400 mL/hr at 06/29/19 0207    prismasol BGK 4/2.5 300 mL/hr at 06/26/19 1415   sodium chloride Stopped (06/24/19 2020)   sodium chloride     calcium gluconate     dexmedetomidine (PRECEDEX) IV infusion 1.2 mcg/kg/hr (06/29/2019 0504)   feeding supplement (VITAL 1.5 CAL) 1,000 mL (06/29/19 0655)   fentaNYL infusion INTRAVENOUS 150 mcg/hr (29-Jun-2019 0652)   heparin 10,000 units/ 20 mL infusion syringe 900 Units/hr (29-Jun-2019 0809)   magnesium sulfate bolus IVPB     norepinephrine (LEVOPHED) Adult infusion Stopped (06/26/19 1627)   phenylephrine (NEO-SYNEPHRINE) Adult infusion 90 mcg/min (06/29/19 0530)   piperacillin-tazobactam Stopped (06-29-19 0434)   potassium chloride 10 mEq (06/29/19 0752)   prismasol BGK 4/2.5 1,800 mL/hr at 06-29-19 0631   vancomycin     vasopressin (PITRESSIN) infusion - *FOR SHOCK* Stopped (06/22/19 1113)   PRN Meds: Place/Maintain arterial line **AND** sodium chloride, Place/Maintain arterial line **AND** sodium  chloride, acetaminophen (TYLENOL) oral liquid 160 mg/5 mL, bisacodyl, fentaNYL, fentaNYL (SUBLIMAZE) injection, heparin, heparin, hydrALAZINE, labetalol, magnesium sulfate bolus IVPB, midazolam, ondansetron, phenol, sodium chloride flush, sodium chloride flush  Allergies:   No Known Allergies  Social History:   Social History   Socioeconomic History   Marital status: Married    Spouse name: Not on file   Number of children: Not on file   Years of education: Not on file   Highest education level: Not on file  Occupational History   Not on file  Social Needs   Financial resource strain: Not on file   Food insecurity    Worry: Not on file    Inability: Not on file   Transportation needs    Medical: Not on file    Non-medical: Not on file  Tobacco Use   Smoking status: Current Every Day Smoker    Packs/day:  0.75    Years: 45.00    Pack years: 33.75    Types: Cigarettes   Smokeless tobacco: Never Used  Substance and Sexual Activity   Alcohol use: Yes    Alcohol/week: 6.0 standard drinks    Types: 6 Shots of liquor per week    Comment: 1 mixed liquor every night   Drug use: Never   Sexual activity: Not on file  Lifestyle   Physical activity    Days per week: Not on file    Minutes per session: Not on file   Stress: Not on file  Relationships   Social connections    Talks on phone: Not on file    Gets together: Not on file    Attends religious service: Not on file    Active member of club or organization: Not on file    Attends meetings of clubs or organizations: Not on file    Relationship status: Not on file   Intimate partner violence    Fear of current or ex partner: Not on file    Emotionally abused: Not on file    Physically abused: Not on file    Forced sexual activity: Not on file  Other Topics Concern   Not on file  Social History Narrative   Not on file    Family History:    Family History  Problem Relation Age of Onset   Stroke  Mother    Cancer Mother    Cancer Father      ROS:  Please see the history of present illness.   Unable to obtain due to intubation  Physical Exam/Data:   Vitals:   07-01-2019 0600 07-01-19 0700 01-Jul-2019 0742 Jul 01, 2019 0800  BP: (!) 80/66 (!) 55/42 (!) 85/45 93/72  Pulse:  (!) 25 (!) 144   Resp: 14 19 (!) 21 20  Temp:      TempSrc:      SpO2:  (!) 88% 90%   Weight:      Height:        Intake/Output Summary (Last 24 hours) at 07/01/2019 0830 Last data filed at 07/01/2019 0700 Gross per 24 hour  Intake 6110.61 ml  Output 5190 ml  Net 920.61 ml   Filed Weights   06/25/19 0328 06/26/19 0417 July 01, 2019 0500  Weight: 103.3 kg 103 kg 100.9 kg   Body mass index is 28.56 kg/m.   Gen: Well-developed, toxic appearing  HEENT: NCAT head, EOMI, ETT and NG tube in place and functioning Neck: central line in place CV: irregularly irregular, +rub Pulm: Coarse breath sounds worse on L. Abd: Soft, Surgical site w/o significant surrounding erythema, edema, no bleed ordrainage Extm: Dusky appearing extremities Skin: Dry, Cool  EKG:  No uploaded EKG this admission  Telemetry:  Telemetry was personally reviewed and demonstrates:  Normal sinus but went into A.fib with RVR with HR around 160s on 11am on 06/26/19. With intervention, slowly brady down to 40s. Around 6am this morning went back into A.fib w/ RVR w/ HR around 150  Relevant CV Studies: 06/19/19 TTE IMPRESSIONS  1. Left ventricular ejection fraction, by visual estimation, is 60 to 65%. The left ventricle has normal function. Normal left ventricular size. There is no left ventricular hypertrophy.  2. Not well visualized with definity EF appears normal with small LV cavity size.  3. Global right ventricle has normal systolic function.The right ventricular size is normal. No increase in right ventricular wall thickness.  4. Left atrial size was normal.  5.  Right atrial size was normal.  6. The mitral valve is normal in structure.  No evidence of mitral valve regurgitation. No evidence of mitral stenosis.  7. The tricuspid valve is normal in structure. Tricuspid valve regurgitation is mild.  8. The aortic valve is tricuspid Aortic valve regurgitation was not visualized by color flow Doppler. Structurally normal aortic valve, with no evidence of sclerosis or stenosis.  9. The pulmonic valve was not well visualized. Pulmonic valve regurgitation was not assessed by color flow Doppler. 10. The inferior vena cava is normal in size with greater than 50% respiratory variability, suggesting right atrial pressure of 3 mmHg. 11. The interatrial septum was not well visualized.  Laboratory Data:  Chemistry Recent Labs  Lab 06/26/19 0438 06/26/19 1603 07-02-2019 0421  NA 149* 146* 143  K 3.3* 3.1* 2.6*  CL 109 110 114*  CO2 23 25 19*  GLUCOSE 179* 173* 166*  BUN 114* 94* 52*  CREATININE 3.06* 2.16* 1.10  CALCIUM 6.9* 6.5* 5.3*  GFRNONAA 20* 31* >60  GFRAA 24* 36* >60  ANIONGAP 17* 11 10    Recent Labs  Lab 06/21/19 0315 06/22/19 0311 06/25/19 0352 06/26/19 1603 Jul 02, 2019 0421  PROT 4.4* 4.4* 5.1*  --   --   ALBUMIN 1.8* 1.8* 1.4* 1.3* 1.2*  AST 412* 160* 299*  --   --   ALT 268* 154* 135*  --   --   ALKPHOS 43 53 63  --   --   BILITOT 2.8* 4.0* 2.9*  --   --    Hematology Recent Labs  Lab 06/25/19 0352 06/26/19 0438 07-02-2019 0421  WBC 16.2* 28.5* 32.4*  RBC 2.69* 2.94* 2.92*  HGB 8.3* 9.0* 8.9*  HCT 25.6* 27.7* 29.0*  MCV 95.2 94.2 99.3  MCH 30.9 30.6 30.5  MCHC 32.4 32.5 30.7  RDW 15.2 15.4 15.9*  PLT 143* 192 185   Cardiac EnzymesNo results for input(s): TROPONINI in the last 168 hours. No results for input(s): TROPIPOC in the last 168 hours.  BNPNo results for input(s): BNP, PROBNP in the last 168 hours.  DDimer No results for input(s): DDIMER in the last 168 hours.  Radiology/Studies:  Dg Chest 1 View  Result Date: 06/26/2019 CLINICAL DATA:  Central line placement EXAM: CHEST  1 VIEW  COMPARISON:  06/26/2019 FINDINGS: Endotracheal tube terminates approximately 3.7 cm superior to the carina. Enteric tube courses below the diaphragm with distal tip and side hole beyond the inferior margin of the film. Right IJ central venous catheter terminates in the SVC. A right-sided PICC line terminates in the distal SVC. Stable cardiomediastinal contours. Persistent bibasilar opacities, left greater than right. No pneumothorax. IMPRESSION: 1. Persistent bibasilar opacities. 2. Support lines and tubes, as above.  No pneumothorax. Electronically Signed   By: Davina Poke M.D.   On: 06/26/2019 11:08   Dg Abd 1 View  Result Date: 06/24/2019 CLINICAL DATA:  Feeding tube placement. EXAM: ABDOMEN - 1 VIEW COMPARISON:  06/21/2019 FINDINGS: Enteric tube extends through the stomach, tip projecting in the expected location of the 2nd to 3rd portion of the duodenum. Nasogastric tube tip lies in the mid stomach stable from the prior study. Loop of mildly dilated small bowel is noted in the left mid abdomen. IMPRESSION: 1. Enteric feeding tube tip projects at the junction of the 2nd to 3rd portion of the duodenum. Electronically Signed   By: Lajean Manes M.D.   On: 06/24/2019 12:26   Dg Chest [facility] 1 View  Result  Date: 06/26/2019 CLINICAL DATA:  Respiratory failure EXAM: PORTABLE CHEST 1 VIEW COMPARISON:  Yesterday FINDINGS: Endotracheal tube tip at the clavicular heads. Orogastric tubes reaches the stomach. Right IJ sheath in place. Right PICC with tip at the SVC. Hazy opacification at the left base, stable. Generalized interstitial coarsening with emphysematous appearance. Small left pleural effusion is likely. No pneumothorax. IMPRESSION: 1. Unremarkable hardware positioning. 2. Stable atelectasis or pneumonia at the left base. 3. COPD Electronically Signed   By: Monte Fantasia M.D.   On: 06/26/2019 09:03   Dg Chest Port 1 View  Result Date: 06/25/2019 CLINICAL DATA:  Respiratory failure. EXAM: PORTABLE  CHEST 1 VIEW COMPARISON:  Radiograph of June 24, 2019. FINDINGS: Stable cardiomediastinal silhouette. Endotracheal and feeding tubes are unchanged in position. Right-sided PICC line is unchanged in position. Right internal jugular sheath is unchanged. Minimal right basilar subsegmental atelectasis is noted. Stable left basilar atelectasis or infiltrate is noted with possible associated effusion. Bony thorax is unremarkable. IMPRESSION: Stable left basilar opacity is noted concerning for atelectasis or infiltrate with possible effusion. Stable support apparatus. Aortic Atherosclerosis (ICD10-I70.0). Electronically Signed   By: Marijo Conception M.D.   On: 06/25/2019 08:12   Dg Chest Port 1 View  Result Date: 06/24/2019 CLINICAL DATA:  63 year old male intubated, status post coronary catheterization. EXAM: PORTABLE CHEST 1 VIEW COMPARISON:  06/23/2019 and earlier. FINDINGS: Portable AP semi upright view at 0506 hours. New tracheal tube appears stable with tip between the level the clavicles and carina. Enteric tube courses to the abdomen, tip not included. Stable right IJ introducer sheath. A right upper extremity approach PICC line has been placed, tip at the lower SVC level. Mildly improved lung volumes. Stable pulmonary vascularity, no edema. No pneumothorax. Mildly improved ventilation at the left lung base with increased visualization of the left hemidiaphragm. Residual veiling and confluent opacity. Normal cardiac size and mediastinal contours. Calcified aortic atherosclerosis. IMPRESSION: 1. Right PICC line placed.  Otherwise stable lines and tubes. 2. Improved but not normalized ventilation at the left lung base. 3. No new cardiopulmonary abnormality. Electronically Signed   By: Genevie Ann M.D.   On: 06/24/2019 07:53    Assessment and Plan:   A.fib w/ RVR Septic Shock Consulted for A.fib w/ RVR. Initial event yesterday am. Controlled w/ metoprolol overnight but now back in A.fib w/ RVR. Likely etiology  due to septic shock & hypokalemia. Likely source pneumonia. Wbc 16.2->28.5->32.4. Procalc >5 Still hypotensive requiring pressors. K this am down to 2.6. Mag 1.7. Will likely resolve with resolution of underlying issues. Started on metoprolol per primary team - TTE - D/c metorpolol - Start amio bolus + gtt - Will need to fix underlying issue - C/w broad spectrum abx (currently vanc, zosyn) - Pressor/Vent management per PCCM  Acute Renal Failure On CRRT per nephro. K 2.6, Creatinine 1.1. Still making urineAbout 3L removed overnight - Replete K/Mag - Crrt per nephro  CAD Hx of NSTEMI LHC done on 01/16/19 wl mid circumflex artery occlusion s/o DES x1. LV systolic function intact w/o wall motion abnormalities - Holding DAPT in setting of acute illness w/ anemia and post-operative bleed.  For questions or updates, please contact [place] Please consult [email or web address] for contact info under Cardiology/STEMI.   Signed, Gilberto Better, MD PGY-2, [facility] Pager: [phone] 2019-07-03 8:30 AM  Attending attestation to follow

## 2019-07-20 NOTE — Progress Notes (Addendum)
NAME:  CATALINO GURRY, MRN:  [id number], DOB:  10/21/1955, LOS: 9 ADMISSION DATE:  06/05/2019, CONSULTATION DATE:  05/21/2019 REFERRING MD:  Carlis Abbott  CHIEF COMPLAINT:  Vent Management   Brief History   Lawrence Wallace is a 63 y.o. male who was admitted 9/30 for repair of large AAA.  He had significant intraoperative blood loss with subsequent hypotension requiring 5L IVF's, 2u PRBC, 2 FFP, 2.5L from cellsaver, and neosynephrine.  Upon leaving PACU, he was somewhat hypertensive and was started on cleviprex.   Course complicated by AKI requiring CRRT, gram-negative H CAP    Past Medical History  tobacco, abuse, AAA, NSTEMI, HTN, HLD, CAD, skin CA, GERD.  Significant Hospital Events   9/30 > admitted for OR repair of 7cm AAA and 5cm R common iliac artery aneurysm. 10/1-10/2 > Difficult to wean off sedation or vent due to agitation. Started on seroquel 10/3 > Tolerating PS  10/8 worsening hypoxia requiring reintubation,   Consults:  PCCM.  Procedures:  ETT 9/30 >  reintubated 10/8 for position >> L rad art line 9/30 > out R swan 9/30 > [place] DC'd 06/26/2019 Right IJ HD catheter 10/8 >> Rt fem a line 10/9 >>   Significant Diagnostic Tests:  TTE 06/19/19 - EF 60-65% No WMA or valvular abnormalities  Micro Data:  Blood 10/3>>>NTD Urine 10/3>>>NTD Sputum 10/3>>>E coli resistant to amp and unasyn, sensitive to zosyn 06/26/2019 sputum>> 06/26/2019 blood culture>> Antimicrobials:  Vanc 10/3>>>10/5 restarted on 06/26/2019>> Zosyn 10/3>>>  Interim history/subjective:   Unable to obtain good saturations, FiO2 increased to 100%. Remains in A. fib RVR CRRT clotted and was stopped, low blood pressure requiring increased pressors Remains critically ill, intubated    Objective:  Blood pressure 117/89, pulse 81, temperature (!) 97 F (36.1 C), temperature source Axillary, resp. rate 18, height 6\' 2"  (1.88 m), weight 100.9 kg, SpO2 (!) 84 %. CVP:  [5 mmHg-12 mmHg] 5 mmHg  Vent Mode: PRVC FiO2  (%):  [60 %-100 %] 100 % Set Rate:  [20 bmp] 20 bmp Vt Set:  [650 mL] 650 mL PEEP:  [8 cmH20] 8 cmH20 Plateau Pressure:  [20 cmH20-24 cmH20] 23 cmH20   Intake/Output Summary (Last 24 hours) at 2019-07-27 0903 Last data filed at July 27, 2019 0700 Gross per 24 hour  Intake 6002.47 ml  Output 5080 ml  Net 922.47 ml   Filed Weights   06/25/19 0328 06/26/19 0417 07/27/2019 0500  Weight: 103.3 kg 103 kg 100.9 kg   Physical Exam: General: Critically ill-appearing, intubated, off sedation, appears in moderate distress HEENT: Mild icterus and pallor, oral ETT Neuro: Unresponsive, eyes open, does not follow commands CV: S1-S2 irregular PULM: Creased breath sounds bilateral with friction rubs on both axilla?  Due to chest wall retraction GI: soft, incision appears clean Extremities: warm/dry, 2+ edema  Skin: no rashes or lesions  Chest x-ray 10/9 personally reviewed which shows hardware in position including ET tube and bilateral lower lobe atelectasis and effusions  Labs show decreased creatinine on CRRT, hypokalemia  Assessment & Plan:  63 year old male with AAA and bilateral common iliac aneurysms s/p elective open AAA repair , hemorrhagic shock with AKI  Hemorrhagic Shock - resolved Septic shock  Continue Levophed and Neo-Synephrine Obtain arterial line to define accurate blood pressure Repeat bedside limited echo to rule out pericardial effusion, less likely Continue stress dose steroids Obtain lactate   Acute respiratory failure  Wean FiO2 as tolerated Not a candidate for weaning at this time  AKI, non -oliguric. Likely ATN in setting of shock Hypokalemia Severe metabolic acidosis Replete electrolytes as needed CRRT per renal Optimistic for renal recovery Bicarb 2 A and start drip,  Atrial fibrillation/RVR-on amnio drip, cardiology following   Acute metabolic encephalopathy Fentanyl gtt Dc Precedex due to hypotension this morning On Seroquel 25 twice daily   Incidental AAA s/p open repair  7.4cm infrarenal aneurysm Per vascular , monitoring both lower extremity ischemia with Dopplers  E. Coli HCAP - ct zosyn   Transaminitis/shock liver -Monitor LFTs intermittently Restart low-dose tube feeds if no concern for bowel ischemia   Acute blood loss anemia Post-op thrombocytopenia>> resolving   Continue to monitor As per protocol   Hx HTN, CAD, NSTEMI Continue to hold home Lopressor Plavix and aspirin statin     Discussion Severe acidosis and worsening hypotension this morning, will check stat lactate if this is elevated then I would be concerned about ischemic organ?  Extremity versus bowel If lactate is normal, then will ask renal to adjust CRRT settings  The patient is critically ill with multiple organ systems failure and requires high complexity decision making for assessment and support, frequent evaluation and titration of therapies, application of advanced monitoring technologies and extensive interpretation of multiple databases. Critical Care Time devoted to patient care services described in this note independent of APP/resident  time is 45 minutes.    Kara Mead MD. [place] Flood. [place] Pager [phone] If no response call [phone]   07/20/2019

## 2019-07-20 NOTE — Progress Notes (Signed)
During report, pt's O2 sat alarms ringing out because it cannot read O2 sats. Pt in Rapid afib rates in 140's. Per report, ever since bath, pt's rhythm changed to rapid afib.  Despite changing probe to patients ear, it was still unable to be read, changed it to his forehead, still unreadable. O2 breath given and respiratory called to bedside. Pt's oral  temp 84 degrees. Bare hugger applied. CRRT clotted off so it was stopped and blood was NOT returned. CCM called to bedside, Dr Carlis Abbott at bedside. Per verbal order, 2.5 metoprolol given for rapid afib. RT attempting radial aline at this time. Re-priming CRRT machine at this time as well. Will restart CRRT as soon as possible.  Lucius Conn, RN

## 2019-07-20 NOTE — Progress Notes (Signed)
Visited patient in response to page from RN. Provided prayer, spiritual comfort, and ministry of presence to the family and patient. Will remain available for spiritual care as needed.   Rev. [place].

## 2019-07-20 NOTE — Progress Notes (Signed)
Attempted to restart patient on CRRT. Patients MAP in 30's. CRRT machine will not run.

## 2019-07-20 NOTE — Progress Notes (Signed)
Subjective:  Initiated CRRT yesterday for volume and azotemia- filter clotted so needed heparin- still with much pressor need- making lots of urine- seems that lasix and zarox have been stopped-  K is low this AM Objective Vital signs in last 24 hours: Vitals:   July 10, 2019 0353 07/10/2019 0400 10-Jul-2019 0422 07/10/19 0500  BP: (!) 71/51 98/60  121/64  Pulse: (!) 32 (!) 33  (!) 31  Resp: 20 20  20   Temp:   (!) 97 F (36.1 C)   TempSrc:   Axillary   SpO2: 99% 95%  97%  Weight:      Height:       Weight change:   Intake/Output Summary (Last 24 hours) at 07-10-2019 0616 Last data filed at 07-10-19 0500 Gross per 24 hour  Intake 7186.06 ml  Output 5049 ml  Net 2137.06 ml    Assessment/ Plan: Pt is a 63 y.o. yo male who was admitted on 05/22/2019 with  AAA repair done 4/74- post op complications of hypotension req pressors- hypovolemia and sepsis, now resp failure and now non oliguric AKI  Assessment/Plan: 1. Renal-  Non noliguric AKI in the setting of AAA repair with complicated post op course.  CRRT initiated 10/8 via temp cath.  Machine running well since heparin added- all 4 K dialysate- cont CRRT given hemodynamic instability.  Normal renal function at baseline  2. HTN/vol-  Overloaded - req FIo2 of 60% .  Making some urine without diuretics-  Removing 50 per hour with CRRT  3. Elytes-  k low-  Being repleted both IV and by tube- will inc dosing by tube.  Corrected calcium is actually around 7.7- got dose of that this AM as well - mag 1.7  Sodium had been high - is on free water per tube, in normal range this AM 4. Anemia - fairly stable in the 8's-  Supportive care but will also give a dose of ESA  5. AAA- per VVS 6. Sepsis- on vanc/zosyn per CCM- WBC climbing  Louis Meckel    Labs: Basic Metabolic Panel: Recent Labs  Lab 06/26/19 0438 06/26/19 1603 Jul 10, 2019 0421  NA 149* 146* 143  K 3.3* 3.1* 2.6*  CL 109 110 114*  CO2 23 25 19*  GLUCOSE 179* 173* 166*  BUN 114*  94* 52*  CREATININE 3.06* 2.16* 1.10  CALCIUM 6.9* 6.5* 5.3*  PHOS 5.1* 4.3 2.7   Liver Function Tests: Recent Labs  Lab 06/21/19 0315 06/22/19 0311 06/25/19 0352 06/26/19 1603 Jul 10, 2019 0421  AST 412* 160* 299*  --   --   ALT 268* 154* 135*  --   --   ALKPHOS 43 53 63  --   --   BILITOT 2.8* 4.0* 2.9*  --   --   PROT 4.4* 4.4* 5.1*  --   --   ALBUMIN 1.8* 1.8* 1.4* 1.3* 1.2*   Recent Labs  Lab 06/22/19 0311  LIPASE 57*   No results for input(s): AMMONIA in the last 168 hours. CBC: Recent Labs  Lab 06/22/19 0311 06/23/19 0339  06/24/19 0428  06/25/19 0352 06/26/19 0438 2019/07/10 0421  WBC 11.1* 10.0  --  15.1*  --  16.2* 28.5* 32.4*  NEUTROABS 9.0*  --   --   --   --   --  26.3* 29.6*  HGB 8.7* 8.8*   < > 8.4*   < > 8.3* 9.0* 8.9*  HCT 27.5* 25.8*   < > 25.7*   < > 25.6*  27.7* 29.0*  MCV 96.5 93.1  --  95.5  --  95.2 94.2 99.3  PLT 68* 75*  --  119*  --  143* 192 185   < > = values in this interval not displayed.   Cardiac Enzymes: No results for input(s): CKTOTAL, CKMB, CKMBINDEX, TROPONINI in the last 168 hours. CBG: Recent Labs  Lab 06/26/19 1131 06/26/19 1612 06/26/19 2016 06/26/19 2331 26-Jul-2019 0415  GLUCAP 175* 152* 135* 145* 147*    Iron Studies: No results for input(s): IRON, TIBC, TRANSFERRIN, FERRITIN in the last 72 hours. Studies/Results: Dg Chest 1 View  Result Date: 06/26/2019 CLINICAL DATA:  Central line placement EXAM: CHEST  1 VIEW COMPARISON:  06/26/2019 FINDINGS: Endotracheal tube terminates approximately 3.7 cm superior to the carina. Enteric tube courses below the diaphragm with distal tip and side hole beyond the inferior margin of the film. Right IJ central venous catheter terminates in the SVC. A right-sided PICC line terminates in the distal SVC. Stable cardiomediastinal contours. Persistent bibasilar opacities, left greater than right. No pneumothorax. IMPRESSION: 1. Persistent bibasilar opacities. 2. Support lines and tubes, as  above.  No pneumothorax. Electronically Signed   By: Davina Poke M.D.   On: 06/26/2019 11:08   Dg Chest Port 1 View  Result Date: 06/26/2019 CLINICAL DATA:  Respiratory failure EXAM: PORTABLE CHEST 1 VIEW COMPARISON:  Yesterday FINDINGS: Endotracheal tube tip at the clavicular heads. Orogastric tubes reaches the stomach. Right IJ sheath in place. Right PICC with tip at the SVC. Hazy opacification at the left base, stable. Generalized interstitial coarsening with emphysematous appearance. Small left pleural effusion is likely. No pneumothorax. IMPRESSION: 1. Unremarkable hardware positioning. 2. Stable atelectasis or pneumonia at the left base. 3. COPD Electronically Signed   By: Monte Fantasia M.D.   On: 06/26/2019 09:03   Medications: Infusions: .  prismasol BGK 4/2.5 400 mL/hr at 07/26/19 0207  .  prismasol BGK 4/2.5 300 mL/hr at 06/26/19 1415  . sodium chloride Stopped (06/24/19 2020)  . calcium gluconate    . calcium gluconate    . dexmedetomidine (PRECEDEX) IV infusion 1.2 mcg/kg/hr (07-26-19 0504)  . feeding supplement (VITAL 1.5 CAL) 1,000 mL (06/26/19 0409)  . fentaNYL infusion INTRAVENOUS 200 mcg/hr (Jul 26, 2019 0500)  . heparin 10,000 units/ 20 mL infusion syringe 900 Units/hr (07/26/19 0410)  . magnesium sulfate bolus IVPB    . norepinephrine (LEVOPHED) Adult infusion Stopped (06/26/19 1627)  . phenylephrine (NEO-SYNEPHRINE) Adult infusion 90 mcg/min (July 26, 2019 0530)  . piperacillin-tazobactam Stopped (26-Jul-2019 0434)  . potassium chloride    . prismasol BGK 4/2.5 1,800 mL/hr at July 26, 2019 0341  . vancomycin    . vasopressin (PITRESSIN) infusion - *FOR SHOCK* Stopped (06/22/19 1113)    Scheduled Medications: . sodium chloride   Intravenous Once  . chlorhexidine gluconate (MEDLINE KIT)  15 mL Mouth Rinse BID  . Chlorhexidine Gluconate Cloth  6 each Topical Daily  . docusate  100 mg Oral Daily  . enoxaparin (LOVENOX) injection  30 mg Subcutaneous Q24H  . feeding supplement  (PRO-STAT SUGAR FREE 64)  60 mL Per Tube BID  . free water  150 mL Per Tube Q8H  . hydrocortisone sodium succinate  50 mg Intravenous Q6H  . insulin aspart  0-9 Units Subcutaneous Q4H  . mouth rinse  15 mL Mouth Rinse 10 times per day  . metoprolol tartrate  25 mg Oral BID  . pantoprazole (PROTONIX) IV  40 mg Intravenous QHS  . QUEtiapine  25 mg Oral BID  .  sodium chloride flush  10-40 mL Intracatheter Q12H  . sodium chloride flush  10-40 mL Intracatheter Q12H    have reviewed scheduled and prn medications.  Physical Exam: General: alert, agitated on vent Heart: now low ? Lungs: CBS bilat  Abdomen: midline incision-  tender Extremities: mottled - trace to 1+ edema Dialysis Access: right IJ vascath placed 10/8    07-25-19,6:16 AM  LOS: 9 days

## 2019-07-20 DEATH — deceased

## 2019-09-08 ENCOUNTER — Ambulatory Visit: Payer: Medicare Other | Admitting: Physician Assistant

## 2020-02-17 DEATH — deceased

## 2021-04-17 IMAGING — CT CT CTA ABD/PEL W/CM AND/OR W/O CM
1 of 4 series · 12 of 32 positions shown, 17 images · IV contrast (APPLIED)
Comparison: CT of the chest on 05/08/2019

CLINICAL DATA: Abdominal aortic aneurysm by CT of the chest which
was incompletely visualized and measured at least 5.4 cm.

EXAM:
CT ANGIOGRAPHY ABDOMEN AND PELVIS WITH CONTRAST AND WITHOUT CONTRAST
TECHNIQUE: Multidetector CT imaging of the abdomen and pelvis was performed
using the standard protocol during bolus administration of
intravenous contrast. Multiplanar reconstructed images and MIPs were
obtained and reviewed to evaluate the vascular anatomy.
CONTRAST:  75mL [id number] IOPAMIDOL ([id number]) INJECTION 76%

[Series 5: pre stent angio · axial · non-contrast · 0.89mm/px · z∈[-506,-83]mm · 12 of 477 slices shown, 17 images]
[im 27/477  soft-tissue]
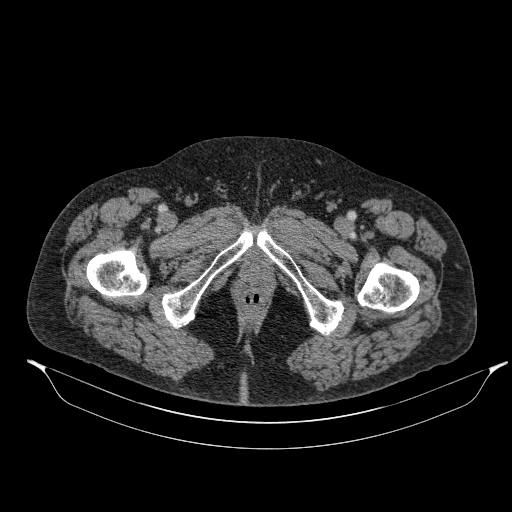
[im 27/477  bone]
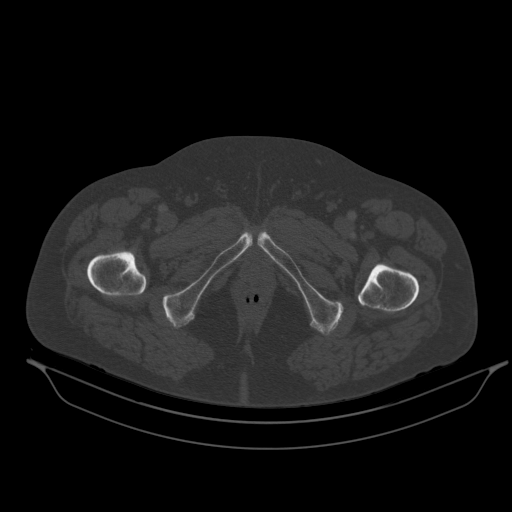
[im 80/477  soft-tissue]
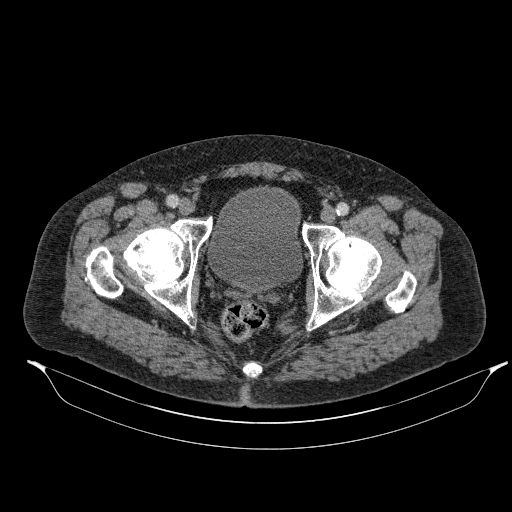
[im 106/477  soft-tissue]
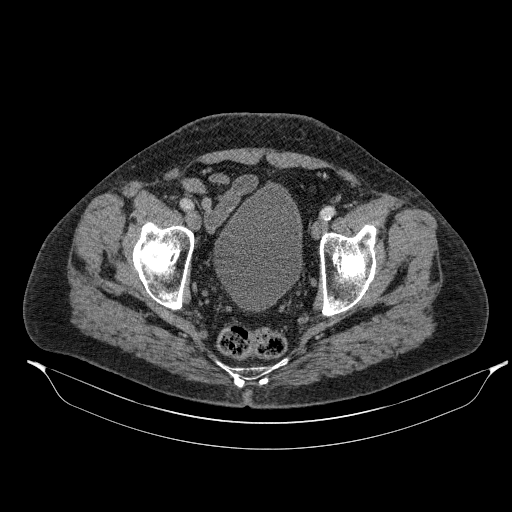
[im 159/477  soft-tissue]
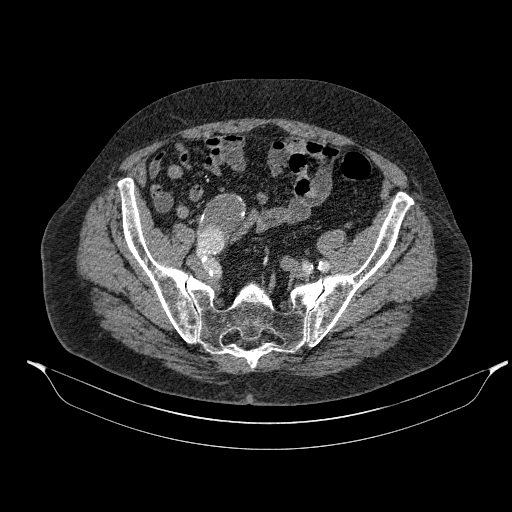
[im 186/477  soft-tissue]
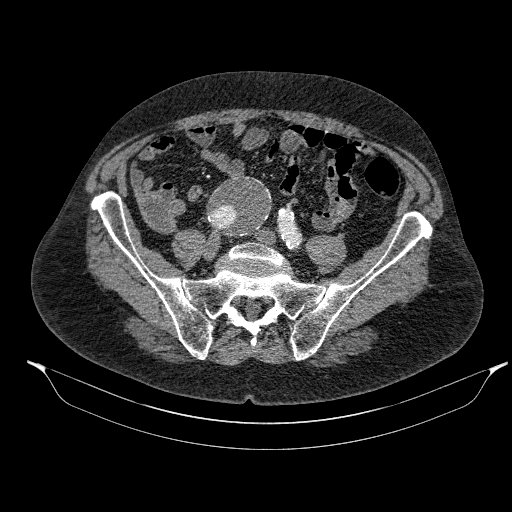
[im 239/477  soft-tissue]
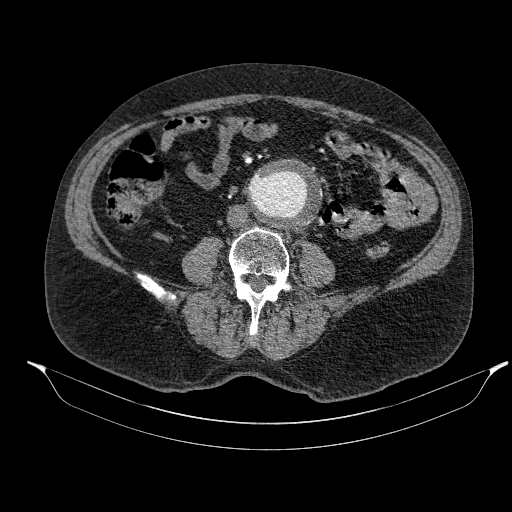
[im 291/477  soft-tissue]
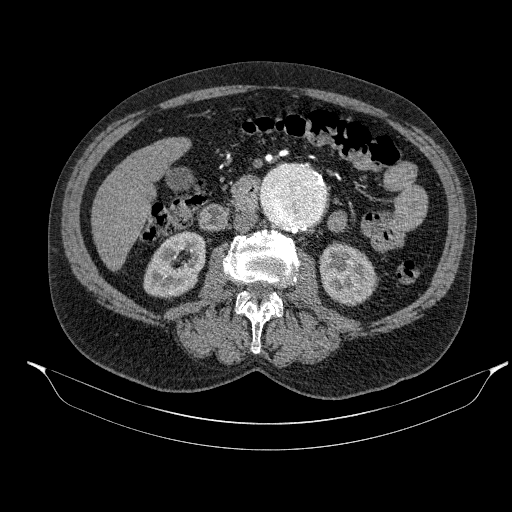
[im 318/477  soft-tissue]
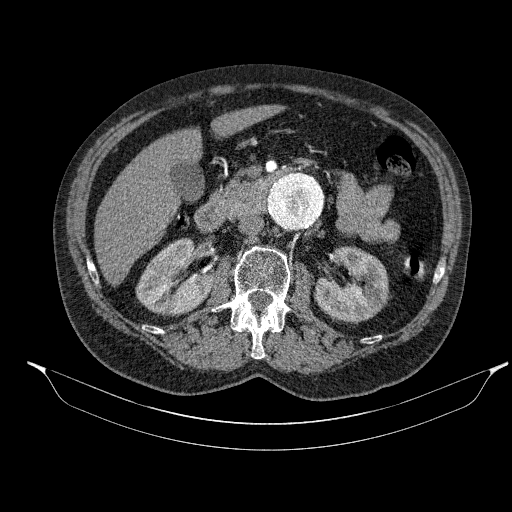
[im 371/477  soft-tissue]
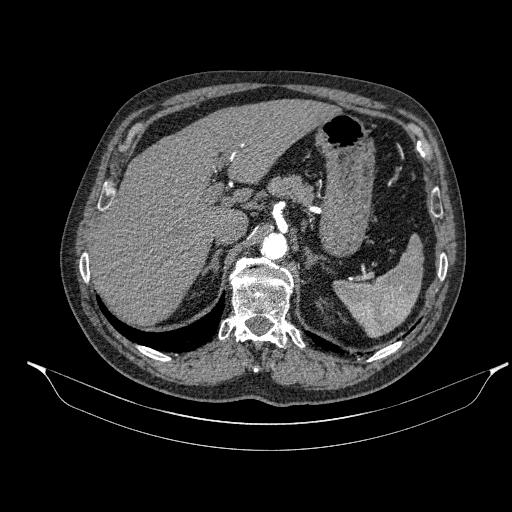
[im 371/477  lung]
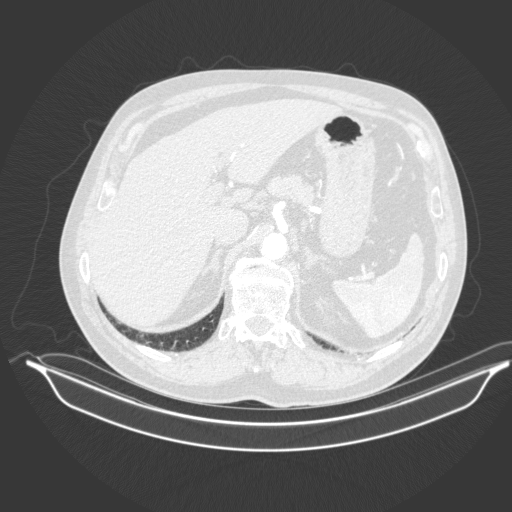
[im 371/477  bone]
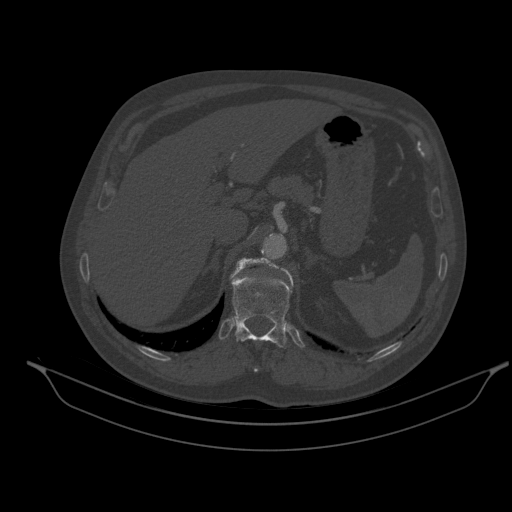
[im 397/477  soft-tissue]
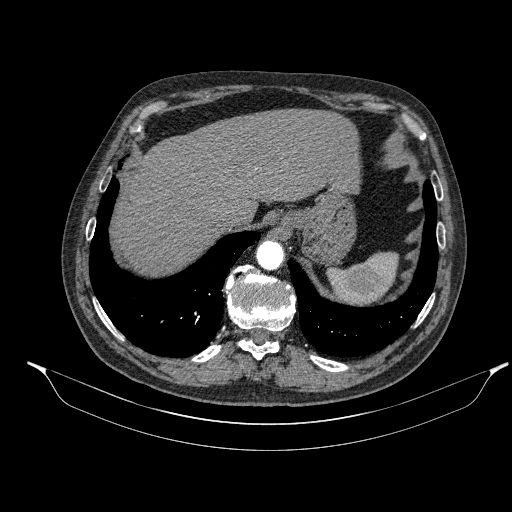
[im 397/477  lung]
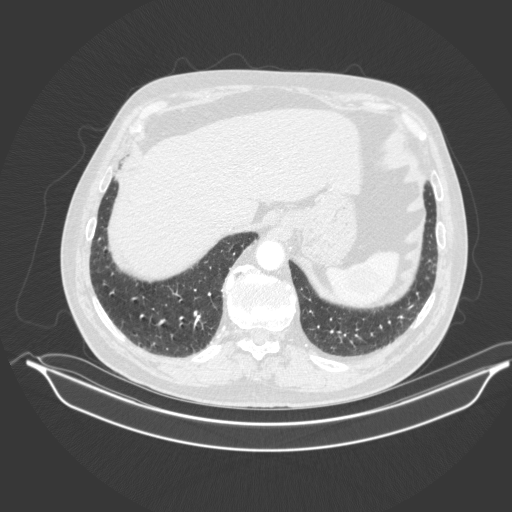
[im 424/477  lung]
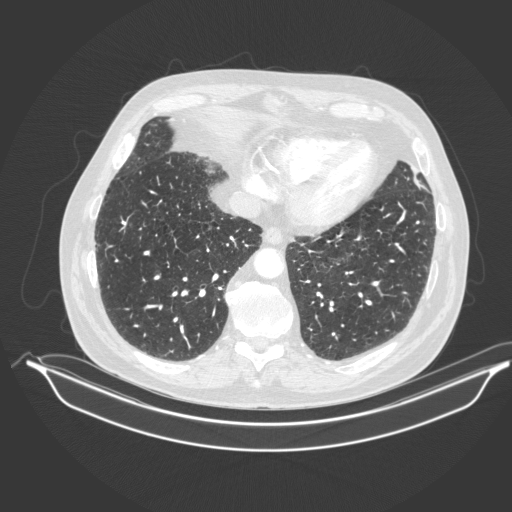
[im 450/477  soft-tissue]
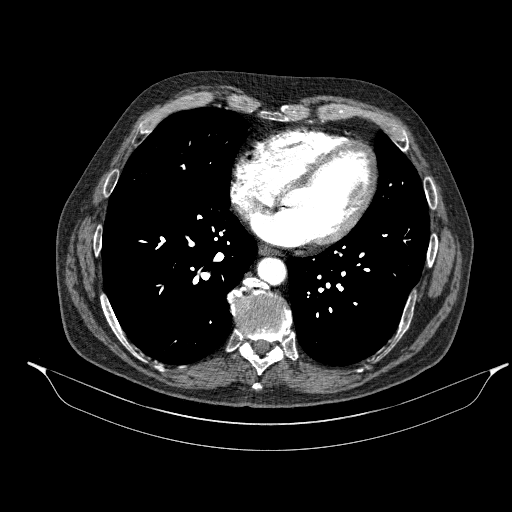
[im 450/477  lung]
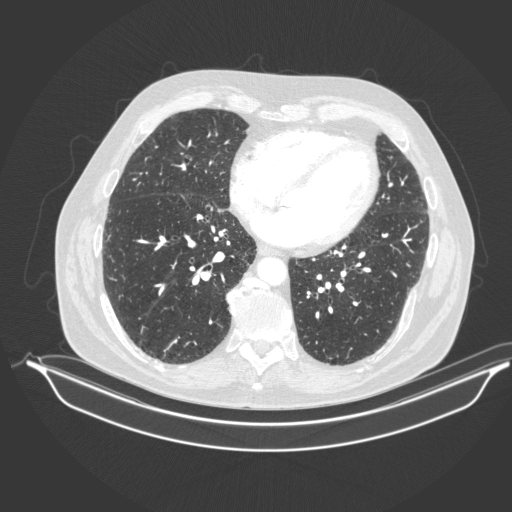

[12 of 32 positions shown; findings below may reference images not displayed]

FINDINGS: VASCULAR

Aorta: Infrarenal abdominal aortic aneurysm present with extremely
tortuous and angulated infrarenal neck. The neck measures
approximately 1.7-2.0 cm. Aneurysmal aorta reaches maximal
transverse dimensions of approximately 7.2 x 7.4 cm with maximal
oblique diameter of 7.4 cm. There is a rim of mural thrombus around
most of the circumference of the inferior aspect of the aneurysm.
The aneurysm extends to the aortic bifurcation and into the right
common iliac artery. No evidence of aneurysm rupture or aortic
dissection. The suprarenal abdominal aorta demonstrates
atherosclerosis without aneurysmal disease.

Celiac: No significant stenosis. Normal distal branch vessel anatomy
and patency.

SMA: No significant stenosis. Visualized distal branches are
normally patent.

Renals: Bilateral single renal arteries demonstrate normal patency
and no significant stenosis. Takeoff of the renal arteries are
tortuous and distorted secondary to the angulated aneurysm neck.

IMA: The IMA is patent.

Inflow: Significant aneurysmal disease of the entire right common
iliac artery reaches maximal caliber approximately 5.0-5.1 cm in the
mid common iliac artery. There is circumferential mural thrombus
within the right common iliac aneurysm. Mild aneurysmal disease of
the right internal iliac artery trunk measures approximately 1.4 cm.
The right external iliac artery demonstrates minimal atherosclerosis
and no evidence of aneurysmal disease or stenosis.

The proximal left common iliac artery demonstrates aneurysmal
disease with diameter of 2.3 cm. The mid segment tapers to a normal
caliber and the distal segment again dilates to a caliber of
approximately 2.2 cm. Scattered calcified plaque is present in the
internal and external iliac arteries without evidence of aneurysmal
disease or significant obstructive disease.

Proximal Outflow: Bilateral common femoral arteries and femoral
bifurcations demonstrate normal patency with the common femoral
arteries demonstrating mild calcified plaque.

Veins: Delayed phase demonstrates no venous abnormalities in the
visualized abdomen.

Review of the MIP images confirms the above findings.

NON-VASCULAR

Lower chest: Stable 7 mm nodule in the anterior aspect of the right
middle lobe.

Hepatobiliary: Probable small cyst in the posterior right lobe of
the liver measures roughly 9-10 mm. Otherwise unremarkable liver,
gallbladder and bile ducts.

Pancreas: Unremarkable. No pancreatic ductal dilatation or
surrounding inflammatory changes.

Spleen: Normal in size without focal abnormality.

Adrenals/Urinary Tract: Adrenal glands are unremarkable. Kidneys are
normal, without renal calculi, focal lesion, or hydronephrosis.
Scattered bilateral simple cysts of the kidneys. Bladder is
unremarkable.

Stomach/Bowel: Bowel shows no evidence of obstruction, ileus or
inflammation. Scattered diverticulosis without evidence of
diverticulitis.

Lymphatic: No enlarged lymph nodes are identified in the abdomen or
pelvis.

Reproductive: Prostate is unremarkable.

Other: No abdominal wall hernia or abnormality. No abdominopelvic
ascites.

Musculoskeletal: No acute or significant osseous findings.
IMPRESSION: VASCULAR

1. Large non-ruptured infrarenal abdominal aortic aneurysm with
maximal dimensions of approximately 7.2 x 7.4 cm. The infrarenal
aneurysm neck is extremely angulated and tortuous.
2. Significant aneurysmal disease extends into the right common
iliac artery with maximal diameter of 5.0-5.1 cm.
3. Mild aneurysmal disease of the right internal iliac artery
measuring 1.4 cm.
4. Aneurysmal disease of the left common iliac artery measuring up
to 2.3 cm.

NON-VASCULAR

Stable 7 mm right middle lobe lung nodule.
